# Patient Record
Sex: Male | Born: 1945 | Race: White | Hispanic: No | Smoking: Current every day smoker
Health system: Southern US, Community
[De-identification: ages and names within clinical notes are randomized; demographics above are authoritative.]

## PROBLEM LIST (undated history)

## (undated) DIAGNOSIS — M1990 Unspecified osteoarthritis, unspecified site: Secondary | ICD-10-CM

## (undated) HISTORY — PX: SHOULDER SURGERY: SHX246

## (undated) HISTORY — PX: TONSILLECTOMY: SUR1361

---

## 2001-06-18 ENCOUNTER — Emergency Department (HOSPITAL_COMMUNITY): Admission: EM | Admit: 2001-06-18 | Discharge: 2001-06-18 | Payer: Self-pay | Admitting: Emergency Medicine

## 2011-01-09 ENCOUNTER — Encounter: Payer: Self-pay | Admitting: *Deleted

## 2011-01-09 ENCOUNTER — Emergency Department (HOSPITAL_COMMUNITY)
Admission: EM | Admit: 2011-01-09 | Discharge: 2011-01-09 | Disposition: A | Discharge status: Home / Self Care | Payer: Worker's Compensation | Attending: Emergency Medicine | Admitting: Emergency Medicine

## 2011-01-09 DIAGNOSIS — M545 Low back pain, unspecified: Secondary | ICD-10-CM | POA: Insufficient documentation

## 2011-01-09 DIAGNOSIS — S335XXA Sprain of ligaments of lumbar spine, initial encounter: Secondary | ICD-10-CM | POA: Insufficient documentation

## 2011-01-09 DIAGNOSIS — S39012A Strain of muscle, fascia and tendon of lower back, initial encounter: Secondary | ICD-10-CM

## 2011-01-09 DIAGNOSIS — X58XXXA Exposure to other specified factors, initial encounter: Secondary | ICD-10-CM | POA: Insufficient documentation

## 2011-01-09 LAB — URINALYSIS, ROUTINE W REFLEX MICROSCOPIC
Nitrite: NEGATIVE
Protein, ur: 30 mg/dL — AB
Urobilinogen, UA: 1 mg/dL (ref 0.0–1.0)

## 2011-01-09 MED ORDER — HYDROCODONE-ACETAMINOPHEN 5-325 MG PO TABS: 1.0000 | ORAL_TABLET | Freq: Four times a day (QID) | ORAL | Status: AC | PRN

## 2011-01-09 MED ORDER — CYCLOBENZAPRINE HCL 10 MG PO TABS: 10.0000 mg | ORAL_TABLET | Freq: Two times a day (BID) | ORAL | Status: AC | PRN

## 2011-01-09 NOTE — ED Provider Notes (Signed)
History   This chart was scribed for Shelda Jakes, MD by Clarita Crane. The patient was seen in room APFT21/APFT21 and the patient's care was started at 12:23pm.   CSN: [id number]  Arrival date & time 01/09/11  3664   First MD Initiated Contact with Patient 01/09/11 1048      Chief Complaint  Patient presents with  . Back Pain    (Consider location/radiation/quality/duration/timing/severity/associated sxs/prior treatment) HPI Benjamin Carlson is a 65 y.o. male who presents to the Emergency Department complaining of moderate lower back pain that has been constant the last few days and became worse this morning. Pt ranks pain 7/10 at worst.  Pt claims being ambulatory or sitting makes it worse and standing relives the pain. Pt denies pain radiating into lower extremities, abdominal pain, numbness in legs, and swelling. Pt confirms h/o chronic mild hip pain.   History reviewed. No pertinent past medical history.  Past Surgical History  Procedure Date  . Shoulder surgery     History reviewed. No pertinent family history.  History  Substance Use Topics  . Smoking status: Current Everyday Smoker -- 1.0 packs/day  . Smokeless tobacco: Not on file  . Alcohol Use: Yes     occasionally      Review of Systems  Constitutional: Negative for fever and chills.  HENT: Negative for rhinorrhea and neck pain.   Eyes: Negative for pain.  Respiratory: Negative for cough and shortness of breath.   Cardiovascular: Negative for chest pain.  Gastrointestinal: Negative for nausea, vomiting, abdominal pain and diarrhea.  Genitourinary: Negative for dysuria.  Musculoskeletal: Positive for back pain.  Skin: Negative for rash.  Neurological: Negative for dizziness and weakness.   10 Systems reviewed and are negative for acute change except as noted in the HPI.  Allergies  Review of patient's allergies indicates no known allergies.  Home Medications   Current Outpatient Rx  Name Route  Sig Dispense Refill  . NEPHRO-VITE 0.8 MG PO TABS Oral Take 0.8 mg by mouth at bedtime.      Marland Kitchen VITAMIN B-1 100 MG PO TABS Oral Take 100 mg by mouth daily.      . CYCLOBENZAPRINE HCL 10 MG PO TABS Oral Take 1 tablet (10 mg total) by mouth 2 (two) times daily as needed for muscle spasms. 20 tablet 0  . HYDROCODONE-ACETAMINOPHEN 5-325 MG PO TABS Oral Take 1-2 tablets by mouth every 6 (six) hours as needed for pain. 10 tablet 0    BP 146/72  Pulse 81  Temp(Src) 97.7 F (36.5 C) (Oral)  Resp 18  Ht 5\' 11"  (1.803 m)  Wt 195 lb (88.451 kg)  BMI 27.20 kg/m2  SpO2 100%  Physical Exam  Nursing note and vitals reviewed. Constitutional: He is oriented to person, place, and time. He appears well-developed and well-nourished. No distress.  HENT:  Head: Normocephalic and atraumatic.  Right Ear: External ear normal.  Left Ear: External ear normal.  Mouth/Throat: Oropharynx is clear and moist.  Eyes: Conjunctivae are normal. Pupils are equal, round, and reactive to light.  Neck: Neck supple.  Cardiovascular: Normal rate, regular rhythm and normal heart sounds.   Pulmonary/Chest: Effort normal and breath sounds normal.  Abdominal: Soft. Bowel sounds are normal. There is no tenderness.  Musculoskeletal: Normal range of motion. He exhibits no tenderness.       Paraspinal muscles non-tender.   Neurological: He is alert and oriented to person, place, and time. No cranial nerve deficit. He exhibits normal muscle  tone. Coordination normal.  Skin: Skin is warm and dry.  Psychiatric: He has a normal mood and affect. His behavior is normal.    ED Course  Procedures (including critical care time)  DIAGNOSTIC STUDIES: Oxygen Saturation is 100% on RA, nml by my interpretation.    COORDINATION OF CARE:    Labs Reviewed  URINALYSIS, ROUTINE W REFLEX MICROSCOPIC - Abnormal; Notable for the following:    Specific Gravity, Urine >1.030 (*)    Bilirubin Urine MODERATE (*)    Ketones, ur TRACE (*)     Protein, ur 30 (*)    All other components within normal limits  URINE MICROSCOPIC-ADD ON   Results for orders placed during the hospital encounter of 01/09/11  URINALYSIS, ROUTINE W REFLEX MICROSCOPIC      Component Value Range   Color, Urine YELLOW  YELLOW    APPearance CLEAR  CLEAR    Specific Gravity, Urine >1.030 (*) 1.005 - 1.030    pH 6.0  5.0 - 8.0    Glucose, UA NEGATIVE  NEGATIVE (mg/dL)   Hgb urine dipstick NEGATIVE  NEGATIVE    Bilirubin Urine MODERATE (*) NEGATIVE    Ketones, ur TRACE (*) NEGATIVE (mg/dL)   Protein, ur 30 (*) NEGATIVE (mg/dL)   Urobilinogen, UA 1.0  0.0 - 1.0 (mg/dL)   Nitrite NEGATIVE  NEGATIVE    Leukocytes, UA NEGATIVE  NEGATIVE   URINE MICROSCOPIC-ADD ON      Component Value Range   WBC, UA 0-2  <3 (WBC/hpf)   RBC / HPF 0-2  <3 (RBC/hpf)   Urine-Other MUCOUS PRESENT       1. Lumbar strain       MDM   Symptoms consistent with mild lumbar strain low back pain. No lower trimming the focal deficits no radiation of pain into legs. No history of similar problem. No acute injury.  I personally performed the services described in this documentation, which was scribed in my presence. The recorded information has been reviewed and considered.     Shelda Jakes, MD 01/09/11 [phone]

## 2011-01-09 NOTE — ED Notes (Signed)
Pt c/o lower back pain x 2 days. Denies injury or Urinary symptoms.

## 2011-06-25 ENCOUNTER — Encounter (HOSPITAL_COMMUNITY): Payer: Self-pay | Admitting: *Deleted

## 2011-06-25 ENCOUNTER — Emergency Department (HOSPITAL_COMMUNITY)
Admission: EM | Admit: 2011-06-25 | Discharge: 2011-06-25 | Disposition: A | Discharge status: Home / Self Care | Payer: Self-pay | Attending: Emergency Medicine | Admitting: Emergency Medicine

## 2011-06-25 ENCOUNTER — Emergency Department (HOSPITAL_COMMUNITY): Payer: Self-pay

## 2011-06-25 DIAGNOSIS — S61509A Unspecified open wound of unspecified wrist, initial encounter: Secondary | ICD-10-CM | POA: Insufficient documentation

## 2011-06-25 DIAGNOSIS — Y93H9 Activity, other involving exterior property and land maintenance, building and construction: Secondary | ICD-10-CM | POA: Insufficient documentation

## 2011-06-25 DIAGNOSIS — W268XXA Contact with other sharp object(s), not elsewhere classified, initial encounter: Secondary | ICD-10-CM | POA: Insufficient documentation

## 2011-06-25 DIAGNOSIS — Y998 Other external cause status: Secondary | ICD-10-CM | POA: Insufficient documentation

## 2011-06-25 DIAGNOSIS — S51819A Laceration without foreign body of unspecified forearm, initial encounter: Secondary | ICD-10-CM

## 2011-06-25 DIAGNOSIS — F17200 Nicotine dependence, unspecified, uncomplicated: Secondary | ICD-10-CM | POA: Insufficient documentation

## 2011-06-25 MED ORDER — CEPHALEXIN 500 MG PO CAPS
500.0000 mg | ORAL_CAPSULE | Freq: Once | ORAL | Status: AC
  Administered 2011-06-25: 500 mg via ORAL
  Filled 2011-06-25: qty 1

## 2011-06-25 MED ORDER — HYDROCODONE-ACETAMINOPHEN 5-325 MG PO TABS: ORAL_TABLET | ORAL | Status: AC

## 2011-06-25 MED ORDER — CEPHALEXIN 500 MG PO CAPS: 500.0000 mg | ORAL_CAPSULE | Freq: Four times a day (QID) | ORAL | Status: AC

## 2011-06-25 MED ORDER — TETANUS-DIPHTH-ACELL PERTUSSIS 5-2.5-18.5 LF-MCG/0.5 IM SUSP
0.5000 mL | Freq: Once | INTRAMUSCULAR | Status: AC
  Administered 2011-06-25: 0.5 mL via INTRAMUSCULAR
  Filled 2011-06-25: qty 0.5

## 2011-06-25 MED ORDER — LIDOCAINE HCL (PF) 1 % IJ SOLN
5.0000 mL | Freq: Once | INTRAMUSCULAR | Status: AC
  Administered 2011-06-25: 5 mL via INTRADERMAL

## 2011-06-25 MED ORDER — LIDOCAINE HCL (PF) 1 % IJ SOLN
INTRAMUSCULAR | Status: AC
  Administered 2011-06-25: 5 mL via INTRADERMAL
  Filled 2011-06-25: qty 5

## 2011-06-25 NOTE — ED Notes (Signed)
Cleaned wound with wound cleanser.  Bleeding controlled.  nad noted.

## 2011-06-25 NOTE — ED Notes (Signed)
Pt states that lawn mower fell on his arm, large gash to right forearm, bleeding controlled at this time

## 2011-06-25 NOTE — ED Provider Notes (Signed)
History     CSN: [id number]  Arrival date & time 06/25/11  1250   First MD Initiated Contact with Patient 06/25/11 1336      Chief Complaint  Patient presents with  . Extremity Laceration    (Consider location/radiation/quality/duration/timing/severity/associated sxs/prior treatment) HPI Comments: Patient c/o laceration to his right forearm that occurred when his lawn mower turned over and the gear knob lacerated his arm.  Mower was not turned on at the time of the incident.  Patient denies other injuries, numbness of his arm or fingers.  Also denies weakness of the fingers.  Unsure of last tetanus injection  Patient is a 66 y.o. male presenting with skin laceration. The history is provided by the patient.  Laceration  The incident occurred 1 to 2 hours ago. The laceration is located on the right arm. The laceration is 4 cm in size. The laceration mechanism was a a metal edge. The pain is mild. The pain has been constant since onset. He reports no foreign bodies present. His tetanus status is unknown.    History reviewed. No pertinent past medical history.  Past Surgical History  Procedure Date  . Shoulder surgery     History reviewed. No pertinent family history.  History  Substance Use Topics  . Smoking status: Current Everyday Smoker -- 1.0 packs/day  . Smokeless tobacco: Not on file  . Alcohol Use: Yes     occasionally      Review of Systems  Constitutional: Negative for fever and chills.  Respiratory: Negative for shortness of breath.   Cardiovascular: Negative for chest pain.  Gastrointestinal: Negative for abdominal pain.  Genitourinary: Negative for dysuria and difficulty urinating.  Musculoskeletal: Negative for joint swelling and arthralgias.  Skin: Positive for wound. Negative for color change.  Neurological: Negative for weakness and numbness.  All other systems reviewed and are negative.    Allergies  Review of patient's allergies indicates no known  allergies.  Home Medications   Current Outpatient Rx  Name Route Sig Dispense Refill  . VITAMIN C PO Oral Take 1 tablet by mouth daily.    . ASPIRIN EC 81 MG PO TBEC Oral Take 81 mg by mouth daily.    Marland Kitchen NEPHRO-VITE 0.8 MG PO TABS Oral Take 0.8 mg by mouth at bedtime.      Marland Kitchen VITAMIN D PO Oral Take 2 tablets by mouth daily.    . IBUPROFEN 200 MG PO TABS Oral Take 400-600 mg by mouth every 6 (six) hours as needed. For pain    . ADULT MULTIVITAMIN W/MINERALS CH Oral Take 1 tablet by mouth daily.    Marland Kitchen JOINT FORMULA PO Oral Take 2 tablets by mouth daily.    Marland Kitchen VITAMIN B-1 100 MG PO TABS Oral Take 100 mg by mouth daily.        BP 133/77  Pulse 88  Temp(Src) 97.9 F (36.6 C) (Oral)  Resp 18  Ht 5\' 9"  (1.753 m)  Wt 190 lb (86.183 kg)  BMI 28.06 kg/m2  SpO2 97%  Physical Exam  Nursing note and vitals reviewed. Constitutional: He is oriented to person, place, and time. He appears well-developed and well-nourished. No distress.  HENT:  Head: Normocephalic and atraumatic.  Cardiovascular: Normal rate, regular rhythm and normal heart sounds.   Pulmonary/Chest: Effort normal and breath sounds normal.  Musculoskeletal: He exhibits tenderness. He exhibits no edema.       Right forearm: He exhibits tenderness and laceration. He exhibits no bony tenderness, no swelling,  no edema and no deformity.       Arms:      Pt has full ROM of the wrist and all fingers of the right hand.  distal sensation intact, radial pulse is brisk.  CR<2 sec. Grip strength is strong and equal.  Finger/thumb opposition is intact  Neurological: He is alert and oriented to person, place, and time. He exhibits normal muscle tone. Coordination normal.    ED Course  Procedures (including critical care time)  Labs Reviewed - No data to display Dg Forearm Right  06/25/2011  *RADIOLOGY REPORT*  Clinical Data: Laceration  RIGHT FOREARM - 2 VIEW  Comparison: None.  Findings: There is a soft tissue laceration with some venous  gas along the ulnar and volar side of the mid forearm.  The radius and ulna are intact.  No acute or healing fracture.  IMPRESSION: Soft tissue laceration of the forearm.  No acute bony abnormality.  Original Report Authenticated By: Britta Mccreedy, M.D.        MDM    Consulted Dr. Hilda Lias, will see in his office tomorrow am  LACERATION REPAIR Performed by: Noraa Pickeral L. Authorized by: Maxwell Caul Consent: Verbal consent obtained. Risks and benefits: risks, benefits and alternatives were discussed Consent given by: patient Patient identity confirmed: provided demographic data Prepped and Draped in normal sterile fashion Wound explored  Laceration Location: volar surface of the right forearm Laceration Length: 4 cm  No Foreign Bodies seen or palpated  Anesthesia: local infiltration  Local anesthetic: lidocaine 1% w/o epinephrine  Anesthetic total: 4 ml  Irrigation method: syringe Amount of cleaning: standard  Skin closure: 4-0 ethilon Number of sutures: 3, closed loosely Technique:simple interrupted   Patient tolerance: Patient tolerated the procedure well with no immediate complications.    Wound(s) explored with adequate hemostasis through ROM, no apparent gross foreign body retained, no significant involvement of deep structures such as bone / joint / tendon / or neurovascular involvement noted.  Baseline Strength and Sensation to affected extremity(ies) with normal light touch for Pt, distal NVI with CR< 2 secs and pulse(s) intact to affected extremity(ies).   Patient / Family / Caregiver understand and agree with initial ED impression and plan with expectations set for ED visit. Pt stable in ED with no significant deterioration in condition. Pt feels improved after observation and/or treatment in ED.     Prescribed:  Norco#20 Keflex   Ralston Venus L. Declin Rajan, [place] 06/30/11 1327

## 2011-07-01 NOTE — ED Provider Notes (Signed)
Medical screening examination/treatment/procedure(s) were performed by non-physician practitioner and as supervising physician I was immediately available for consultation/collaboration.  Gabriell Daigneault, MD 07/01/11 1314 

## 2012-05-09 ENCOUNTER — Telehealth: Payer: Self-pay

## 2012-05-09 NOTE — Telephone Encounter (Signed)
Pt was referred by Dr. Regino Schultze for screening colonoscopy. Called, could not leave VM, not set up.

## 2012-05-21 ENCOUNTER — Other Ambulatory Visit: Payer: Self-pay

## 2012-05-21 ENCOUNTER — Telehealth: Payer: Self-pay

## 2012-05-21 DIAGNOSIS — Z1211 Encounter for screening for malignant neoplasm of colon: Secondary | ICD-10-CM

## 2012-05-21 NOTE — Telephone Encounter (Signed)
Called pt and he said that he will call me back shortly.

## 2012-05-22 NOTE — Telephone Encounter (Signed)
OK to schedule

## 2012-05-22 NOTE — Telephone Encounter (Signed)
See triage dated 05/21/2012.

## 2012-05-22 NOTE — Telephone Encounter (Signed)
Gastroenterology Pre-Procedure Form     Request Date: 05/21/2012      Requesting Physician: Dr. Regino Schultze      PATIENT INFORMATION:  Benjamin Carlson is a 67 y.o., male (DOB=1945/04/02).  PROCEDURE: Procedure(s) requested: colonoscopy Procedure Reason: screening for colon cancer  PATIENT REVIEW QUESTIONS: The patient reports the following:   1. Diabetes Melitis: no 2. Joint replacements in the past 12 months: no 3. Major health problems in the past 3 months: no 4. Has an artificial valve or MVP:no 5. Has been advised in past to take antibiotics in advance of a procedure like teeth cleaning: no}    MEDICATIONS & ALLERGIES:    Patient reports the following regarding taking any blood thinners:   Plavix? no Aspirin? YES Coumadin?  no  Patient confirms/reports the following medications:  Current Outpatient Prescriptions  Medication Sig Dispense Refill  . Ascorbic Acid (VITAMIN C PO) Take 1,000 mg by mouth daily. Takes two tablets daily      . aspirin EC 81 MG tablet Take 81 mg by mouth daily.      Marland Kitchen buPROPion (WELLBUTRIN SR) 100 MG 12 hr tablet Take 100 mg by mouth 2 (two) times daily.      Marland Kitchen ibuprofen (ADVIL,MOTRIN) 200 MG tablet Take 400-600 mg by mouth every 6 (six) hours as needed. For pain      . Multiple Vitamin (MULTIVITAMIN WITH MINERALS) TABS Take 1 tablet by mouth daily.      . naproxen (NAPROSYN) 500 MG tablet Take 500 mg by mouth 2 (two) times daily with a meal.      . NON FORMULARY Vitamin D3  1000 mg   2 tablets daily      . vitamin B-12 (CYANOCOBALAMIN) 500 MCG tablet Take 500 mcg by mouth 2 (two) times daily.      . vitamin E 1000 UNIT capsule Take 1,000 Units by mouth 2 (two) times daily.      . Nutritional Supplements (JOINT FORMULA PO) Take 2 tablets by mouth daily.       No current facility-administered medications for this visit.    Patient confirms/reports the following allergies:  No Known Allergies  Patient is appropriate to schedule for requested  procedure(s): yes  AUTHORIZATION INFORMATION Primary Insurance:   ID #:  Group #:  Pre-Cert / Auth required: Pre-Cert / Auth #:  Secondary Insurance:   ID #:   Group #:  Pre-Cert / Auth required: Pre-Cert / Auth #:   No orders of the defined types were placed in this encounter.    SCHEDULE INFORMATION: Procedure has been scheduled as follows:  Date: 06/05/2012  , Time: 12:45 PM  Location: [facility] Short Stay  This Gastroenterology Pre-Precedure Form is being routed to the following provider(s) for review: R. Roetta Sessions, MD

## 2012-05-23 ENCOUNTER — Encounter (HOSPITAL_COMMUNITY): Payer: Self-pay | Admitting: Pharmacy Technician

## 2012-05-27 MED ORDER — PEG-KCL-NACL-NASULF-NA ASC-C 100 G PO SOLR: 1.0000 | ORAL | Status: DC

## 2012-05-27 NOTE — Telephone Encounter (Signed)
Rx sent to the pharmacy and instructions mailed to pt.  

## 2012-06-04 ENCOUNTER — Telehealth: Payer: Self-pay

## 2012-06-04 NOTE — Telephone Encounter (Signed)
pts girlfriend called- pt didn't follow prep instructions. He just ate a large meal of chicken, fish, french fries and slaw. They are wanting to reschedule his procedure. They have not mixed his prep yet. And are concerned that he wont get cleaned out. They prefer to just cancel and reschedule. Routing to DS to reschedule.  [phone]

## 2012-06-04 NOTE — Telephone Encounter (Signed)
Pt is rescheduled for 06/06/2012 at 2:45 and will be at the hospital at 1:45 PM to register. LMOM for Kim .

## 2012-06-05 MED ORDER — SODIUM CHLORIDE 0.9 % IV SOLN
INTRAVENOUS | Status: DC
  Administered 2012-06-06: 1000 mL via INTRAVENOUS

## 2012-06-06 ENCOUNTER — Ambulatory Visit (HOSPITAL_COMMUNITY)
Admission: RE | Admit: 2012-06-06 | Discharge: 2012-06-06 | Disposition: A | Discharge status: Home / Self Care | Payer: Medicare Other | Source: Ambulatory Visit | Attending: Internal Medicine | Admitting: Internal Medicine

## 2012-06-06 ENCOUNTER — Encounter (HOSPITAL_COMMUNITY)
Admission: RE | Disposition: A | Discharge status: Home / Self Care | Payer: Self-pay | Source: Ambulatory Visit | Attending: Internal Medicine

## 2012-06-06 ENCOUNTER — Encounter (HOSPITAL_COMMUNITY): Payer: Self-pay | Admitting: *Deleted

## 2012-06-06 DIAGNOSIS — D126 Benign neoplasm of colon, unspecified: Secondary | ICD-10-CM | POA: Insufficient documentation

## 2012-06-06 DIAGNOSIS — K621 Rectal polyp: Secondary | ICD-10-CM

## 2012-06-06 DIAGNOSIS — D129 Benign neoplasm of anus and anal canal: Secondary | ICD-10-CM | POA: Diagnosis not present

## 2012-06-06 DIAGNOSIS — Z1211 Encounter for screening for malignant neoplasm of colon: Secondary | ICD-10-CM | POA: Diagnosis not present

## 2012-06-06 DIAGNOSIS — M129 Arthropathy, unspecified: Secondary | ICD-10-CM | POA: Insufficient documentation

## 2012-06-06 DIAGNOSIS — Z79899 Other long term (current) drug therapy: Secondary | ICD-10-CM | POA: Insufficient documentation

## 2012-06-06 DIAGNOSIS — Z791 Long term (current) use of non-steroidal anti-inflammatories (NSAID): Secondary | ICD-10-CM | POA: Diagnosis not present

## 2012-06-06 DIAGNOSIS — Z7982 Long term (current) use of aspirin: Secondary | ICD-10-CM | POA: Insufficient documentation

## 2012-06-06 DIAGNOSIS — K5730 Diverticulosis of large intestine without perforation or abscess without bleeding: Secondary | ICD-10-CM

## 2012-06-06 DIAGNOSIS — K620 Anal polyp: Secondary | ICD-10-CM | POA: Diagnosis not present

## 2012-06-06 DIAGNOSIS — F17200 Nicotine dependence, unspecified, uncomplicated: Secondary | ICD-10-CM | POA: Insufficient documentation

## 2012-06-06 HISTORY — DX: Unspecified osteoarthritis, unspecified site: M19.90

## 2012-06-06 HISTORY — PX: COLONOSCOPY: SHX5424

## 2012-06-06 SURGERY — COLONOSCOPY
Anesthesia: Moderate Sedation

## 2012-06-06 MED ORDER — MIDAZOLAM HCL 5 MG/5ML IJ SOLN
INTRAMUSCULAR | Status: DC | PRN
  Administered 2012-06-06: 2 mg via INTRAVENOUS
  Administered 2012-06-06: 1 mg via INTRAVENOUS
  Administered 2012-06-06: 2 mg via INTRAVENOUS

## 2012-06-06 MED ORDER — STERILE WATER FOR IRRIGATION IR SOLN
Status: DC | PRN
  Administered 2012-06-06: 15:00:00

## 2012-06-06 MED ORDER — ONDANSETRON HCL 4 MG/2ML IJ SOLN
INTRAMUSCULAR | Status: AC
  Filled 2012-06-06: qty 2

## 2012-06-06 MED ORDER — ONDANSETRON HCL 4 MG/2ML IJ SOLN
INTRAMUSCULAR | Status: DC | PRN
  Administered 2012-06-06: 4 mg via INTRAVENOUS

## 2012-06-06 MED ORDER — MEPERIDINE HCL 100 MG/ML IJ SOLN
INTRAMUSCULAR | Status: AC
  Filled 2012-06-06: qty 1

## 2012-06-06 MED ORDER — MEPERIDINE HCL 100 MG/ML IJ SOLN
INTRAMUSCULAR | Status: DC | PRN
  Administered 2012-06-06 (×2): 50 mg via INTRAVENOUS

## 2012-06-06 MED ORDER — MIDAZOLAM HCL 5 MG/5ML IJ SOLN
INTRAMUSCULAR | Status: AC
  Filled 2012-06-06: qty 10

## 2012-06-06 NOTE — Op Note (Signed)
[facility] [address] [place], [address]   COLONOSCOPY PROCEDURE REPORT  PATIENT: Benjamin Carlson, Benjamin Carlson  MR#:         [id number] BIRTHDATE: 12/17/1945 , 66  yrs. old GENDER: Male ENDOSCOPIST: R.  Roetta Sessions, MD FACP FACG REFERRED BY:  Karleen Hampshire, M.D. PROCEDURE DATE:  06/06/2012 PROCEDURE:     Colonoscopy with biopsy and snare polypectomy  INDICATIONS: First ever average risk colorectal cancer screening examination  INFORMED CONSENT:  The risks, benefits, alternatives and imponderables including but not limited to bleeding, perforation as well as the possibility of a missed lesion have been reviewed.  The potential for biopsy, lesion removal, etc. have also been discussed.  Questions have been answered.  All parties agreeable. Please see the history and physical in the medical record for more information.  MEDICATIONS: Versed 5 mg IV and Demerol 100 mg IV in divided doses. Zofran 4 mg  DESCRIPTION OF PROCEDURE:  After a digital rectal exam was performed, the EC-3890Li ([id number])  colonoscope was advanced from the anus through the rectum and colon to the area of the cecum, ileocecal valve and appendiceal orifice.  The cecum was deeply intubated.  These structures were well-seen and photographed for the record.  From the level of the cecum and ileocecal valve, the scope was slowly and cautiously withdrawn.  The mucosal surfaces were carefully surveyed utilizing scope tip deflection to facilitate fold flattening as needed.  The scope was pulled down into the rectum where a thorough examination including retroflexion was performed.    FINDINGS:  Adequate preparation. Single diminutive polyp in the rectum at 10 cm from the anal verge; otherwise, the remainder of colonic mucosa appeared normal. Patient few scattered left-sided diverticula; (1) 6 mm pedunculated polyp at the splenic flexure; otherwise, the remainder of colonic mucosa appeared  normal.  THERAPEUTIC / DIAGNOSTIC MANEUVERS PERFORMED:  The above-mentioned polyps were cold biopsied and hot snare/removed, respectively.  COMPLICATIONS: None  CECAL WITHDRAWAL TIME:  9 minutes  IMPRESSION:  Colonic diverticulosis. Colonic polyps-removed as described above  RECOMMENDATIONS: followup on pathology.   _______________________________ eSigned:  R. Roetta Sessions, MD FACP [facility] 06/06/2012 3:52 PM   CC:    PATIENT NAME:  Massie, Mees MR#: [id number]

## 2012-06-06 NOTE — H&P (Signed)
Primary Care Physician:  Kirk Ruths, MD Primary Gastroenterologist:  Dr. Jena Gauss  Pre-Procedure History & Physical: HPI:  Benjamin Carlson is a 67 y.o. male is here for a screening colonoscopy. No bowel symptoms. First screening examination. No family history of colon polyps or colon cancer  Past Medical History  Diagnosis Date  . Arthritis     Past Surgical History  Procedure Laterality Date  . Shoulder surgery    . Tonsillectomy      age 67    Prior to Admission medications   Medication Sig Start Date End Date Taking? Authorizing Provider  acetaminophen (TYLENOL) 500 MG tablet Take 1,000 mg by mouth every 6 (six) hours as needed for pain.   Yes Historical Provider, MD  Ascorbic Acid (VITAMIN C PO) Take 1,000 mg by mouth daily. Takes two tablets daily   Yes Historical Provider, MD  aspirin EC 81 MG tablet Take 81 mg by mouth daily.   Yes Historical Provider, MD  buPROPion (WELLBUTRIN SR) 100 MG 12 hr tablet Take 100 mg by mouth 2 (two) times daily.   Yes Historical Provider, MD  ibuprofen (ADVIL,MOTRIN) 200 MG tablet Take 400-600 mg by mouth every 6 (six) hours as needed. For pain   Yes Historical Provider, MD  Multiple Vitamin (MULTIVITAMIN WITH MINERALS) TABS Take 1 tablet by mouth daily.   Yes Historical Provider, MD  naproxen (NAPROSYN) 500 MG tablet Take 500 mg by mouth 2 (two) times daily with a meal.   Yes Historical Provider, MD  NON FORMULARY Vitamin D3  1000 mg   2 tablets daily   Yes Historical Provider, MD  Nutritional Supplements (JOINT FORMULA PO) Take 2 tablets by mouth daily.   Yes Historical Provider, MD  peg 3350 powder (MOVIPREP) 100 G SOLR Take 1 kit (100 g total) by mouth as directed. 05/27/12  Yes Corbin Ade, MD  vitamin B-12 (CYANOCOBALAMIN) 500 MCG tablet Take 500 mcg by mouth 2 (two) times daily.   Yes Historical Provider, MD  vitamin E 1000 UNIT capsule Take 1,000 Units by mouth 2 (two) times daily.   Yes Historical Provider, MD    Allergies as of  05/21/2012  . (No Known Allergies)    Family History  Problem Relation Age of Onset  . Breast cancer Mother   . Cancer - Other Father   . Lung cancer Sister   . Lung cancer Brother   . Cancer - Other Brother     History   Social History  . Marital Status: Single    Spouse Name: N/A    Number of Children: N/A  . Years of Education: N/A   Occupational History  . Not on file.   Social History Main Topics  . Smoking status: Current Every Day Smoker -- 1.00 packs/day    Types: Cigarettes  . Smokeless tobacco: Not on file  . Alcohol Use: Yes     Comment: occasionally  . Drug Use: No  . Sexually Active: Not on file   Other Topics Concern  . Not on file   Social History Narrative  . No narrative on file    Review of Systems: See HPI, otherwise negative ROS  Physical Exam: BP 122/70  Pulse 81  Temp(Src) 97.9 F (36.6 C) (Oral)  Resp 18  Ht 5\' 9"  (1.753 m)  Wt 200 lb (90.719 kg)  BMI 29.52 kg/m2  SpO2 96% General:   Alert,  Well-developed, well-nourished, pleasant and cooperative in NAD Head:  Normocephalic and atraumatic.  Eyes:  Sclera clear, no icterus.   Conjunctiva pink. Ears:  Normal auditory acuity. Nose:  No deformity, discharge,  or lesions. Mouth:  No deformity or lesions, dentition normal. Neck:  Supple; no masses or thyromegaly. Lungs:  Clear throughout to auscultation.   No wheezes, crackles, or rhonchi. No acute distress. Heart:  Regular rate and rhythm; no murmurs, clicks, rubs,  or gallops. Abdomen:  Soft, nontender and nondistended. No masses, hepatosplenomegaly or hernias noted. Normal bowel sounds, without guarding, and without rebound.   Msk:  Symmetrical without gross deformities. Normal posture. Pulses:  Normal pulses noted. Extremities:  Without clubbing or edema. Neurologic:  Alert and  oriented x4;  grossly normal neurologically. Skin:  Intact without significant lesions or rashes. Cervical Nodes:  No significant cervical  adenopathy. Psych:  Alert and cooperative. Normal mood and affect.  Impression/Plan: Benjamin Carlson is now here to undergo a screening colonoscopy.   First ever average risk screening examination.  Risks, benefits, limitations, imponderables and alternatives regarding colonoscopy have been reviewed with the patient. Questions have been answered. All parties agreeable.

## 2012-06-11 ENCOUNTER — Encounter: Payer: Self-pay | Admitting: Internal Medicine

## 2012-06-11 ENCOUNTER — Encounter (HOSPITAL_COMMUNITY): Payer: Self-pay | Admitting: Internal Medicine

## 2012-08-12 DIAGNOSIS — Z6826 Body mass index (BMI) 26.0-26.9, adult: Secondary | ICD-10-CM | POA: Diagnosis not present

## 2012-08-12 DIAGNOSIS — G4700 Insomnia, unspecified: Secondary | ICD-10-CM | POA: Diagnosis not present

## 2012-08-12 DIAGNOSIS — F528 Other sexual dysfunction not due to a substance or known physiological condition: Secondary | ICD-10-CM | POA: Diagnosis not present

## 2012-08-12 DIAGNOSIS — Z23 Encounter for immunization: Secondary | ICD-10-CM | POA: Diagnosis not present

## 2012-11-06 DIAGNOSIS — K460 Unspecified abdominal hernia with obstruction, without gangrene: Secondary | ICD-10-CM | POA: Diagnosis not present

## 2012-11-06 DIAGNOSIS — Z23 Encounter for immunization: Secondary | ICD-10-CM | POA: Diagnosis not present

## 2012-11-06 DIAGNOSIS — Z6827 Body mass index (BMI) 27.0-27.9, adult: Secondary | ICD-10-CM | POA: Diagnosis not present

## 2012-11-06 DIAGNOSIS — G589 Mononeuropathy, unspecified: Secondary | ICD-10-CM | POA: Diagnosis not present

## 2012-11-12 ENCOUNTER — Other Ambulatory Visit (HOSPITAL_COMMUNITY): Payer: Self-pay | Admitting: Family Medicine

## 2012-11-12 DIAGNOSIS — K460 Unspecified abdominal hernia with obstruction, without gangrene: Secondary | ICD-10-CM

## 2012-11-14 ENCOUNTER — Ambulatory Visit (HOSPITAL_COMMUNITY)
Admission: RE | Admit: 2012-11-14 | Discharge: 2012-11-14 | Disposition: A | Discharge status: Home / Self Care | Payer: Medicare Other | Source: Ambulatory Visit | Attending: Family Medicine | Admitting: Family Medicine

## 2012-11-14 DIAGNOSIS — K5730 Diverticulosis of large intestine without perforation or abscess without bleeding: Secondary | ICD-10-CM | POA: Insufficient documentation

## 2012-11-14 DIAGNOSIS — K460 Unspecified abdominal hernia with obstruction, without gangrene: Secondary | ICD-10-CM

## 2012-11-14 DIAGNOSIS — K458 Other specified abdominal hernia without obstruction or gangrene: Secondary | ICD-10-CM | POA: Diagnosis not present

## 2012-11-25 DIAGNOSIS — H00019 Hordeolum externum unspecified eye, unspecified eyelid: Secondary | ICD-10-CM | POA: Diagnosis not present

## 2012-12-04 ENCOUNTER — Encounter: Payer: Self-pay | Admitting: Internal Medicine

## 2012-12-05 ENCOUNTER — Ambulatory Visit: Payer: Self-pay | Admitting: Gastroenterology

## 2012-12-31 ENCOUNTER — Ambulatory Visit: Payer: Self-pay | Admitting: Gastroenterology

## 2013-02-03 ENCOUNTER — Encounter: Payer: Self-pay | Admitting: Gastroenterology

## 2013-02-03 ENCOUNTER — Ambulatory Visit: Payer: Self-pay | Admitting: Gastroenterology

## 2013-02-03 ENCOUNTER — Telehealth: Payer: Self-pay | Admitting: Gastroenterology

## 2013-02-03 NOTE — Telephone Encounter (Signed)
Mailed letter and PCP is aware °

## 2013-02-03 NOTE — Telephone Encounter (Signed)
Please make sure referring provider aware of no show.

## 2013-02-03 NOTE — Telephone Encounter (Signed)
Pt was a no show

## 2013-06-03 ENCOUNTER — Encounter (INDEPENDENT_AMBULATORY_CARE_PROVIDER_SITE_OTHER): Payer: Self-pay

## 2013-06-03 ENCOUNTER — Ambulatory Visit (INDEPENDENT_AMBULATORY_CARE_PROVIDER_SITE_OTHER): Payer: Medicare Other | Admitting: Gastroenterology

## 2013-06-03 ENCOUNTER — Other Ambulatory Visit: Payer: Self-pay | Admitting: Internal Medicine

## 2013-06-03 ENCOUNTER — Encounter (HOSPITAL_COMMUNITY): Payer: Self-pay | Admitting: Pharmacy Technician

## 2013-06-03 ENCOUNTER — Encounter: Payer: Self-pay | Admitting: Gastroenterology

## 2013-06-03 VITALS — BP 163/87 | HR 67 | Temp 97.2°F | Ht 70.0 in | Wt 198.4 lb

## 2013-06-03 DIAGNOSIS — R933 Abnormal findings on diagnostic imaging of other parts of digestive tract: Secondary | ICD-10-CM | POA: Diagnosis not present

## 2013-06-03 DIAGNOSIS — Z860101 Personal history of adenomatous and serrated colon polyps: Secondary | ICD-10-CM | POA: Insufficient documentation

## 2013-06-03 DIAGNOSIS — Z86010 Personal history of colonic polyps: Secondary | ICD-10-CM | POA: Insufficient documentation

## 2013-06-03 NOTE — Assessment & Plan Note (Signed)
Due next colonoscopy in 05/2017.

## 2013-06-03 NOTE — Patient Instructions (Signed)
Upper endoscopy as scheduled. See separate instructions.  Please follow up with your family doctor regarding left leg pain.

## 2013-06-03 NOTE — Progress Notes (Signed)
Primary Care Physician:  Leonides Grills, MD  Primary Gastroenterologist:  Garfield Cornea, MD   Chief Complaint  Patient presents with  . EGD    HPI:  Benjamin Carlson is a 68 y.o. male here for further evaluation of abnormal CT abdomen and pelvis. CT was performed due to concern for left inguinal hernia, ordered by PCP. There was question of wall thickening versus underdistention artifact at proximal stomach.  Complains of left thigh pain at time of rest, especially when he lays down at night. No heartburn, dysphagia, abdominal pain, nausea/vomiting, weight loss. Good appetite. Uses advil, naproxen prn.   Current Outpatient Prescriptions  Medication Sig Dispense Refill  . acetaminophen (TYLENOL) 500 MG tablet Take 1,000 mg by mouth every 6 (six) hours as needed for pain.      . Ascorbic Acid (VITAMIN C PO) Take 1,000 mg by mouth daily. Takes two tablets daily      . aspirin EC 81 MG tablet Take 81 mg by mouth daily.      Marland Kitchen ibuprofen (ADVIL,MOTRIN) 200 MG tablet Take 400-600 mg by mouth every 6 (six) hours as needed. For pain      . Multiple Vitamin (MULTIVITAMIN WITH MINERALS) TABS Take 1 tablet by mouth daily.      . NON FORMULARY Vitamin D3  1000 mg   2 tablets daily      . Nutritional Supplements (JOINT FORMULA PO) Take 2 tablets by mouth daily.      . vitamin B-12 (CYANOCOBALAMIN) 500 MCG tablet Take 500 mcg by mouth 2 (two) times daily.      . vitamin E 1000 UNIT capsule Take 1,000 Units by mouth 2 (two) times daily.       No current facility-administered medications for this visit.    Allergies as of 06/03/2013  . (No Known Allergies)    Past Medical History  Diagnosis Date  . Arthritis     Past Surgical History  Procedure Laterality Date  . Shoulder surgery    . Tonsillectomy      age 51  . Colonoscopy N/A 06/06/2012    ION:GEXBMWU diverticulosis. Colonic polyps, tubular adenoma, hyperplastic.Next TCS 05/2017.    Family History  Problem Relation Age of Onset  .  Breast cancer Mother   . Cancer - Other Father   . Lung cancer Sister   . Lung cancer Brother   . Cancer - Other Brother     History   Social History  . Marital Status: Single    Spouse Name: N/A    Number of Children: N/A  . Years of Education: N/A   Occupational History  . Not on file.   Social History Main Topics  . Smoking status: Current Every Day Smoker -- 1.00 packs/day    Types: Cigarettes  . Smokeless tobacco: Not on file  . Alcohol Use: Yes     Comment: occasionally  . Drug Use: No  . Sexual Activity: Not on file   Other Topics Concern  . Not on file   Social History Narrative  . No narrative on file      ROS:  General: Negative for anorexia, weight loss, fever, chills, fatigue, weakness. Eyes: Negative for vision changes.  ENT: Negative for hoarseness, difficulty swallowing , nasal congestion. CV: Negative for chest pain, angina, palpitations, dyspnea on exertion, peripheral edema.  Respiratory: Negative for dyspnea at rest, dyspnea on exertion, cough, sputum, wheezing.  GI: See history of present illness. GU:  Negative for dysuria, hematuria, urinary  incontinence, urinary frequency, nocturnal urination.  MS: Chronic joint pain. No back pain.   Derm: Negative for rash or itching.  Neuro: Negative for weakness, abnormal sensation, seizure, frequent headaches, memory loss, confusion.  Psych: Negative for anxiety, depression, suicidal ideation, hallucinations.  Endo: Negative for unusual weight change.  Heme: Negative for bruising or bleeding. Allergy: Negative for rash or hives.    Physical Examination:  BP 163/87  Pulse 67  Temp(Src) 97.2 F (36.2 C) (Oral)  Ht 5\' 10"  (1.778 m)  Wt 198 lb 6.4 oz (89.994 kg)  BMI 28.47 kg/m2   General: Well-nourished, well-developed in no acute distress.  Head: Normocephalic, atraumatic.   Eyes: Conjunctiva pink, no icterus. Mouth: Oropharyngeal mucosa moist and pink , no lesions erythema or exudate. Neck:  Supple without thyromegaly, masses, or lymphadenopathy.  Lungs: Clear to auscultation bilaterally.  Heart: Regular rate and rhythm, no murmurs rubs or gallops.  Abdomen: Bowel sounds are normal, nontender, nondistended, no hepatosplenomegaly or masses, no abdominal bruits or    hernia , no rebound or guarding.   Rectal: not performed Extremities: No lower extremity edema. No clubbing or deformities.  Neuro: Alert and oriented x 4 , grossly normal neurologically.  Skin: Warm and dry, no rash or jaundice.   Psych: Alert and cooperative, normal mood and affect.

## 2013-06-03 NOTE — Assessment & Plan Note (Signed)
68 year old gentleman who presents for further evaluation of abnormal stomach seen on CT scan in October 2014. He had to cancel and/or missed multiple appointments since that time but presents today for followup. CT scan was done for possible hernia by his PCP. There was question of wall thickening versus underdistention artifact at the proximal stomach, EGD recommended. Patient denies any GI symptoms.  I have discussed the risks, alternatives, benefits with regards to but not limited to the risk of reaction to medication, bleeding, infection, perforation and the patient is agreeable to proceed. Written consent to be obtained.

## 2013-06-03 NOTE — Progress Notes (Signed)
cc'd to pcp 

## 2013-06-16 ENCOUNTER — Encounter (HOSPITAL_COMMUNITY): Payer: Self-pay | Admitting: *Deleted

## 2013-06-16 ENCOUNTER — Encounter (HOSPITAL_COMMUNITY)
Admission: RE | Disposition: A | Discharge status: Home / Self Care | Payer: Self-pay | Source: Ambulatory Visit | Attending: Internal Medicine

## 2013-06-16 ENCOUNTER — Ambulatory Visit (HOSPITAL_COMMUNITY)
Admission: RE | Admit: 2013-06-16 | Discharge: 2013-06-16 | Disposition: A | Discharge status: Home / Self Care | Payer: Medicare Other | Source: Ambulatory Visit | Attending: Internal Medicine | Admitting: Internal Medicine

## 2013-06-16 DIAGNOSIS — R933 Abnormal findings on diagnostic imaging of other parts of digestive tract: Secondary | ICD-10-CM

## 2013-06-16 DIAGNOSIS — K2940 Chronic atrophic gastritis without bleeding: Secondary | ICD-10-CM | POA: Insufficient documentation

## 2013-06-16 DIAGNOSIS — F17200 Nicotine dependence, unspecified, uncomplicated: Secondary | ICD-10-CM | POA: Diagnosis not present

## 2013-06-16 DIAGNOSIS — K279 Peptic ulcer, site unspecified, unspecified as acute or chronic, without hemorrhage or perforation: Secondary | ICD-10-CM | POA: Diagnosis not present

## 2013-06-16 DIAGNOSIS — K2960 Other gastritis without bleeding: Secondary | ICD-10-CM | POA: Diagnosis not present

## 2013-06-16 DIAGNOSIS — K319 Disease of stomach and duodenum, unspecified: Secondary | ICD-10-CM | POA: Diagnosis not present

## 2013-06-16 DIAGNOSIS — K269 Duodenal ulcer, unspecified as acute or chronic, without hemorrhage or perforation: Secondary | ICD-10-CM | POA: Diagnosis not present

## 2013-06-16 DIAGNOSIS — Z7982 Long term (current) use of aspirin: Secondary | ICD-10-CM | POA: Insufficient documentation

## 2013-06-16 DIAGNOSIS — K2990 Gastroduodenitis, unspecified, without bleeding: Secondary | ICD-10-CM | POA: Diagnosis not present

## 2013-06-16 DIAGNOSIS — K259 Gastric ulcer, unspecified as acute or chronic, without hemorrhage or perforation: Secondary | ICD-10-CM

## 2013-06-16 DIAGNOSIS — K2970 Gastritis, unspecified, without bleeding: Secondary | ICD-10-CM | POA: Diagnosis not present

## 2013-06-16 HISTORY — PX: ESOPHAGOGASTRODUODENOSCOPY: SHX5428

## 2013-06-16 SURGERY — EGD (ESOPHAGOGASTRODUODENOSCOPY)
Anesthesia: Moderate Sedation

## 2013-06-16 MED ORDER — MIDAZOLAM HCL 5 MG/5ML IJ SOLN
INTRAMUSCULAR | Status: DC | PRN
  Administered 2013-06-16: 1 mg via INTRAVENOUS
  Administered 2013-06-16: 2 mg via INTRAVENOUS
  Administered 2013-06-16: 1 mg via INTRAVENOUS

## 2013-06-16 MED ORDER — ONDANSETRON HCL 4 MG/2ML IJ SOLN
INTRAMUSCULAR | Status: DC | PRN
  Administered 2013-06-16: 4 mg via INTRAVENOUS

## 2013-06-16 MED ORDER — LIDOCAINE VISCOUS 2 % MT SOLN
OROMUCOSAL | Status: AC
  Filled 2013-06-16: qty 15

## 2013-06-16 MED ORDER — SODIUM CHLORIDE 0.9 % IV SOLN
INTRAVENOUS | Status: DC
  Administered 2013-06-16: 14:00:00 via INTRAVENOUS

## 2013-06-16 MED ORDER — ONDANSETRON HCL 4 MG/2ML IJ SOLN
INTRAMUSCULAR | Status: AC
  Filled 2013-06-16: qty 2

## 2013-06-16 MED ORDER — MEPERIDINE HCL 100 MG/ML IJ SOLN
INTRAMUSCULAR | Status: DC | PRN
  Administered 2013-06-16: 25 mg via INTRAVENOUS
  Administered 2013-06-16: 50 mg via INTRAVENOUS

## 2013-06-16 MED ORDER — STERILE WATER FOR IRRIGATION IR SOLN
Status: DC | PRN
  Administered 2013-06-16: 14:00:00

## 2013-06-16 MED ORDER — MEPERIDINE HCL 100 MG/ML IJ SOLN
INTRAMUSCULAR | Status: AC
  Filled 2013-06-16: qty 2

## 2013-06-16 MED ORDER — MIDAZOLAM HCL 5 MG/5ML IJ SOLN
INTRAMUSCULAR | Status: AC
  Filled 2013-06-16: qty 10

## 2013-06-16 MED ORDER — LIDOCAINE VISCOUS 2 % MT SOLN
OROMUCOSAL | Status: DC | PRN
  Administered 2013-06-16: 20 mL via OROMUCOSAL

## 2013-06-16 NOTE — Discharge Instructions (Addendum)
EGD Discharge instructions Please read the instructions outlined below and refer to this sheet in the next few weeks. These discharge instructions provide you with general information on caring for yourself after you leave the hospital. Your doctor may also give you specific instructions. While your treatment has been planned according to the most current medical practices available, unavoidable complications occasionally occur. If you have any problems or questions after discharge, please call your doctor. ACTIVITY  You may resume your regular activity but move at a slower pace for the next 24 hours.   Take frequent rest periods for the next 24 hours.   Walking will help expel (get rid of) the air and reduce the bloated feeling in your abdomen.   No driving for 24 hours (because of the anesthesia (medicine) used during the test).   You may shower.   Do not sign any important legal documents or operate any machinery for 24 hours (because of the anesthesia used during the test).  NUTRITION  Drink plenty of fluids.   You may resume your normal diet.   Begin with a light meal and progress to your normal diet.   Avoid alcoholic beverages for 24 hours or as instructed by your caregiver.  MEDICATIONS  You may resume your normal medications unless your caregiver tells you otherwise.  WHAT YOU CAN EXPECT TODAY  You may experience abdominal discomfort such as a feeling of fullness or gas pains.  FOLLOW-UP  Your doctor will discuss the results of your test with you.  SEEK IMMEDIATE MEDICAL ATTENTION IF ANY OF THE FOLLOWING OCCUR:  Excessive nausea (feeling sick to your stomach) and/or vomiting.   Severe abdominal pain and distention (swelling).   Trouble swallowing.   Temperature over 101 F (37.8 C).   Rectal bleeding or vomiting of blood.     Peptic ulcer disease  Begin Protonix 40 mg daily  Avoid nonsteroidal drugs.  Further recommendations to follow pending review  of pathology report   Peptic Ulcer A peptic ulcer is a sore in the lining of in your esophagus (esophageal ulcer), stomach (gastric ulcer), or in the first part of your small intestine (duodenal ulcer). The ulcer causes erosion into the deeper tissue. CAUSES  Normally, the lining of the stomach and the small intestine protects itself from the acid that digests food. The protective lining can be damaged by: An infection caused by a bacterium called Helicobacter pylori (H. pylori). Regular use of nonsteroidal anti-inflammatory drugs (NSAIDs), such as ibuprofen or aspirin. Smoking tobacco. Other risk factors include being older than 53, drinking alcohol excessively, and having a family history of ulcer disease.  SYMPTOMS  Burning pain or gnawing in the area between the chest and the belly button. Heartburn. Nausea and vomiting. Bloating. The pain can be worse on an empty stomach and at night. If the ulcer results in bleeding, it can cause: Black, tarry stools. Vomiting of bright red blood. Vomiting of coffee ground looking materials. DIAGNOSIS  A diagnosis is usually made based upon your history and an exam. Other tests and procedures may be performed to find the cause of the ulcer. Finding a cause will help determine the best treatment. Tests and procedures may include: Blood tests, stool tests, or breath tests to check for the bacterium H. pylori. An upper gastrointestinal (GI) series of the esophagus, stomach, and small intestine. An endoscopy to examine the esophagus, stomach, and small intestine. A biopsy. TREATMENT  Treatment may include: Eliminating the cause of the ulcer, such as  smoking, NSAIDs, or alcohol. Medicines to reduce the amount of acid in your digestive tract. Antibiotic medicines if the ulcer is caused by the H. pylori bacterium. An upper endoscopy to treat a bleeding ulcer. Surgery if the bleeding is severe or if the ulcer created a hole somewhere in the digestive  system. HOME CARE INSTRUCTIONS  Avoid tobacco, alcohol, and caffeine. Smoking can increase the acid in the stomach, and continued smoking will impair the healing of ulcers. Avoid foods and drinks that seem to cause discomfort or aggravate your ulcer. Only take medicines as directed by your caregiver. Do not substitute over-the-counter medicines for prescription medicines without talking to your caregiver. Keep any follow-up appointments and tests as directed. SEEK MEDICAL CARE IF:  Your do not improve within 7 days of starting treatment. You have ongoing indigestion or heartburn. SEEK IMMEDIATE MEDICAL CARE IF:  You have sudden, sharp, or persistent abdominal pain. You have bloody or dark black, tarry stools. You vomit blood or vomit that looks like coffee grounds. You become light headed, weak, or feel faint. You become sweaty or clammy. MAKE SURE YOU:  Understand these instructions. Will watch your condition. Will get help right away if you are not doing well or get worse. Document Released: 12/31/1999 Document Revised: 09/27/2011 Document Reviewed: 08/02/2011 [facility] Patient Information 2014 [place].

## 2013-06-16 NOTE — Interval H&P Note (Signed)
History and Physical Interval Note:  06/16/2013 1:56 PM  Benjamin Carlson  has presented today for surgery, with the diagnosis of ABNORMAL STOMACH ON CT  The various methods of treatment have been discussed with the patient and family. After consideration of risks, benefits and other options for treatment, the patient has consented to  Procedure(s) with comments: ESOPHAGOGASTRODUODENOSCOPY (EGD) (N/A) - 11:30-rescheduled to [place] notified pt as a surgical intervention .  The patient's history has been reviewed, patient examined, no change in status, stable for surgery.  I have reviewed the patient's chart and labs.  Questions were answered to the patient's satisfaction.     No change. EGD per plan.The risks, benefits, limitations, alternatives and imponderables have been reviewed with the patient. Potential for esophageal dilation, biopsy, etc. have also been reviewed.  Questions have been answered. All parties agreeable.   Cristopher Estimable Marleen Moret

## 2013-06-16 NOTE — H&P (View-Only) (Signed)
Primary Care Physician:  Leonides Grills, MD  Primary Gastroenterologist:  Garfield Cornea, MD   Chief Complaint  Patient presents with  . EGD    HPI:  Benjamin Carlson is a 68 y.o. male here for further evaluation of abnormal CT abdomen and pelvis. CT was performed due to concern for left inguinal hernia, ordered by PCP. There was question of wall thickening versus underdistention artifact at proximal stomach.  Complains of left thigh pain at time of rest, especially when he lays down at night. No heartburn, dysphagia, abdominal pain, nausea/vomiting, weight loss. Good appetite. Uses advil, naproxen prn.   Current Outpatient Prescriptions  Medication Sig Dispense Refill  . acetaminophen (TYLENOL) 500 MG tablet Take 1,000 mg by mouth every 6 (six) hours as needed for pain.      . Ascorbic Acid (VITAMIN C PO) Take 1,000 mg by mouth daily. Takes two tablets daily      . aspirin EC 81 MG tablet Take 81 mg by mouth daily.      Marland Kitchen ibuprofen (ADVIL,MOTRIN) 200 MG tablet Take 400-600 mg by mouth every 6 (six) hours as needed. For pain      . Multiple Vitamin (MULTIVITAMIN WITH MINERALS) TABS Take 1 tablet by mouth daily.      . NON FORMULARY Vitamin D3  1000 mg   2 tablets daily      . Nutritional Supplements (JOINT FORMULA PO) Take 2 tablets by mouth daily.      . vitamin B-12 (CYANOCOBALAMIN) 500 MCG tablet Take 500 mcg by mouth 2 (two) times daily.      . vitamin E 1000 UNIT capsule Take 1,000 Units by mouth 2 (two) times daily.       No current facility-administered medications for this visit.    Allergies as of 06/03/2013  . (No Known Allergies)    Past Medical History  Diagnosis Date  . Arthritis     Past Surgical History  Procedure Laterality Date  . Shoulder surgery    . Tonsillectomy      age 51  . Colonoscopy N/A 06/06/2012    ION:GEXBMWU diverticulosis. Colonic polyps, tubular adenoma, hyperplastic.Next TCS 05/2017.    Family History  Problem Relation Age of Onset  .  Breast cancer Mother   . Cancer - Other Father   . Lung cancer Sister   . Lung cancer Brother   . Cancer - Other Brother     History   Social History  . Marital Status: Single    Spouse Name: N/A    Number of Children: N/A  . Years of Education: N/A   Occupational History  . Not on file.   Social History Main Topics  . Smoking status: Current Every Day Smoker -- 1.00 packs/day    Types: Cigarettes  . Smokeless tobacco: Not on file  . Alcohol Use: Yes     Comment: occasionally  . Drug Use: No  . Sexual Activity: Not on file   Other Topics Concern  . Not on file   Social History Narrative  . No narrative on file      ROS:  General: Negative for anorexia, weight loss, fever, chills, fatigue, weakness. Eyes: Negative for vision changes.  ENT: Negative for hoarseness, difficulty swallowing , nasal congestion. CV: Negative for chest pain, angina, palpitations, dyspnea on exertion, peripheral edema.  Respiratory: Negative for dyspnea at rest, dyspnea on exertion, cough, sputum, wheezing.  GI: See history of present illness. GU:  Negative for dysuria, hematuria, urinary  incontinence, urinary frequency, nocturnal urination.  MS: Chronic joint pain. No back pain.   Derm: Negative for rash or itching.  Neuro: Negative for weakness, abnormal sensation, seizure, frequent headaches, memory loss, confusion.  Psych: Negative for anxiety, depression, suicidal ideation, hallucinations.  Endo: Negative for unusual weight change.  Heme: Negative for bruising or bleeding. Allergy: Negative for rash or hives.    Physical Examination:  BP 163/87  Pulse 67  Temp(Src) 97.2 F (36.2 C) (Oral)  Ht 5\' 10"  (1.778 m)  Wt 198 lb 6.4 oz (89.994 kg)  BMI 28.47 kg/m2   General: Well-nourished, well-developed in no acute distress.  Head: Normocephalic, atraumatic.   Eyes: Conjunctiva pink, no icterus. Mouth: Oropharyngeal mucosa moist and pink , no lesions erythema or exudate. Neck:  Supple without thyromegaly, masses, or lymphadenopathy.  Lungs: Clear to auscultation bilaterally.  Heart: Regular rate and rhythm, no murmurs rubs or gallops.  Abdomen: Bowel sounds are normal, nontender, nondistended, no hepatosplenomegaly or masses, no abdominal bruits or    hernia , no rebound or guarding.   Rectal: not performed Extremities: No lower extremity edema. No clubbing or deformities.  Neuro: Alert and oriented x 4 , grossly normal neurologically.  Skin: Warm and dry, no rash or jaundice.   Psych: Alert and cooperative, normal mood and affect.

## 2013-06-16 NOTE — Op Note (Signed)
[facility] [address] [place], [address]   ENDOSCOPY PROCEDURE REPORT  PATIENT: Benjamin Carlson, Benjamin Carlson  MR#: [id number] BIRTHDATE: February 11, 1945 , 32  yrs. old GENDER: Male ENDOSCOPIST: R.  Garfield Cornea, MD FACP FACG REFERRED BY:  Elsie Lincoln, M.D. PROCEDURE DATE:  06/16/2013 PROCEDURE:     EGD with gastric biopsy  INDICATIONS:     Possible thickened stomach on CT  INFORMED CONSENT:   The risks, benefits, limitations, alternatives and imponderables have been discussed.  The potential for biopsy, esophogeal dilation, etc. have also been reviewed.  Questions have been answered.  All parties agreeable.  Please see the history and physical in the medical record for more information.  MEDICATIONS:    Versed 4 mg IV and 75 mg of Demerol in divided doses. Zofran 4 mg IV .  DESCRIPTION OF PROCEDURE:   The [id number] ([id number])  endoscope was introduced through the mouth and advanced to the second portion of the duodenum without difficulty or limitations.  The mucosal surfaces were surveyed very carefully during advancement of the scope and upon withdrawal.  Retroflexion view of the proximal stomach and esophagogastric junction was performed.      FINDINGS:  Normal esophagus.    Patient has mottling of the gastric mucosa with antral erosions. No gastric ulcer or infiltrating process observed. Pylorus patent and easily traversed examination bulb and second portion revealed 44 mm areas of bulbar ulceration. There was surrounding erosions as well. The second portion of the duodenum appeared normal.  THERAPEUTIC / DIAGNOSTIC MANEUVERS PERFORMED:  Biopsies abnormal gastric mucosa taken for histology   COMPLICATIONS:  None  IMPRESSION:   Gastric erosions; duodenal bulbar ulcer disease. Patient is asymptomatic. I doubt today findings would produce significant thickening of the gastric mucosa. More likely, stomach was  underdistended on the scan.   RECOMMENDATIONS:   Avoid nonsteroidal agents. Begin Protonix 40 mg daily. Followup on pathology.    _______________________________ R. Garfield Cornea, MD FACP [facility] eSigned:  R. Garfield Cornea, MD FACP [facility] 06/16/2013 2:26 PM     CC:  PATIENT NAME:  Benjamin Carlson, Benjamin Carlson MR#: [id number]

## 2013-06-18 ENCOUNTER — Encounter (HOSPITAL_COMMUNITY): Payer: Self-pay | Admitting: Internal Medicine

## 2013-06-19 ENCOUNTER — Encounter: Payer: Self-pay | Admitting: Internal Medicine

## 2013-06-23 DIAGNOSIS — Z6826 Body mass index (BMI) 26.0-26.9, adult: Secondary | ICD-10-CM | POA: Diagnosis not present

## 2013-06-23 DIAGNOSIS — K219 Gastro-esophageal reflux disease without esophagitis: Secondary | ICD-10-CM | POA: Diagnosis not present

## 2013-07-08 DIAGNOSIS — M25559 Pain in unspecified hip: Secondary | ICD-10-CM | POA: Diagnosis not present

## 2013-07-22 DIAGNOSIS — M25559 Pain in unspecified hip: Secondary | ICD-10-CM | POA: Diagnosis not present

## 2013-08-15 DIAGNOSIS — M25559 Pain in unspecified hip: Secondary | ICD-10-CM | POA: Diagnosis not present

## 2013-08-15 DIAGNOSIS — Z5189 Encounter for other specified aftercare: Secondary | ICD-10-CM | POA: Diagnosis not present

## 2013-08-15 DIAGNOSIS — M1610 Unilateral primary osteoarthritis, unspecified hip: Secondary | ICD-10-CM | POA: Diagnosis not present

## 2013-09-11 ENCOUNTER — Encounter: Payer: Self-pay | Admitting: Internal Medicine

## 2013-10-14 ENCOUNTER — Ambulatory Visit: Payer: Self-pay | Admitting: Internal Medicine

## 2013-10-14 ENCOUNTER — Encounter: Payer: Self-pay | Admitting: Internal Medicine

## 2014-02-05 ENCOUNTER — Other Ambulatory Visit: Payer: Self-pay

## 2014-02-05 MED ORDER — PANTOPRAZOLE SODIUM 40 MG PO TBEC: 40.0000 mg | DELAYED_RELEASE_TABLET | Freq: Every day | ORAL | Status: DC

## 2014-02-17 ENCOUNTER — Emergency Department (HOSPITAL_COMMUNITY)
Admission: EM | Admit: 2014-02-17 | Discharge: 2014-02-17 | Disposition: A | Discharge status: Home / Self Care | Payer: Worker's Compensation | Attending: Emergency Medicine | Admitting: Emergency Medicine

## 2014-02-17 ENCOUNTER — Encounter (HOSPITAL_COMMUNITY): Payer: Self-pay | Admitting: Emergency Medicine

## 2014-02-17 ENCOUNTER — Emergency Department (HOSPITAL_COMMUNITY): Payer: Worker's Compensation

## 2014-02-17 DIAGNOSIS — Y998 Other external cause status: Secondary | ICD-10-CM | POA: Diagnosis not present

## 2014-02-17 DIAGNOSIS — Z7982 Long term (current) use of aspirin: Secondary | ICD-10-CM | POA: Insufficient documentation

## 2014-02-17 DIAGNOSIS — Z720 Tobacco use: Secondary | ICD-10-CM | POA: Insufficient documentation

## 2014-02-17 DIAGNOSIS — W010XXA Fall on same level from slipping, tripping and stumbling without subsequent striking against object, initial encounter: Secondary | ICD-10-CM | POA: Diagnosis not present

## 2014-02-17 DIAGNOSIS — S20212A Contusion of left front wall of thorax, initial encounter: Secondary | ICD-10-CM

## 2014-02-17 DIAGNOSIS — S2020XA Contusion of thorax, unspecified, initial encounter: Secondary | ICD-10-CM | POA: Diagnosis not present

## 2014-02-17 DIAGNOSIS — S8992XA Unspecified injury of left lower leg, initial encounter: Secondary | ICD-10-CM | POA: Diagnosis not present

## 2014-02-17 DIAGNOSIS — Z79899 Other long term (current) drug therapy: Secondary | ICD-10-CM | POA: Insufficient documentation

## 2014-02-17 DIAGNOSIS — Y9289 Other specified places as the place of occurrence of the external cause: Secondary | ICD-10-CM | POA: Insufficient documentation

## 2014-02-17 DIAGNOSIS — Y9389 Activity, other specified: Secondary | ICD-10-CM | POA: Insufficient documentation

## 2014-02-17 DIAGNOSIS — M1990 Unspecified osteoarthritis, unspecified site: Secondary | ICD-10-CM | POA: Insufficient documentation

## 2014-02-17 DIAGNOSIS — S299XXA Unspecified injury of thorax, initial encounter: Secondary | ICD-10-CM | POA: Diagnosis present

## 2014-02-17 MED ORDER — OXYCODONE-ACETAMINOPHEN 5-325 MG PO TABS: 1.0000 | ORAL_TABLET | ORAL | Status: DC | PRN

## 2014-02-17 MED ORDER — IBUPROFEN 800 MG PO TABS
800.0000 mg | ORAL_TABLET | Freq: Once | ORAL | Status: AC
  Administered 2014-02-17: 800 mg via ORAL
  Filled 2014-02-17: qty 1

## 2014-02-17 MED ORDER — OXYCODONE-ACETAMINOPHEN 5-325 MG PO TABS
1.0000 | ORAL_TABLET | Freq: Once | ORAL | Status: AC
  Administered 2014-02-17: 1 via ORAL
  Filled 2014-02-17: qty 1

## 2014-02-17 NOTE — Discharge Instructions (Signed)
Rib Contusion °A rib contusion (bruise) can occur by a blow to the chest or by a fall against a hard object. Usually these will be much better in a couple weeks. If X-rays were taken today and there are no broken bones (fractures), the diagnosis of bruising is made. However, broken ribs may not show up for several days, or may be discovered later on a routine X-ray when signs of healing show up. If this happens to you, it does not mean that something was missed on the X-ray, but simply that it did not show up on the first X-rays. Earlier diagnosis will not usually change the treatment. °HOME CARE INSTRUCTIONS  °· Avoid strenuous activity. Be careful during activities and avoid bumping the injured ribs. Activities that pull on the injured ribs and cause pain should be avoided, if possible. °· For the first day or two, an ice pack used every 20 minutes while awake may be helpful. Put ice in a plastic bag and put a towel between the bag and the skin. °· Eat a normal, well-balanced diet. Drink plenty of fluids to avoid constipation. °· Take deep breaths several times a day to keep lungs free of infection. Try to cough several times a day. Splint the injured area with a pillow while coughing to ease pain. Coughing can help prevent pneumonia. °· Wear a rib belt or binder only if told to do so by your caregiver. If you are wearing a rib belt or binder, you must do the breathing exercises as directed by your caregiver. If not used properly, rib belts or binders restrict breathing which can lead to pneumonia. °· Only take over-the-counter or prescription medicines for pain, discomfort, or fever as directed by your caregiver. °SEEK MEDICAL CARE IF:  °· You or your child has an oral temperature above 102° F (38.9° C). °· Your baby is older than 3 months with a rectal temperature of 100.5° F (38.1° C) or higher for more than 1 day. °· You develop a cough, with thick or bloody sputum. °SEEK IMMEDIATE MEDICAL CARE IF:  °· You  have difficulty breathing. °· You feel sick to your stomach (nausea), have vomiting or belly (abdominal) pain. °· You have worsening pain, not controlled with medications, or there is a change in the location of the pain. °· You develop sweating or radiation of the pain into the arms, jaw or shoulders, or become light headed or faint. °· You or your child has an oral temperature above 102° F (38.9° C), not controlled by medicine. °· Your or your baby is older than 3 months with a rectal temperature of 102° F (38.9° C) or higher. °· Your baby is 3 months old or younger with a rectal temperature of 100.4° F (38° C) or higher. °MAKE SURE YOU:  °· Understand these instructions. °· Will watch your condition. °· Will get help right away if you are not doing well or get worse. °Document Released: 09/27/2000 Document Revised: 04/29/2012 Document Reviewed: 08/21/2007 °[employer]® Patient Information ©2015 [employer]. This information is not intended to replace advice given to you by your health care provider. Make sure you discuss any questions you have with your health care provider. ° °

## 2014-02-17 NOTE — ED Notes (Signed)
Patient verbalizes understanding of discharge instructions, prescription medications, home care and follow up care if needed. Patient ambulatory out of department at this time with family.
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
## 2014-02-17 NOTE — ED Provider Notes (Signed)
CSN: [id number]     Arrival date & time 02/17/14  1601 History   First MD Initiated Contact with Patient 02/17/14 1828     Chief Complaint  Patient presents with  . Fall     (Consider location/radiation/quality/duration/timing/severity/associated sxs/prior Treatment) HPI   Benjamin Carlson is a 69 y.o. male who presents to the Emergency Department complaining of left rib pain for three days.  He states the pain began after he tripped over a fire hose and landed on his left side with his arm "tucked" to his chest.  He reports pain with deep breathing, stretching and certain movements.  He has taken ibuprofen for pain without relief.  He also c/o abrasion to the left knee that also occurred during the fall, but he describes as "soreness" and states that he is able to stand and bend it without difficulty.  He denies abdominal pain, shortness of breath, numbness or pain to the left arm.     Past Medical History  Diagnosis Date  . Arthritis    Past Surgical History  Procedure Laterality Date  . Shoulder surgery    . Tonsillectomy      age 60  . Colonoscopy N/A 06/06/2012    [id number] diverticulosis. Colonic polyps, tubular adenoma, hyperplastic.Next TCS 05/2017.  Marland Kitchen Esophagogastroduodenoscopy N/A 06/16/2013    Dr.Rourk- normal esophagus. pt has mottling of the gastric mucosa w/antral erosions. no gastric ulcer or infiltrating process. pylorus patent and easily traversed examination bulb & second portion revealed 73mm areas of bulbar ulceration. there was surrounding erosions as well. bx= reactive gastropathy with slight chronic inflammation.   Family History  Problem Relation Age of Onset  . Breast cancer Mother   . Cancer - Other Father   . Lung cancer Sister   . Lung cancer Brother   . Cancer - Other Brother    History  Substance Use Topics  . Smoking status: Current Every Day Smoker -- 1.00 packs/day    Types: Cigarettes  . Smokeless tobacco: Not on file  . Alcohol Use: Yes   Comment: occasionally    Review of Systems  Constitutional: Negative for fever and chills.  Respiratory: Negative for cough, chest tightness and shortness of breath.   Cardiovascular: Positive for chest pain (left rib tenderness).  Gastrointestinal: Negative for nausea, vomiting, abdominal pain and abdominal distention.  Genitourinary: Negative for hematuria, flank pain and decreased urine volume.  Musculoskeletal: Positive for arthralgias (left knee soreness). Negative for neck pain and neck stiffness.  Neurological: Negative for dizziness, syncope, weakness and numbness.  All other systems reviewed and are negative.     Allergies  Review of patient's allergies indicates no known allergies.  Home Medications   Prior to Admission medications   Medication Sig Start Date End Date Taking? Authorizing Provider  acetaminophen (TYLENOL) 500 MG tablet Take 1,000 mg by mouth every 6 (six) hours as needed for pain.    Historical Provider, MD  Ascorbic Acid (VITAMIN C PO) Take 1,000 mg by mouth daily. Takes two tablets daily    Historical Provider, MD  aspirin EC 81 MG tablet Take 81 mg by mouth daily.    Historical Provider, MD  ibuprofen (ADVIL,MOTRIN) 200 MG tablet Take 400-600 mg by mouth every 6 (six) hours as needed. For pain    Historical Provider, MD  Multiple Vitamin (MULTIVITAMIN WITH MINERALS) TABS Take 1 tablet by mouth daily.    Historical Provider, MD  NON FORMULARY Vitamin D3  1000 mg   2 tablets daily  Historical Provider, MD  Nutritional Supplements (JOINT FORMULA PO) Take 2 tablets by mouth daily.    Historical Provider, MD  oxyCODONE-acetaminophen (PERCOCET/ROXICET) 5-325 MG per tablet Take 1 tablet by mouth every 4 (four) hours as needed. 02/17/14   Cledis Sohn L. Zachari Alberta, PA-C  pantoprazole (PROTONIX) 40 MG tablet Take 1 tablet (40 mg total) by mouth daily. 02/05/14   Ranae Pila, NP  vitamin B-12 (CYANOCOBALAMIN) 500 MCG tablet Take 500 mcg by mouth 2 (two) times daily.     Historical Provider, MD  vitamin E 1000 UNIT capsule Take 1,000 Units by mouth 2 (two) times daily.    Historical Provider, MD   BP 172/79 mmHg  Pulse 74  Resp 16  Ht 5\' 9"  (1.753 m)  Wt 202 lb 1.6 oz (91.672 kg)  BMI 29.83 kg/m2  SpO2 100% Physical Exam  Constitutional: He is oriented to person, place, and time. He appears well-developed and well-nourished. No distress.  HENT:  Head: Normocephalic and atraumatic.  Neck: Normal range of motion. Neck supple.  Cardiovascular: Normal rate, regular rhythm, normal heart sounds and intact distal pulses.   No murmur heard. Pulmonary/Chest: Effort normal and breath sounds normal. No respiratory distress. He exhibits tenderness.  Localized tenderness of the lateral left chest wall.  No splinting, crepitus or ecchymosis  Abdominal: Soft. He exhibits no distension. There is no tenderness. There is no rebound.  Musculoskeletal: Normal range of motion.  Pt has full ROM of the left knee.  Mild abrasion over the patella.  No bony deformity, no STS or effusion.  Lymphadenopathy:    He has no cervical adenopathy.  Neurological: He is alert and oriented to person, place, and time. He exhibits normal muscle tone. Coordination normal.  Skin: Skin is warm and dry.  Psychiatric: He has a normal mood and affect.  Nursing note and vitals reviewed.   ED Course  Procedures (including critical care time) Labs Review Labs Reviewed - No data to display  Imaging Review Dg Chest 2 View  02/17/2014   CLINICAL DATA:  Tripped over fire hose and fell is left-sided. Knee separation and left rib pain.  EXAM: CHEST  2 VIEW  COMPARISON:  None.  FINDINGS: The heart size and mediastinal contours are within normal limits. Both lungs are clear. The visualized skeletal structures are unremarkable.  IMPRESSION: No active cardiopulmonary disease.   Electronically Signed   By: Shon Hale M.D.   On: 02/17/2014 16:50     EKG Interpretation None      MDM   Final  diagnoses:  Chest wall contusion, left, initial encounter    Pt ambulates with a steady gait.  Mild tenderness to the left chest wall.  XR negative for fx.  Pt reassured.  Agrees to symptomatic treatment and close f/u with his PMD.  Vitals stable, appears stable for d/c and given strict return precautions      Richetta Cubillos L. Vanessa Lasara, PA-C 02/17/14 [place], DO 02/17/14 2042

## 2015-01-30 DIAGNOSIS — Z23 Encounter for immunization: Secondary | ICD-10-CM | POA: Diagnosis not present

## 2016-03-02 DIAGNOSIS — J329 Chronic sinusitis, unspecified: Secondary | ICD-10-CM | POA: Diagnosis not present

## 2016-03-02 DIAGNOSIS — F17290 Nicotine dependence, other tobacco product, uncomplicated: Secondary | ICD-10-CM | POA: Diagnosis not present

## 2016-03-02 DIAGNOSIS — Z6827 Body mass index (BMI) 27.0-27.9, adult: Secondary | ICD-10-CM | POA: Diagnosis not present

## 2017-04-18 ENCOUNTER — Encounter: Payer: Self-pay | Admitting: Internal Medicine

## 2017-11-05 DIAGNOSIS — Z23 Encounter for immunization: Secondary | ICD-10-CM | POA: Diagnosis not present

## 2017-12-05 DIAGNOSIS — Z1389 Encounter for screening for other disorder: Secondary | ICD-10-CM | POA: Diagnosis not present

## 2017-12-05 DIAGNOSIS — Z0001 Encounter for general adult medical examination with abnormal findings: Secondary | ICD-10-CM | POA: Diagnosis not present

## 2017-12-05 DIAGNOSIS — Z6823 Body mass index (BMI) 23.0-23.9, adult: Secondary | ICD-10-CM | POA: Diagnosis not present

## 2018-10-23 DIAGNOSIS — Z23 Encounter for immunization: Secondary | ICD-10-CM | POA: Diagnosis not present

## 2018-12-11 ENCOUNTER — Other Ambulatory Visit: Payer: Self-pay

## 2019-05-17 DIAGNOSIS — E7800 Pure hypercholesterolemia, unspecified: Secondary | ICD-10-CM

## 2019-05-17 HISTORY — DX: Pure hypercholesterolemia, unspecified: E78.00

## 2019-06-18 ENCOUNTER — Encounter (HOSPITAL_COMMUNITY): Payer: Self-pay

## 2019-06-18 ENCOUNTER — Emergency Department (HOSPITAL_COMMUNITY): Payer: Medicare HMO

## 2019-06-18 ENCOUNTER — Other Ambulatory Visit: Payer: Self-pay

## 2019-06-18 ENCOUNTER — Inpatient Hospital Stay (HOSPITAL_COMMUNITY)
Admission: EM | Admit: 2019-06-18 | Discharge: 2019-06-19 | DRG: 201 | Discharge status: Against Medical Advice | Payer: Medicare HMO | Attending: Internal Medicine | Admitting: Internal Medicine

## 2019-06-18 DIAGNOSIS — F0390 Unspecified dementia without behavioral disturbance: Secondary | ICD-10-CM | POA: Diagnosis present

## 2019-06-18 DIAGNOSIS — R739 Hyperglycemia, unspecified: Secondary | ICD-10-CM | POA: Diagnosis present

## 2019-06-18 DIAGNOSIS — Z801 Family history of malignant neoplasm of trachea, bronchus and lung: Secondary | ICD-10-CM | POA: Diagnosis not present

## 2019-06-18 DIAGNOSIS — Z803 Family history of malignant neoplasm of breast: Secondary | ICD-10-CM | POA: Diagnosis not present

## 2019-06-18 DIAGNOSIS — J939 Pneumothorax, unspecified: Secondary | ICD-10-CM | POA: Diagnosis present

## 2019-06-18 DIAGNOSIS — W1830XA Fall on same level, unspecified, initial encounter: Secondary | ICD-10-CM | POA: Diagnosis present

## 2019-06-18 DIAGNOSIS — S270XXA Traumatic pneumothorax, initial encounter: Secondary | ICD-10-CM | POA: Diagnosis present

## 2019-06-18 DIAGNOSIS — Z5329 Procedure and treatment not carried out because of patient's decision for other reasons: Secondary | ICD-10-CM | POA: Diagnosis present

## 2019-06-18 DIAGNOSIS — S270XXD Traumatic pneumothorax, subsequent encounter: Secondary | ICD-10-CM | POA: Diagnosis not present

## 2019-06-18 DIAGNOSIS — R0602 Shortness of breath: Secondary | ICD-10-CM

## 2019-06-18 DIAGNOSIS — J9383 Other pneumothorax: Secondary | ICD-10-CM

## 2019-06-18 DIAGNOSIS — T797XXA Traumatic subcutaneous emphysema, initial encounter: Secondary | ICD-10-CM

## 2019-06-18 DIAGNOSIS — F419 Anxiety disorder, unspecified: Secondary | ICD-10-CM | POA: Diagnosis present

## 2019-06-18 DIAGNOSIS — F17210 Nicotine dependence, cigarettes, uncomplicated: Secondary | ICD-10-CM | POA: Diagnosis present

## 2019-06-18 DIAGNOSIS — E785 Hyperlipidemia, unspecified: Secondary | ICD-10-CM | POA: Diagnosis present

## 2019-06-18 DIAGNOSIS — M1990 Unspecified osteoarthritis, unspecified site: Secondary | ICD-10-CM | POA: Diagnosis present

## 2019-06-18 DIAGNOSIS — Y92018 Other place in single-family (private) house as the place of occurrence of the external cause: Secondary | ICD-10-CM | POA: Diagnosis not present

## 2019-06-18 DIAGNOSIS — Z9689 Presence of other specified functional implants: Secondary | ICD-10-CM

## 2019-06-18 DIAGNOSIS — Z20822 Contact with and (suspected) exposure to covid-19: Secondary | ICD-10-CM | POA: Diagnosis present

## 2019-06-18 LAB — CBC WITH DIFFERENTIAL/PLATELET
Abs Immature Granulocytes: 0.04 10*3/uL (ref 0.00–0.07)
Basophils Absolute: 0.1 10*3/uL (ref 0.0–0.1)
Basophils Relative: 1 %
Eosinophils Absolute: 0.2 10*3/uL (ref 0.0–0.5)
Eosinophils Relative: 1 %
HCT: 42.7 % (ref 39.0–52.0)
Hemoglobin: 14 g/dL (ref 13.0–17.0)
Immature Granulocytes: 0 %
Lymphocytes Relative: 17 %
Lymphs Abs: 2 10*3/uL (ref 0.7–4.0)
MCH: 31.7 pg (ref 26.0–34.0)
MCHC: 32.8 g/dL (ref 30.0–36.0)
MCV: 96.6 fL (ref 80.0–100.0)
Monocytes Absolute: 0.6 10*3/uL (ref 0.1–1.0)
Monocytes Relative: 5 %
Neutro Abs: 9 10*3/uL — ABNORMAL HIGH (ref 1.7–7.7)
Neutrophils Relative %: 76 %
Platelets: 223 10*3/uL (ref 150–400)
RBC: 4.42 MIL/uL (ref 4.22–5.81)
RDW: 13.2 % (ref 11.5–15.5)
WBC: 11.9 10*3/uL — ABNORMAL HIGH (ref 4.0–10.5)
nRBC: 0 % (ref 0.0–0.2)

## 2019-06-18 LAB — HEMOGLOBIN A1C
Hgb A1c MFr Bld: 6.4 % — ABNORMAL HIGH (ref 4.8–5.6)
Mean Plasma Glucose: 136.98 mg/dL

## 2019-06-18 LAB — COMPREHENSIVE METABOLIC PANEL
ALT: 14 U/L (ref 0–44)
AST: 21 U/L (ref 15–41)
Albumin: 4.1 g/dL (ref 3.5–5.0)
Alkaline Phosphatase: 81 U/L (ref 38–126)
Anion gap: 12 (ref 5–15)
BUN: 11 mg/dL (ref 8–23)
CO2: 26 mmol/L (ref 22–32)
Calcium: 9 mg/dL (ref 8.9–10.3)
Chloride: 100 mmol/L (ref 98–111)
Creatinine, Ser: 0.82 mg/dL (ref 0.61–1.24)
GFR calc Af Amer: >60 mL/min (ref >60)
GFR calc non Af Amer: >60 mL/min (ref >60)
Glucose, Bld: 158 mg/dL — ABNORMAL HIGH (ref 70–99)
Potassium: 4 mmol/L (ref 3.5–5.1)
Sodium: 138 mmol/L (ref 135–145)
Total Bilirubin: 0.7 mg/dL (ref 0.3–1.2)
Total Protein: 7.7 g/dL (ref 6.5–8.1)

## 2019-06-18 LAB — CK: Total CK: 72 U/L (ref 49–397)

## 2019-06-18 LAB — SARS CORONAVIRUS 2 BY RT PCR (HOSPITAL ORDER, PERFORMED IN ~~LOC~~ HOSPITAL LAB): SARS Coronavirus 2: NEGATIVE

## 2019-06-18 MED ORDER — ONDANSETRON HCL 4 MG/2ML IJ SOLN: 4.0000 mg | Freq: Four times a day (QID) | INTRAMUSCULAR | Status: DC | PRN

## 2019-06-18 MED ORDER — TRAZODONE HCL 50 MG PO TABS
50.0000 mg | ORAL_TABLET | Freq: Every day | ORAL | Status: DC
  Administered 2019-06-18: 50 mg via ORAL
  Filled 2019-06-18: qty 1

## 2019-06-18 MED ORDER — ONDANSETRON HCL 4 MG PO TABS: 4.0000 mg | ORAL_TABLET | Freq: Four times a day (QID) | ORAL | Status: DC | PRN

## 2019-06-18 MED ORDER — LORAZEPAM 2 MG/ML IJ SOLN
0.5000 mg | Freq: Once | INTRAMUSCULAR | Status: AC
  Administered 2019-06-18: 0.5 mg via INTRAVENOUS
  Filled 2019-06-18: qty 1

## 2019-06-18 MED ORDER — SODIUM CHLORIDE 0.9 % IV SOLN: 250.0000 mL | INTRAVENOUS | Status: DC | PRN

## 2019-06-18 MED ORDER — NICOTINE 7 MG/24HR TD PT24
7.0000 mg | MEDICATED_PATCH | Freq: Every day | TRANSDERMAL | Status: DC
  Administered 2019-06-18 – 2019-06-19 (×2): 7 mg via TRANSDERMAL
  Filled 2019-06-18 (×5): qty 1

## 2019-06-18 MED ORDER — OXYCODONE HCL 5 MG PO TABS
5.0000 mg | ORAL_TABLET | ORAL | Status: DC | PRN
  Administered 2019-06-18 – 2019-06-19 (×3): 5 mg via ORAL
  Filled 2019-06-18 (×3): qty 1

## 2019-06-18 MED ORDER — ENOXAPARIN SODIUM 40 MG/0.4ML ~~LOC~~ SOLN
40.0000 mg | SUBCUTANEOUS | Status: DC
  Administered 2019-06-18 – 2019-06-19 (×2): 40 mg via SUBCUTANEOUS
  Filled 2019-06-18 (×2): qty 0.4

## 2019-06-18 MED ORDER — ALPRAZOLAM 0.25 MG PO TABS
0.2500 mg | ORAL_TABLET | Freq: Three times a day (TID) | ORAL | Status: DC | PRN
  Administered 2019-06-18: 0.25 mg via ORAL
  Filled 2019-06-18: qty 1

## 2019-06-18 MED ORDER — SODIUM CHLORIDE 0.9% FLUSH
3.0000 mL | Freq: Two times a day (BID) | INTRAVENOUS | Status: DC
  Administered 2019-06-18 – 2019-06-19 (×3): 3 mL via INTRAVENOUS

## 2019-06-18 MED ORDER — LIDOCAINE HCL (PF) 1 % IJ SOLN
INTRAMUSCULAR | Status: AC
  Filled 2019-06-18: qty 30

## 2019-06-18 MED ORDER — OMEGA-3-ACID ETHYL ESTERS 1 G PO CAPS
1000.0000 mg | ORAL_CAPSULE | Freq: Every day | ORAL | Status: DC
  Administered 2019-06-18 – 2019-06-19 (×2): 1000 mg via ORAL
  Filled 2019-06-18 (×5): qty 1

## 2019-06-18 MED ORDER — ACETAMINOPHEN 500 MG PO TABS: 1000.0000 mg | ORAL_TABLET | Freq: Four times a day (QID) | ORAL | Status: DC | PRN

## 2019-06-18 MED ORDER — SODIUM CHLORIDE 0.9% FLUSH: 3.0000 mL | INTRAVENOUS | Status: DC | PRN

## 2019-06-18 MED ORDER — HYDROMORPHONE HCL 1 MG/ML IJ SOLN
1.0000 mg | Freq: Once | INTRAMUSCULAR | Status: AC
  Administered 2019-06-18: 1 mg via INTRAVENOUS
  Filled 2019-06-18: qty 1

## 2019-06-18 NOTE — ED Provider Notes (Signed)
[facility] EMERGENCY DEPARTMENT Provider Note   CSN: [id number] Arrival date & time: 06/18/19  0732     History Chief Complaint  Patient presents with  . Fall    CHIRSTOPHER Carlson is a 74 y.o. male.  Patient states that last night he got up in the middle night fell his chest.  Now he has pain in his right chest  The history is provided by the patient. No language interpreter was used.  Fall This is a new problem. The current episode started 6 to 12 hours ago. The problem occurs constantly. The problem has not changed since onset.Associated symptoms include chest pain. Pertinent negatives include no abdominal pain and no headaches. Nothing aggravates the symptoms. Nothing relieves the symptoms. He has tried nothing for the symptoms. The treatment provided no relief.       Past Medical History:  Diagnosis Date  . Arthritis     Patient Active Problem List   Diagnosis Date Noted  . Pneumothorax 06/18/2019  . Pneumothorax on right   . Abnormal CT scan, stomach 06/03/2013  . Hx of adenomatous colonic polyps 06/03/2013    Past Surgical History:  Procedure Laterality Date  . COLONOSCOPY N/A 06/06/2012   [id number] diverticulosis. Colonic polyps, tubular adenoma, hyperplastic.Next TCS 05/2017.  Marland Kitchen ESOPHAGOGASTRODUODENOSCOPY N/A 06/16/2013   Dr.Rourk- normal esophagus. pt has mottling of the gastric mucosa w/antral erosions. no gastric ulcer or infiltrating process. pylorus patent and easily traversed examination bulb & second portion revealed 47mm areas of bulbar ulceration. there was surrounding erosions as well. bx= reactive gastropathy with slight chronic inflammation.  Marland Kitchen SHOULDER SURGERY    . TONSILLECTOMY     age 33       Family History  Problem Relation Age of Onset  . Breast cancer Mother   . Cancer - Other Father   . Lung cancer Sister   . Lung cancer Brother   . Cancer - Other Brother     Social History   Tobacco Use  . Smoking status: Current Every Day Smoker     Packs/day: 1.00    Types: Cigarettes  Substance Use Topics  . Alcohol use: Yes    Comment: occasionally  . Drug use: No    Home Medications Prior to Admission medications   Medication Sig Start Date End Date Taking? Authorizing Provider  acetaminophen (TYLENOL) 500 MG tablet Take 1,000 mg by mouth every 6 (six) hours as needed for pain.   Yes [provider]  Omega-3 Fatty Acids (FISH OIL) 500 MG CAPS Take 1 capsule by mouth daily.   Yes [provider]  rosuvastatin (CRESTOR) 10 MG tablet Take 10 mg by mouth daily. 06/16/19  Yes [provider]    Allergies    Patient has no known allergies.  Review of Systems   Review of Systems  Constitutional: Negative for appetite change and fatigue.  HENT: Negative for congestion, ear discharge and sinus pressure.   Eyes: Negative for discharge.  Respiratory: Negative for cough.   Cardiovascular: Positive for chest pain.  Gastrointestinal: Negative for abdominal pain and diarrhea.  Genitourinary: Negative for frequency and hematuria.  Musculoskeletal: Negative for back pain.  Skin: Negative for rash.  Neurological: Negative for seizures and headaches.  Psychiatric/Behavioral: Negative for hallucinations.    Physical Exam Updated Vital Signs BP (!) 156/91   Pulse 72   Temp 98 F (36.7 C) (Oral)   Resp 16   Ht 5\' 9"  (1.753 m)   Wt 77.1 kg  SpO2 96%   BMI 25.10 kg/m   Physical Exam Vitals and nursing note reviewed.  Constitutional:      Appearance: He is well-developed.  HENT:     Head: Normocephalic.     Nose: Nose normal.  Eyes:     General: No scleral icterus.    Conjunctiva/sclera: Conjunctivae normal.  Neck:     Thyroid: No thyromegaly.  Cardiovascular:     Rate and Rhythm: Normal rate and regular rhythm.     Heart sounds: No murmur. No friction rub. No gallop.   Pulmonary:     Breath sounds: No stridor. No wheezing or rales.     Comments: Tenderness to right chest Chest:     Chest  wall: No tenderness.  Abdominal:     General: There is no distension.     Tenderness: There is no abdominal tenderness. There is no rebound.  Musculoskeletal:        General: Normal range of motion.     Cervical back: Neck supple.  Lymphadenopathy:     Cervical: No cervical adenopathy.  Skin:    Findings: No erythema or rash.  Neurological:     Mental Status: He is alert and oriented to person, place, and time.     Motor: No abnormal muscle tone.     Coordination: Coordination normal.  Psychiatric:        Behavior: Behavior normal.     ED Results / Procedures / Treatments   Labs (all labs ordered are listed, but only abnormal results are displayed) Labs Reviewed  CBC WITH DIFFERENTIAL/PLATELET - Abnormal; Notable for the following components:      Result Value   WBC 11.9 (*)    Neutro Abs 9.0 (*)    All other components within normal limits  COMPREHENSIVE METABOLIC PANEL - Abnormal; Notable for the following components:   Glucose, Bld 158 (*)    All other components within normal limits  SARS CORONAVIRUS 2 BY RT PCR (HOSPITAL ORDER, [place] [facility])    EKG None  Radiology DG Chest Port 1 View  Result Date: 06/18/2019 CLINICAL DATA:  Right-sided chest tube placement EXAM: PORTABLE CHEST 1 VIEW COMPARISON:  06/18/2019, 8:09 a.m. FINDINGS: Interval placement of right chest pigtail chest tube, with substantial resolution of previously seen moderate right pneumothorax, persistent pneumothorax approximately 10%. The left lung is normally aerated. The heart and mediastinum are unremarkable. IMPRESSION: Interval placement of right chest pigtail chest tube, with substantial resolution of previously seen moderate right pneumothorax, persistent pneumothorax approximately 10%. Electronically Signed   By: Eddie Candle M.D.   On: 06/18/2019 10:57   DG Chest Port 1 View  Result Date: 06/18/2019 CLINICAL DATA:  Shortness of breath status post fall EXAM: PORTABLE CHEST  1 VIEW COMPARISON:  02/17/2014 FINDINGS: Large right pneumothorax measuring at least 50%. No focal consolidation and pleural effusion. No left pneumothorax. Stable cardiomediastinal silhouette. No aggressive osseous lesion. IMPRESSION: Large right pneumothorax measuring at least 50%. Critical Value/emergent results were called by telephone at the time of interpretation on 06/18/2019 at 8:42 am to provider [facility] Clyde Upshaw , who verbally acknowledged these results. Electronically Signed   By: Kathreen Devoid   On: 06/18/2019 08:43    Procedures Procedures (including critical care time)  Medications Ordered in ED Medications  LORazepam (ATIVAN) injection 0.5 mg (0.5 mg Intravenous Given 06/18/19 0917)  HYDROmorphone (DILAUDID) injection 1 mg (1 mg Intravenous Given 06/18/19 0948)  LORazepam (ATIVAN) injection 0.5 mg (0.5 mg Intravenous Given  06/18/19 [id number])    ED Course  I have reviewed the triage vital signs and the nursing notes.  Pertinent labs & imaging results that were available during my care of the patient were reviewed by me and considered in my medical decision making (see chart for details).    CRITICAL CARE Performed by: Milton Ferguson Total critical care time: 35 minutes Critical care time was exclusive of separately billable procedures and treating other patients. Critical care was necessary to treat or prevent imminent or life-threatening deterioration. Critical care was time spent personally by me on the following activities: development of treatment plan with patient and/or surrogate as well as nursing, discussions with consultants, evaluation of patient's response to treatment, examination of patient, obtaining history from patient or surrogate, ordering and performing treatments and interventions, ordering and review of laboratory studies, ordering and review of radiographic studies, pulse oximetry and re-evaluation of patient's condition.  MDM Rules/Calculators/A&P                       Patient with a pneumothorax on the right side that greater than 50%.  He does not have a tension pneumo.  I spoke with Dr. Leonides Schanz critical care and they will follow the patient in the hospital as long as the hospitalist admit the patient.  Dr. Constance Haw put the chest tube in without problems on the patient      This patient presents to the ED for concern of chest pain this involves an extensive number of treatment options, and is a complaint that carries with it a high risk of complications and morbidity.  The differential diagnosis includes rib fracture   Lab Tests:   I Ordered, reviewed, and interpreted labs, which included CBC chemistries which show leukocytosis and mild elevated glucose  Medicines ordered:   I ordered medication Dilaudid and Ativan for pain and anxiety  Imaging Studies ordered:   I ordered imaging studies which included chest x-ray and  I independently visualized and interpreted imaging which showed large right-sided pneumo  Additional history obtained:   Additional history obtained from daughter  Previous records obtained and reviewed   Consultations Obtained:   I consulted critical care, general surgery, hospitalist and discussed lab and imaging findings  Reevaluation:  After the interventions stated above, I reevaluated the patient and found improved  Critical Interventions:  .   Final Clinical Impression(s) / ED Diagnoses Final diagnoses:  Other pneumothorax    Rx / DC Orders ED Discharge Orders    None       Milton Ferguson, MD 06/18/19 1124

## 2019-06-18 NOTE — Consult Note (Addendum)
NAME:  Benjamin Carlson, MRN:  [id number], DOB:  1945/08/05, LOS: 0 ADMISSION DATE:  06/18/2019, CONSULTATION DATE:  06/18/19 REFERRING MD:  Shah/ [facility], CHIEF COMPLAINT:  R ptx   Brief History   66 yowm active smoker with minmal am cough/congestion but no h/o limiting doe fell and struck R ant chest on edge of table am 6/2 and came to [facility] with cp/sob and found to have R PTX so R CT by Dr Constance Haw and admitted by [facility] and PCCM service asked to consult at that point  History of present illness     74 y.o. male with medical history significant for ongoing tobacco abuse, mild osteoarthritis, and dyslipidemia who presented to the ED after he had a fall to his right chest while trying to pick up something from the couch earlier this morning.  He had severe right-sided chest pain and shortness of breath which prompted him to come to the emergency department.  He denies any lightheadedness, dizziness, or loss of consciousness.  He has had no prior symptoms of cough, fever, chills, or any other symptoms.   ED Course: Patient was noted to have a 50% pneumothorax on chest x-ray.  General surgery was consulted for placement of chest tube and this has resolved to approximately 10% on repeat chest x-ray.  Patient had no further shortness of breath   on room air.  Covid testing negative.   No  assoc excess/ purulent sputum or mucus plugs or hemoptysis or subjective wheeze or overt sinus or hb symptoms.   At baseline sleeps flat without nocturnal   exacerbation  of respiratory  c/o's or need for noct saba. Also denies any obvious fluctuation of symptoms with weather or environmental changes or other aggravating or alleviating factors except as outlined above   No unusual exposure hx or h/o childhood pna/ asthma or knowledge of premature birth.  Current Allergies, Complete Past Medical History, Past Surgical History, Family History, and Social History were reviewed in [employer] record.  ROS   The following are not active complaints unless bolded Hoarseness, sore throat, dysphagia, dental problems, itching, sneezing,  nasal congestion or discharge of excess mucus or purulent secretions, ear ache,   fever, chills, sweats, unintended wt loss or wt gain, classically   exertional cp,  orthopnea pnd or arm/hand swelling  or leg swelling, presyncope, palpitations, abdominal pain, anorexia, nausea, vomiting, diarrhea  or change in bowel habits or change in bladder habits, change in stools or change in urine, dysuria, hematuria,  rash, arthralgias, visual complaints, headache, numbness, weakness or ataxia or problems with walking or coordination,  change in mood or  memory.          Significant Hospital Events   Admit to floor pm 6/2   Consults:  General surgery/ [facility]  Procedures:  R chest tub 6/2   Significant Diagnostic Tests:     Micro Data:  Covid 19  PCR  6/2  Neg  Antimicrobials:    Scheduled Meds: . enoxaparin (LOVENOX) injection  40 mg Subcutaneous Q24H  . nicotine  7 mg Transdermal Daily  . omega-3 acid ethyl esters  1,000 mg Oral Daily  . sodium chloride flush  3 mL Intravenous Q12H   Continuous Infusions: . sodium chloride     PRN Meds:.sodium chloride, acetaminophen, ondansetron **OR** ondansetron (ZOFRAN) IV, sodium chloride flush    Interim history/subjective:  Feeling much better/  3/10 R ant cp at injury site   Objective  Blood pressure (!) 186/83, pulse 93, temperature 98.9 F (37.2 C), temperature source Oral, resp. rate 20, height 5\' 9"  (1.753 m), weight 77.1 kg, SpO2 100 %.       No intake or output data in the 24 hours ending 06/18/19 1440 Filed Weights   06/18/19 0746  Weight: 77.1 kg    Examination: General: robust pleast wm nad /Room air  Lungs: slt decreased bs bilaterally s crepitance over chest wasll  Cardiovascular: RRR no Haman's crunch Abdomen: soft/ bening  Extremities: warm s  CC or E  Neuro: intact   I personally  reviewed images and agree with radiology impression as follows:  pCXR:   6/2  Interval placement of right chest pigtail chest tube, with substantial resolution of previously seen moderate right pneumothorax, persistent pneumothorax approximately 10%.       Assessment & Plan:  1) traumatic R PTX in smoker with ? Underlying cb/copd  - improved p R Chest tube  - likely underlying rib fx that would be nice to know about in terms of anticipating lung healing/ pain resolution  >>> continue -20 cm suction >>> R rib detail 6/3   2) Active smoker  I took this opportunity with this patient to outline the consequences of continued cigarette use  in airway disorders based on all the data we have from the multiple national lung health studies (perfomed over decades at millions of dollars in cost)  indicating that smoking cessation, not choice of inhalers or physicians, is the most important aspect of his care going forward in terms of lung health.    >>> needs pft's in 3 months / will arrange from [place] office      Labs   CBC: Recent Labs  Lab 06/18/19 0900  WBC 11.9*  NEUTROABS 9.0*  HGB 14.0  HCT 42.7  MCV 96.6  PLT [id number]    Basic Metabolic Panel: Recent Labs  Lab 06/18/19 0900  NA 138  K 4.0  CL 100  CO2 26  GLUCOSE 158*  BUN 11  CREATININE 0.82  CALCIUM 9.0   GFR: Estimated Creatinine Clearance: 81.4 mL/min (by C-G formula based on SCr of 0.82 mg/dL). Recent Labs  Lab 06/18/19 0900  WBC 11.9*    Liver Function Tests: Recent Labs  Lab 06/18/19 0900  AST 21  ALT 14  ALKPHOS 81  BILITOT 0.7  PROT 7.7  ALBUMIN 4.1   No results for input(s): LIPASE, AMYLASE in the last 168 hours. No results for input(s): AMMONIA in the last 168 hours.  ABG No results found for: PHART, PCO2ART, PO2ART, HCO3, TCO2, ACIDBASEDEF, O2SAT   Coagulation Profile: No results for input(s): INR, PROTIME in the last 168 hours.  Cardiac Enzymes: Recent Labs  Lab 06/18/19 0900    CKTOTAL 72    HbA1C: No results found for: HGBA1C  CBG: No results for input(s): GLUCAP in the last 168 hours.     Past Medical History  He,  has a past medical history of Arthritis.   Surgical History    Past Surgical History:  Procedure Laterality Date  . COLONOSCOPY N/A 06/06/2012   [id number] diverticulosis. Colonic polyps, tubular adenoma, hyperplastic.Next TCS 05/2017.  Marland Kitchen ESOPHAGOGASTRODUODENOSCOPY N/A 06/16/2013   Dr.Rourk- normal esophagus. pt has mottling of the gastric mucosa w/antral erosions. no gastric ulcer or infiltrating process. pylorus patent and easily traversed examination bulb & second portion revealed 29mm areas of bulbar ulceration. there was surrounding erosions as well. bx= reactive gastropathy with slight chronic inflammation.  Marland Kitchen  SHOULDER SURGERY    . TONSILLECTOMY     age 74     Social History   reports that he has been smoking cigarettes. He has been smoking about 1.00 pack per day. He does not have any smokeless tobacco history on file. He reports current alcohol use. He reports that he does not use drugs.   Family History   His family history includes Breast cancer in his mother; Cancer - Other in his brother and father; Lung cancer in his brother and sister.   Allergies No Known Allergies   Home Medications  Prior to Admission medications   Medication Sig Start Date End Date Taking? Authorizing Provider  acetaminophen (TYLENOL) 500 MG tablet Take 1,000 mg by mouth every 6 (six) hours as needed for pain.   Yes [provider]  Omega-3 Fatty Acids (FISH OIL) 500 MG CAPS Take 1 capsule by mouth daily.   Yes [provider]  rosuvastatin (CRESTOR) 10 MG tablet Take 10 mg by mouth daily. 06/16/19  Yes [provider]        Christinia Gully, MD Pulmonary and [place] [phone] After 6:00 PM or weekends, use Beeper [phone]  After 7:00 pm call Elink  [phone]

## 2019-06-18 NOTE — Progress Notes (Addendum)
Pt arrived to room 307 via stretcher from ED. Pt ambulated with standby assist from stretcher to bed without difficulty. Pt oriented to room and safety precautions, states understanding. Pt standing and walking around room, advised pt to stay in bed or sitting on bed and to avoid a lot of walking in room to prevent accidental dislodgement of chest tube. Pt and daughter state understanding.  Vaseline guaze and large hemostat clamps present at head of bed.

## 2019-06-18 NOTE — Consult Note (Signed)
[facility] Consult  Reason for Consult: Right pneumothorax  Referring Physician:  Dr. Roderic Palau   Chief Complaint    Fall      HPI: Benjamin Carlson is a 74 y.o. male with a history of a fall early this morning as he was picking some up from his couch. He fell on the right side. He developed shortness of breath and soreness and came to the hospital.  He was evaluated and found to have a pneumthorax of 50%.  I was notified to place a chest tube.  He is otherwise healthy and takes only cholesterol medication he reports.   Past Medical History:  Diagnosis Date  . Arthritis     Past Surgical History:  Procedure Laterality Date  . COLONOSCOPY N/A 06/06/2012   [id number] diverticulosis. Colonic polyps, tubular adenoma, hyperplastic.Next TCS 05/2017.  Marland Kitchen ESOPHAGOGASTRODUODENOSCOPY N/A 06/16/2013   Dr.Rourk- normal esophagus. pt has mottling of the gastric mucosa w/antral erosions. no gastric ulcer or infiltrating process. pylorus patent and easily traversed examination bulb & second portion revealed 52mm areas of bulbar ulceration. there was surrounding erosions as well. bx= reactive gastropathy with slight chronic inflammation.  Marland Kitchen SHOULDER SURGERY    . TONSILLECTOMY     age 46    Family History  Problem Relation Age of Onset  . Breast cancer Mother   . Cancer - Other Father   . Lung cancer Sister   . Lung cancer Brother   . Cancer - Other Brother     Social History   Tobacco Use  . Smoking status: Current Every Day Smoker    Packs/day: 1.00    Types: Cigarettes  Substance Use Topics  . Alcohol use: Yes    Comment: occasionally  . Drug use: No    Medications: I have reviewed the patient's current medications. Current Facility-Administered Medications  Medication Dose Route Frequency Provider Last Rate Last Admin  . lidocaine (PF) (XYLOCAINE) 1 % injection            Current Outpatient Medications  Medication Sig Dispense Refill Last Dose  . acetaminophen  (TYLENOL) 500 MG tablet Take 1,000 mg by mouth every 6 (six) hours as needed for pain.     . Ascorbic Acid (VITAMIN C PO) Take 1,000 mg by mouth daily. Takes two tablets daily     . aspirin EC 81 MG tablet Take 81 mg by mouth daily.     Marland Kitchen ibuprofen (ADVIL,MOTRIN) 200 MG tablet Take 400-600 mg by mouth every 6 (six) hours as needed. For pain     . Multiple Vitamin (MULTIVITAMIN WITH MINERALS) TABS Take 1 tablet by mouth daily.     . NON FORMULARY Vitamin D3  1000 mg   2 tablets daily     . Nutritional Supplements (JOINT FORMULA PO) Take 2 tablets by mouth daily.     Marland Kitchen oxyCODONE-acetaminophen (PERCOCET/ROXICET) 5-325 MG per tablet Take 1 tablet by mouth every 4 (four) hours as needed. 20 tablet 0   . pantoprazole (PROTONIX) 40 MG tablet Take 1 tablet (40 mg total) by mouth daily. 30 tablet 5   . vitamin B-12 (CYANOCOBALAMIN) 500 MCG tablet Take 500 mcg by mouth 2 (two) times daily.     . vitamin E 1000 UNIT capsule Take 1,000 Units by mouth 2 (two) times daily.       No Known Allergies   ROS:  A comprehensive review of systems was negative except for: Respiratory: positive for shortness of breath Musculoskeletal: positive for right  sided chest pain  Blood pressure (!) 172/78, pulse 93, temperature 98 F (36.7 C), temperature source Oral, resp. rate 16, height 5\' 9"  (1.753 m), weight 77.1 kg, SpO2 92 %. Physical Exam Vitals reviewed.  Constitutional:      Appearance: He is normal weight.  HENT:     Head: Normocephalic and atraumatic.     Nose: Nose normal.     Mouth/Throat:     Mouth: Mucous membranes are moist.  Eyes:     Extraocular Movements: Extraocular movements intact.     Pupils: Pupils are equal, round, and reactive to light.  Cardiovascular:     Rate and Rhythm: Normal rate.  Pulmonary:     Effort: Pulmonary effort is normal.  Abdominal:     General: There is no distension.     Palpations: Abdomen is soft.     Tenderness: There is no abdominal tenderness.   Musculoskeletal:        General: No swelling. Normal range of motion.     Cervical back: Normal range of motion.  Skin:    General: Skin is warm and dry.  Neurological:     General: No focal deficit present.     Mental Status: He is alert and oriented to person, place, and time.  Psychiatric:        Mood and Affect: Mood normal.        Behavior: Behavior normal.        Thought Content: Thought content normal.        Judgment: Judgment normal.     Results: Results for orders placed or performed during the hospital encounter of 06/18/19 (from the past 48 hour(s))  SARS Coronavirus 2 by RT PCR (hospital order, performed in [facility] hospital lab) Nasopharyngeal Nasopharyngeal Swab     Status: None   Collection Time: 06/18/19  8:28 AM   Specimen: Nasopharyngeal Swab  Result Value Ref Range   SARS Coronavirus 2 NEGATIVE NEGATIVE    Comment: (NOTE) SARS-CoV-2 target nucleic acids are NOT DETECTED. The SARS-CoV-2 RNA is generally detectable in upper and lower respiratory specimens during the acute phase of infection. The lowest concentration of SARS-CoV-2 viral copies this assay can detect is 250 copies / mL. A negative result does not preclude SARS-CoV-2 infection and should not be used as the sole basis for treatment or other patient management decisions.  A negative result may occur with improper specimen collection / handling, submission of specimen other than nasopharyngeal swab, presence of viral mutation(s) within the areas targeted by this assay, and inadequate number of viral copies (<250 copies / mL). A negative result must be combined with clinical observations, patient history, and epidemiological information. Fact Sheet for Patients:   StrictlyIdeas.no Fact Sheet for Healthcare Providers: BankingDealers.co.za This test is not yet approved or cleared  by the [place] FDA and has been authorized for detection and/or  diagnosis of SARS-CoV-2 by FDA under an Emergency Use Authorization (EUA).  This EUA will remain in effect (meaning this test can be used) for the duration of the COVID-19 declaration under Section 564(b)(1) of the Act, 21 U.S.C. section 360bbb-3(b)(1), unless the authorization is terminated or revoked sooner. Performed at [facility], [address], [place], [place] [address]   CBC with Differential/Platelet     Status: Abnormal   Collection Time: 06/18/19  9:00 AM  Result Value Ref Range   WBC 11.9 (H) 4.0 - 10.5 K/uL   RBC 4.42 4.22 - 5.81 MIL/uL   Hemoglobin 14.0  13.0 - 17.0 g/dL   HCT 42.7 39.0 - 52.0 %   MCV 96.6 80.0 - 100.0 fL   MCH 31.7 26.0 - 34.0 pg   MCHC 32.8 30.0 - 36.0 g/dL   RDW 13.2 11.5 - 15.5 %   Platelets 223 150 - 400 K/uL   nRBC 0.0 0.0 - 0.2 %   Neutrophils Relative % 76 %   Neutro Abs 9.0 (H) 1.7 - 7.7 K/uL   Lymphocytes Relative 17 %   Lymphs Abs 2.0 0.7 - 4.0 K/uL   Monocytes Relative 5 %   Monocytes Absolute 0.6 0.1 - 1.0 K/uL   Eosinophils Relative 1 %   Eosinophils Absolute 0.2 0.0 - 0.5 K/uL   Basophils Relative 1 %   Basophils Absolute 0.1 0.0 - 0.1 K/uL   Immature Granulocytes 0 %   Abs Immature Granulocytes 0.04 0.00 - 0.07 K/uL    Comment: Performed at [facility], [address], [place], [place] [address]  Comprehensive metabolic panel     Status: Abnormal   Collection Time: 06/18/19  9:00 AM  Result Value Ref Range   Sodium 138 135 - 145 mmol/L   Potassium 4.0 3.5 - 5.1 mmol/L   Chloride 100 98 - 111 mmol/L   CO2 26 22 - 32 mmol/L   Glucose, Bld 158 (H) 70 - 99 mg/dL    Comment: Glucose reference range applies only to samples taken after fasting for at least 8 hours.   BUN 11 8 - 23 mg/dL   Creatinine, Ser 0.82 0.61 - 1.24 mg/dL   Calcium 9.0 8.9 - 10.3 mg/dL   Total Protein 7.7 6.5 - 8.1 g/dL   Albumin 4.1 3.5 - 5.0 g/dL   AST 21 15 - 41 U/L   ALT 14 0 - 44 U/L   Alkaline Phosphatase 81 38 - 126 U/L   Total Bilirubin 0.7 0.3 -  1.2 mg/dL   GFR calc non Af Amer >60 >60 mL/min   GFR calc Af Amer >60 >60 mL/min   Anion gap 12 5 - 15    Comment: Performed at [facility], [address] [place], [place] [address]   Personally reviewed- right pneumothorax  DG Chest Port 1 View  Result Date: 06/18/2019 CLINICAL DATA:  Shortness of breath status post fall EXAM: PORTABLE CHEST 1 VIEW COMPARISON:  02/17/2014 FINDINGS: Large right pneumothorax measuring at least 50%. No focal consolidation and pleural effusion. No left pneumothorax. Stable cardiomediastinal silhouette. No aggressive osseous lesion. IMPRESSION: Large right pneumothorax measuring at least 50%. Critical Value/emergent results were called by telephone at the time of interpretation on 06/18/2019 at 8:42 am to provider [facility] ZAMMIT , who verbally acknowledged these results. Electronically Signed   By: Kathreen Devoid   On: 06/18/2019 08:43     Assessment & Plan:  Benjamin Carlson is a 74 y.o. male with right pneumothorax that needs a chest tube. Discussed risk of bleeding, infection, injury to lung, need for tube in place for a few days.  -Patient and daughter agree.  -Patient can be admitted to hospitalist, pulmonary has said they will follow the chest tube for removal per Dr. Roderic Palau   Procedure:  Right 14 french pigtail chest tube placed  Pre procedure diagnosis: Right pneumothorax  Post procedure diagnosis: Same  Description: Consent was obtained and a timeout was performed. The CXR had been reviewed. Wearing full gloves and gown, the right chest was prepped and draped in the standard fashion with the patient semi  erect as he did not want to lay flat.  Between the mid to anterior axillary line at about the 6th rib, lidocaine was injected and aspirated air on injection over the rib.  The larger bore needle was then used to gain access into the pneumothorax with aspiration of air, and a guide wire was advanced easily.  A skin knick was made, and a dilator was passed over the  rib. The 14 french pig tail tube was the passed with control of the wire and the wire and introducer sheath were removed as the pigtail was pushed in.  Audible evacuation of air was noted. The tube was clamped, and the placed to suction and opened up. An airleak was noted but dissipated after a few coughs. A sterile occlusive dressing was placed. The patient tolerated the procedure well.  A CXR is ordered to confirm placement and pneumothorax change.   All questions were answered to the satisfaction of the patient and family.    Virl Cagey 06/18/2019, 10:21 AM

## 2019-06-18 NOTE — ED Notes (Signed)
Attempted report x 2 

## 2019-06-18 NOTE — ED Notes (Signed)
ED TO INPATIENT HANDOFF REPORT  ED Nurse Name and Phone #: [phone]  S Name/Age/Gender Raliegh Ip 74 y.o. male Room/Bed: APA09/APA09  Code Status   Code Status: Not on file  Home/SNF/Other Home Patient oriented to: self, place, time and situation Is this baseline? Yes   Triage Complete: Triage complete  Chief Complaint Pneumothorax [J93.9]  Triage Note Pt reports he got up off his couch yesterday and hand slipped and he fell across the arm of the couch.  Reports struck chest on the arm of the couch.  PT says since then has had soreness in chest and sob.      Allergies No Known Allergies  Level of Care/Admitting Diagnosis ED Disposition    ED Disposition Condition [facility] Area: [facility] [id number]  Level of Care: Med-Surg [16]  Covid Evaluation: Confirmed COVID Negative  Diagnosis: Pneumothorax [id number]  Admitting Physician: Rodena Goldmann [id number]  Attending Physician: Rodena Goldmann [id number]  Estimated length of stay: past midnight tomorrow  Certification:: I certify this patient will need inpatient services for at least 2 midnights       B Medical/Surgery History Past Medical History:  Diagnosis Date  . Arthritis    Past Surgical History:  Procedure Laterality Date  . COLONOSCOPY N/A 06/06/2012   [id number] diverticulosis. Colonic polyps, tubular adenoma, hyperplastic.Next TCS 05/2017.  Marland Kitchen ESOPHAGOGASTRODUODENOSCOPY N/A 06/16/2013   Dr.Rourk- normal esophagus. pt has mottling of the gastric mucosa w/antral erosions. no gastric ulcer or infiltrating process. pylorus patent and easily traversed examination bulb & second portion revealed 64mm areas of bulbar ulceration. there was surrounding erosions as well. bx= reactive gastropathy with slight chronic inflammation.  Marland Kitchen SHOULDER SURGERY    . TONSILLECTOMY     age 76     A IV Location/Drains/Wounds Patient Lines/Drains/Airways Status   Active Line/Drains/Airways    Name:    Placement date:   Placement time:   Site:   Days:   Peripheral IV 06/18/19 Right Antecubital   06/18/19    0909    Antecubital   less than 1   Chest Tube 1 Right;Lateral 14 Fr.   06/18/19    1018    --   less than 1          Intake/Output Last 24 hours No intake or output data in the 24 hours ending 06/18/19 1141  Labs/Imaging Results for orders placed or performed during the hospital encounter of 06/18/19 (from the past 48 hour(s))  SARS Coronavirus 2 by RT PCR (hospital order, performed in [facility] hospital lab) Nasopharyngeal Nasopharyngeal Swab     Status: None   Collection Time: 06/18/19  8:28 AM   Specimen: Nasopharyngeal Swab  Result Value Ref Range   SARS Coronavirus 2 NEGATIVE NEGATIVE    Comment: (NOTE) SARS-CoV-2 target nucleic acids are NOT DETECTED. The SARS-CoV-2 RNA is generally detectable in upper and lower respiratory specimens during the acute phase of infection. The lowest concentration of SARS-CoV-2 viral copies this assay can detect is 250 copies / mL. A negative result does not preclude SARS-CoV-2 infection and should not be used as the sole basis for treatment or other patient management decisions.  A negative result may occur with improper specimen collection / handling, submission of specimen other than nasopharyngeal swab, presence of viral mutation(s) within the areas targeted by this assay, and inadequate number of viral copies (<250 copies / mL). A negative result must be combined with clinical observations, patient history, and  epidemiological information. Fact Sheet for Patients:   StrictlyIdeas.no Fact Sheet for Healthcare Providers: BankingDealers.co.za This test is not yet approved or cleared  by the [place] FDA and has been authorized for detection and/or diagnosis of SARS-CoV-2 by FDA under an Emergency Use Authorization (EUA).  This EUA will remain in effect (meaning this test can be  used) for the duration of the COVID-19 declaration under Section 564(b)(1) of the Act, 21 U.S.C. section 360bbb-3(b)(1), unless the authorization is terminated or revoked sooner. Performed at [facility], [address], [place], [place] [address]   CBC with Differential/Platelet     Status: Abnormal   Collection Time: 06/18/19  9:00 AM  Result Value Ref Range   WBC 11.9 (H) 4.0 - 10.5 K/uL   RBC 4.42 4.22 - 5.81 MIL/uL   Hemoglobin 14.0 13.0 - 17.0 g/dL   HCT 42.7 39.0 - 52.0 %   MCV 96.6 80.0 - 100.0 fL   MCH 31.7 26.0 - 34.0 pg   MCHC 32.8 30.0 - 36.0 g/dL   RDW 13.2 11.5 - 15.5 %   Platelets 223 150 - 400 K/uL   nRBC 0.0 0.0 - 0.2 %   Neutrophils Relative % 76 %   Neutro Abs 9.0 (H) 1.7 - 7.7 K/uL   Lymphocytes Relative 17 %   Lymphs Abs 2.0 0.7 - 4.0 K/uL   Monocytes Relative 5 %   Monocytes Absolute 0.6 0.1 - 1.0 K/uL   Eosinophils Relative 1 %   Eosinophils Absolute 0.2 0.0 - 0.5 K/uL   Basophils Relative 1 %   Basophils Absolute 0.1 0.0 - 0.1 K/uL   Immature Granulocytes 0 %   Abs Immature Granulocytes 0.04 0.00 - 0.07 K/uL    Comment: Performed at [facility], [address], [place], [place] [address]  Comprehensive metabolic panel     Status: Abnormal   Collection Time: 06/18/19  9:00 AM  Result Value Ref Range   Sodium 138 135 - 145 mmol/L   Potassium 4.0 3.5 - 5.1 mmol/L   Chloride 100 98 - 111 mmol/L   CO2 26 22 - 32 mmol/L   Glucose, Bld 158 (H) 70 - 99 mg/dL    Comment: Glucose reference range applies only to samples taken after fasting for at least 8 hours.   BUN 11 8 - 23 mg/dL   Creatinine, Ser 0.82 0.61 - 1.24 mg/dL   Calcium 9.0 8.9 - 10.3 mg/dL   Total Protein 7.7 6.5 - 8.1 g/dL   Albumin 4.1 3.5 - 5.0 g/dL   AST 21 15 - 41 U/L   ALT 14 0 - 44 U/L   Alkaline Phosphatase 81 38 - 126 U/L   Total Bilirubin 0.7 0.3 - 1.2 mg/dL   GFR calc non Af Amer >60 >60 mL/min   GFR calc Af Amer >60 >60 mL/min   Anion gap 12 5 - 15    Comment: Performed at  [facility], [address] [place], [place] [address]   DG Chest Port 1 View  Result Date: 06/18/2019 CLINICAL DATA:  Right-sided chest tube placement EXAM: PORTABLE CHEST 1 VIEW COMPARISON:  06/18/2019, 8:09 a.m. FINDINGS: Interval placement of right chest pigtail chest tube, with substantial resolution of previously seen moderate right pneumothorax, persistent pneumothorax approximately 10%. The left lung is normally aerated. The heart and mediastinum are unremarkable. IMPRESSION: Interval placement of right chest pigtail chest tube, with substantial resolution of previously seen moderate right pneumothorax, persistent pneumothorax approximately 10%. Electronically Signed  By: Eddie Candle M.D.   On: 06/18/2019 10:57   DG Chest Port 1 View  Result Date: 06/18/2019 CLINICAL DATA:  Shortness of breath status post fall EXAM: PORTABLE CHEST 1 VIEW COMPARISON:  02/17/2014 FINDINGS: Large right pneumothorax measuring at least 50%. No focal consolidation and pleural effusion. No left pneumothorax. Stable cardiomediastinal silhouette. No aggressive osseous lesion. IMPRESSION: Large right pneumothorax measuring at least 50%. Critical Value/emergent results were called by telephone at the time of interpretation on 06/18/2019 at 8:42 am to provider [facility] ZAMMIT , who verbally acknowledged these results. Electronically Signed   By: Kathreen Devoid   On: 06/18/2019 08:43    Pending Labs Unresulted Labs (From admission, onward)   None      Vitals/Pain Today's Vitals   06/18/19 1000 06/18/19 1017 06/18/19 1030 06/18/19 1100  BP: 138/73  105/84 (!) 156/91  Pulse: 86 90 77 72  Resp: (!) 22 16 (!) 22 16  Temp:      TempSrc:      SpO2: (!) 89% 97% 94% 96%  Weight:      Height:      PainSc:        Isolation Precautions No active isolations  Medications Medications  LORazepam (ATIVAN) injection 0.5 mg (0.5 mg Intravenous Given 06/18/19 0917)  HYDROmorphone (DILAUDID) injection 1 mg (1 mg Intravenous  Given 06/18/19 0948)  LORazepam (ATIVAN) injection 0.5 mg (0.5 mg Intravenous Given 06/18/19 0948)    Mobility walks Low fall risk   Focused Assessments    R Recommendations: See Admitting Provider Note  Report given to:   Additional Notes:

## 2019-06-18 NOTE — ED Triage Notes (Signed)
Pt reports he got up off his couch yesterday and hand slipped and he fell across the arm of the couch.  Reports struck chest on the arm of the couch.  PT says since then has had soreness in chest and sob.

## 2019-06-18 NOTE — H&P (Addendum)
History and Physical    Benjamin Carlson [id number] DOB: [birth year] DOA: 06/18/2019  PCP: Patient, No Pcp Per   Patient coming from: Home  Chief Complaint: Right-sided chest pain and shortness of breath status post fall  HPI: Benjamin Carlson is a 74 y.o. male with medical history significant for ongoing tobacco abuse, mild osteoarthritis, and dyslipidemia who presented to the ED after he had a fall to his right chest while trying to pick up something from the couch earlier this morning.  He had severe right-sided chest pain and shortness of breath which prompted him to come to the emergency department.  He denies any lightheadedness, dizziness, or loss of consciousness.  He has had no prior symptoms of cough, fever, chills, or any other symptoms.   ED Course: Patient was noted to have a 50% pneumothorax on chest x-ray.  General surgery was consulted for placement of chest tube and this has resolved to approximately 10% on repeat chest x-ray.  Patient has no further shortness of breath and is currently on room air and appears comfortable.  Labs show mild leukocytosis of 11,900.  He is also noted to have some mild hyperglycemia with glucose 158.  Covid testing negative.  Review of Systems: All others reviewed and otherwise negative except as noted above.  Past Medical History:  Diagnosis Date  . Arthritis     Past Surgical History:  Procedure Laterality Date  . COLONOSCOPY N/A 06/06/2012   [id number] diverticulosis. Colonic polyps, tubular adenoma, hyperplastic.Next TCS 05/2017.  Marland Kitchen ESOPHAGOGASTRODUODENOSCOPY N/A 06/16/2013   Dr.Rourk- normal esophagus. pt has mottling of the gastric mucosa w/antral erosions. no gastric ulcer or infiltrating process. pylorus patent and easily traversed examination bulb & second portion revealed 85mm areas of bulbar ulceration. there was surrounding erosions as well. bx= reactive gastropathy with slight chronic inflammation.  Marland Kitchen SHOULDER SURGERY    . TONSILLECTOMY       age 89     reports that he has been smoking cigarettes. He has been smoking about 1.00 pack per day. He does not have any smokeless tobacco history on file. He reports current alcohol use. He reports that he does not use drugs.  No Known Allergies  Family History  Problem Relation Age of Onset  . Breast cancer Mother   . Cancer - Other Father   . Lung cancer Sister   . Lung cancer Brother   . Cancer - Other Brother     Prior to Admission medications   Medication Sig Start Date End Date Taking? Authorizing Provider  acetaminophen (TYLENOL) 500 MG tablet Take 1,000 mg by mouth every 6 (six) hours as needed for pain.   Yes [provider]  Omega-3 Fatty Acids (FISH OIL) 500 MG CAPS Take 1 capsule by mouth daily.   Yes [provider]  rosuvastatin (CRESTOR) 10 MG tablet Take 10 mg by mouth daily. 06/16/19  Yes [provider]    Physical Exam: Vitals:   06/18/19 1017 06/18/19 1030 06/18/19 1100 06/18/19 1130  BP:  105/84 (!) 156/91 (!) 141/108  Pulse: 90 77 72 74  Resp: 16 (!) 22 16 18   Temp:      TempSrc:      SpO2: 97% 94% 96% 97%  Weight:      Height:        Constitutional: NAD, calm, comfortable Vitals:   06/18/19 1017 06/18/19 1030 06/18/19 1100 06/18/19 1130  BP:  105/84 (!) 156/91 (!) 141/108  Pulse: 90 77 72 74  Resp: 16 (!) 22 16 18   Temp:      TempSrc:      SpO2: 97% 94% 96% 97%  Weight:      Height:       Eyes: lids and conjunctivae normal ENMT: Mucous membranes are moist.  Neck: normal, supple Respiratory: clear to auscultation bilaterally. Normal respiratory effort. No accessory muscle use.  Chest tube noted on right side. Cardiovascular: Regular rate and rhythm, no murmurs. No extremity edema. Abdomen: no tenderness, no distention. Bowel sounds positive.  Musculoskeletal:  No joint deformity upper and lower extremities.   Skin: no rashes, lesions, ulcers.  Psychiatric: Normal judgment and insight. Alert and oriented x 3.  Normal mood.   Labs on Admission: I have personally reviewed following labs and imaging studies  CBC: Recent Labs  Lab 06/18/19 0900  WBC 11.9*  NEUTROABS 9.0*  HGB 14.0  HCT 42.7  MCV 96.6  PLT [id number]   Basic Metabolic Panel: Recent Labs  Lab 06/18/19 0900  NA 138  K 4.0  CL 100  CO2 26  GLUCOSE 158*  BUN 11  CREATININE 0.82  CALCIUM 9.0   GFR: Estimated Creatinine Clearance: 81.4 mL/min (by C-G formula based on SCr of 0.82 mg/dL). Liver Function Tests: Recent Labs  Lab 06/18/19 0900  AST 21  ALT 14  ALKPHOS 81  BILITOT 0.7  PROT 7.7  ALBUMIN 4.1   No results for input(s): LIPASE, AMYLASE in the last 168 hours. No results for input(s): AMMONIA in the last 168 hours. Coagulation Profile: No results for input(s): INR, PROTIME in the last 168 hours. Cardiac Enzymes: No results for input(s): CKTOTAL, CKMB, CKMBINDEX, TROPONINI in the last 168 hours. BNP (last 3 results) No results for input(s): PROBNP in the last 8760 hours. HbA1C: No results for input(s): HGBA1C in the last 72 hours. CBG: No results for input(s): GLUCAP in the last 168 hours. Lipid Profile: No results for input(s): CHOL, HDL, LDLCALC, TRIG, CHOLHDL, LDLDIRECT in the last 72 hours. Thyroid Function Tests: No results for input(s): TSH, T4TOTAL, FREET4, T3FREE, THYROIDAB in the last 72 hours. Anemia Panel: No results for input(s): VITAMINB12, FOLATE, FERRITIN, TIBC, IRON, RETICCTPCT in the last 72 hours. Urine analysis:    Component Value Date/Time   COLORURINE YELLOW 01/09/2011 1106   APPEARANCEUR CLEAR 01/09/2011 1106   LABSPEC >1.030 (H) 01/09/2011 1106   PHURINE 6.0 01/09/2011 1106   GLUCOSEU NEGATIVE 01/09/2011 1106   HGBUR NEGATIVE 01/09/2011 1106   BILIRUBINUR MODERATE (A) 01/09/2011 1106   KETONESUR TRACE (A) 01/09/2011 1106   PROTEINUR 30 (A) 01/09/2011 1106   UROBILINOGEN 1.0 01/09/2011 1106   NITRITE NEGATIVE 01/09/2011 1106   LEUKOCYTESUR NEGATIVE 01/09/2011 1106     Radiological Exams on Admission: DG Chest Port 1 View  Result Date: 06/18/2019 CLINICAL DATA:  Right-sided chest tube placement EXAM: PORTABLE CHEST 1 VIEW COMPARISON:  06/18/2019, 8:09 a.m. FINDINGS: Interval placement of right chest pigtail chest tube, with substantial resolution of previously seen moderate right pneumothorax, persistent pneumothorax approximately 10%. The left lung is normally aerated. The heart and mediastinum are unremarkable. IMPRESSION: Interval placement of right chest pigtail chest tube, with substantial resolution of previously seen moderate right pneumothorax, persistent pneumothorax approximately 10%. Electronically Signed   By: Eddie Candle M.D.   On: 06/18/2019 10:57   DG Chest Port 1 View  Result Date: 06/18/2019 CLINICAL DATA:  Shortness of breath status post fall EXAM: PORTABLE CHEST 1 VIEW COMPARISON:  02/17/2014 FINDINGS: Large right pneumothorax measuring at least  50%. No focal consolidation and pleural effusion. No left pneumothorax. Stable cardiomediastinal silhouette. No aggressive osseous lesion. IMPRESSION: Large right pneumothorax measuring at least 50%. Critical Value/emergent results were called by telephone at the time of interpretation on 06/18/2019 at 8:42 am to provider [facility] ZAMMIT , who verbally acknowledged these results. Electronically Signed   By: Kathreen Devoid   On: 06/18/2019 08:43    EKG: Independently reviewed.  Sinus rhythm 85 bpm.  Assessment/Plan Active Problems:   Pneumothorax    Traumatic right-sided pneumothorax status post chest tube placement -Appreciate ongoing pulmonology involvement for management of chest tube -Monitor carefully with repeat CXR in am  Dyslipidemia -Plan to continue statin and fish oil once CK levels have been noted  Mild hyperglycemia -Plan to check hemoglobin A1c  Mild osteoarthritis -Tylenol as needed  Ongoing tobacco abuse -Counseled on cessation -Offered nicotine patch   DVT prophylaxis:  Lovenox Code Status: Full Family Communication: Daughter at bedside Disposition Plan:Admit for management of pneumothorax Consults called:General Surgery for chest tube placement; Pulmonology Dr. Melvyn Novas for CT management by EDP Admission status: Inpatient, MedSurg Status is: Inpatient  Remains inpatient appropriate because:Unsafe d/c plan and Inpatient level of care appropriate due to severity of illness   Dispo: The patient is from: Home              Anticipated d/c is to: Home              Anticipated d/c date is: 2 days              Patient currently is not medically stable to d/c.  Patient currently has a chest tube that needs management per pulmonology.  Tynlee Bayle D Manuella Ghazi DO [facility]  If 7PM-7AM, please contact night-coverage [email or web address]  06/18/2019, 12:10 PM

## 2019-06-18 NOTE — Progress Notes (Signed)
Pt continues to stand up and walk around in room. Bed alarm sounding and found pt at doorway of room with pleurovac laying in floor and tubing pulled tight. Pt seems unable to understand that he must not be getting OOB and pulling on chest tube. Pt states, "I just gotta get out of here. I need to go outside, I need to smoke." Reminded pt that he has a nicotine patch on and that the hospital has a smoke free/tobacco free policy. Pt has been calling daughter repeatedly on phone. Daughter presents with food from outside for her father. When I discussed her father's continued restlessness and difficulty understanding need to stay in room, daughter states that pt has dementia and it is getting worse. She states that pt does not have an "official diagnosis" because he refuses to see a doctor, but his forgetfulness, anxiety, restlessness and wandering has gotten increasingly worse over the past 3 years. She states that pt's mother had same diagnosis prior to her death. She states that someone usually stays with patient and she is concerned that he will be more disoriented tonight. She states that she cannot stay with him, as she must keep her grandchildren tonight. However, she would like for the MD to call her tomorrow morning for update.  Talked with charge nurse regarding moving pt closer to nursing station, but no empty rooms available at present. If pt's disorientation and wandering/restlessness worsens, will discuss possible AVS for pt.

## 2019-06-18 NOTE — ED Notes (Signed)
Attempted report x1. 

## 2019-06-19 ENCOUNTER — Inpatient Hospital Stay (HOSPITAL_COMMUNITY): Payer: Medicare HMO

## 2019-06-19 DIAGNOSIS — J939 Pneumothorax, unspecified: Secondary | ICD-10-CM

## 2019-06-19 DIAGNOSIS — T797XXA Traumatic subcutaneous emphysema, initial encounter: Secondary | ICD-10-CM

## 2019-06-19 DIAGNOSIS — S270XXD Traumatic pneumothorax, subsequent encounter: Secondary | ICD-10-CM

## 2019-06-19 LAB — CBC
HCT: 42.3 % (ref 39.0–52.0)
Hemoglobin: 14 g/dL (ref 13.0–17.0)
MCH: 31.6 pg (ref 26.0–34.0)
MCHC: 33.1 g/dL (ref 30.0–36.0)
MCV: 95.5 fL (ref 80.0–100.0)
Platelets: 196 10*3/uL (ref 150–400)
RBC: 4.43 MIL/uL (ref 4.22–5.81)
RDW: 13.2 % (ref 11.5–15.5)
WBC: 10.4 10*3/uL (ref 4.0–10.5)
nRBC: 0 % (ref 0.0–0.2)

## 2019-06-19 LAB — BASIC METABOLIC PANEL
Anion gap: 9 (ref 5–15)
BUN: 12 mg/dL (ref 8–23)
CO2: 27 mmol/L (ref 22–32)
Calcium: 8.8 mg/dL — ABNORMAL LOW (ref 8.9–10.3)
Chloride: 100 mmol/L (ref 98–111)
Creatinine, Ser: 0.75 mg/dL (ref 0.61–1.24)
GFR calc Af Amer: >60 mL/min (ref >60)
GFR calc non Af Amer: >60 mL/min (ref >60)
Glucose, Bld: 164 mg/dL — ABNORMAL HIGH (ref 70–99)
Potassium: 3.5 mmol/L (ref 3.5–5.1)
Sodium: 136 mmol/L (ref 135–145)

## 2019-06-19 LAB — MAGNESIUM: Magnesium: 1.8 mg/dL (ref 1.7–2.4)

## 2019-06-19 MED ORDER — LORAZEPAM 2 MG/ML IJ SOLN: 1.0000 mg | Freq: Once | INTRAMUSCULAR | Status: DC

## 2019-06-19 MED ORDER — ROSUVASTATIN CALCIUM 10 MG PO TABS
10.0000 mg | ORAL_TABLET | Freq: Every day | ORAL | Status: DC
  Administered 2019-06-19: 10 mg via ORAL
  Filled 2019-06-19: qty 1

## 2019-06-19 MED ORDER — CHLORHEXIDINE GLUCONATE CLOTH 2 % EX PADS
6.0000 | MEDICATED_PAD | Freq: Every day | CUTANEOUS | Status: DC
  Administered 2019-06-19: 6 via TOPICAL

## 2019-06-19 MED ORDER — HALOPERIDOL LACTATE 5 MG/ML IJ SOLN
5.0000 mg | Freq: Once | INTRAMUSCULAR | Status: AC
  Administered 2019-06-19: 5 mg via INTRAVENOUS
  Filled 2019-06-19: qty 1

## 2019-06-19 MED ORDER — HALOPERIDOL LACTATE 5 MG/ML IJ SOLN
2.0000 mg | Freq: Four times a day (QID) | INTRAMUSCULAR | Status: DC | PRN
  Administered 2019-06-19: 2 mg via INTRAVENOUS
  Filled 2019-06-19: qty 1

## 2019-06-19 NOTE — Progress Notes (Signed)
[facility]  Reviewed CT with Dr. Thornton Papas. Chest tube in good position and air tracking from intercostal most likely. Is not going out to the skin so another stitch is not going to help. Upsizing tube will likely cause more leak and risk of infection. Would keep to suction and monitor for now. Left side looks more like pneumomediastinum air tracking to left and not true pneumothorax. No rib fracture noted.  Updated Dr. Manuella Ghazi and Dr. Melvyn Novas.  Talked to ICU RN.   Curlene Labrum, MD [facility] [address] [place], [place] [address] [phone] (office)

## 2019-06-19 NOTE — Progress Notes (Signed)
[facility]  Called about chest tube being dislodged by patient. CXR with small apical pneumothorax still and continued subcutaneous emphysema that is extensive.  Given the subcutaneous emphysema there is still some degree of airleak and has the potential to re-acccumlate.    Patient is oriented to self, date of birth, date, location, situation, and fact that he could die if this re-accumulates and could cause cardiopulmonary collapse.  He wants to leave and his girlfriend has tried to get him to stay multiple times. Dr. Manuella Ghazi and myself have talked to the patient extensively.   BP 139/61   Pulse (!) 114   Temp 98.9 F (37.2 C) (Oral)   Resp 19   Ht 5\' 9"  (1.753 m)   Wt 79.4 kg   SpO2 96%   BMI 25.85 kg/m   Recommend replacement chest tube. Patient wanting to leave AMA and oriented to make this decision.   Recommended family to stay up with him tonight, return to ED tomorrow for repeat CXR tomorrow AM, monitor for SOB and signs of worsening subcutaneous emphysema and girlfriend has felt what this feels like. Recommended that if he gets worse to call EMS and get them to needle decompress him and come to the hospital.  I wrote down for her right pneumothorax- EMS to needle decompress so she had this information.  Family upset. Patient adamant.  Curlene Labrum, MD [facility] [address] [place], [place] [address] [phone] (office)

## 2019-06-19 NOTE — Progress Notes (Signed)
[facility]  CXR reviewed. Tube may be slightly dislodged. The drainage holes on the end of the tube and I do not see obvious evidence that one is in the subcutaneous tissue. He had some subcutaneous gas yesterday after insertion.  Lung is otherwise up and no pneumothorax noted.   Patient reported to be stable. Will hold until radiology is able to read.   Supplies for replacement at bedside.    Do not want to replace unnecessarily and cause potential of lung injury etc.   Curlene Labrum, MD [facility] [address] [place], [place] [address] [phone] (office)

## 2019-06-19 NOTE — Progress Notes (Signed)
Pt is very restless this shift. Tech stated that patient is in the bed on all fours. Pt states he can't get comfortable. Daughter at bedside and requesting for something to help pt relax. MD made aware.

## 2019-06-19 NOTE — Progress Notes (Signed)
After a multidisciplinary approach between the attending RN and multiple MD's trying to convince and educate the patient to stay and explaining the risks and benefits of if he doesn't stay, the patient insisted on leaving AMA. He is alert and oriented X3, so he is able to make decisions on his own. AMA paper signed and placed in chart. Pt walked out by girlfriend.

## 2019-06-19 NOTE — Progress Notes (Signed)
PROGRESS NOTE    Benjamin Carlson  [id number] DOB: Jun 02, 1945 DOA: 06/18/2019 PCP: Patient, No Pcp Per   Brief Narrative:  Per HPI: Benjamin Carlson is a 74 y.o. male with medical history significant for ongoing tobacco abuse, mild osteoarthritis, and dyslipidemia who presented to the ED after he had a fall to his right chest while trying to pick up something from the couch earlier this morning.  He had severe right-sided chest pain and shortness of breath which prompted him to come to the emergency department.  He denies any lightheadedness, dizziness, or loss of consciousness.  He has had no prior symptoms of cough, fever, chills, or any other symptoms.  -He was noted to have a traumatic right-sided pneumothorax and is status post chest tube placement.  He is noted to have subcutaneous emphysema this morning and repeat imaging demonstrates likely some air tracking from his intercostals for which further manipulation of the chest tube is unlikely to help at this time.  He will require close monitoring in the ICU given his propensity for confusion and anxiety in the setting of dementia.  Assessment & Plan:   Active Problems:   Pneumothorax   Subcutaneous emphysema (HCC)   Traumatic right-sided pneumothorax status post chest tube placement -Appreciate ongoing general surgery and pulmonology recommendations -Repeat chest x-rays as well as CT chest performed on 6/3 with chest tube that appears to be in good position and air tracking from intercostals as well as some left-sided pneumomediastinum.  No rib fractures noted. -Plan to monitor now in ICU given patient's propensity for confusion and anxiety with possible history of dementia  Dyslipidemia -Plan to resume statin as CK levels are within normal limits  Mild hyperglycemia -Hemoglobin A1c 6.4% and will need careful follow-up in the outpatient setting  Mild osteoarthritis -Tylenol as needed  Ongoing tobacco abuse -Counseled on  cessation -Offered nicotine patch   DVT prophylaxis: Lovenox Code Status: Full Family Communication: Discussed with daughter 6/3 Disposition Plan: Transfer to ICU for closer monitoring while on chest tube.  Further recommendations per general surgery and pulmonology. Status is: Inpatient  Remains inpatient appropriate because:Unsafe d/c plan and Inpatient level of care appropriate due to severity of illness   Dispo: The patient is from: Home              Anticipated d/c is to: Home              Anticipated d/c date is: 2 days              Patient currently is not medically stable to d/c.  He continues to require management of his chest tube with subcutaneous emphysema noted.  Consultants:   General surgery  Pulmonology  Procedures:   Chest tube placement 06/18/2019  Antimicrobials:  Anti-infectives (From admission, onward)   None       Subjective: Patient seen and evaluated today with no new acute complaints or concerns.  He has been noted to have some anxiety and mild periods of agitation overnight but is easily redirected.  There was some concern that he may have mechanically disrupted his chest tube as well.  Objective: Vitals:   06/19/19 0508 06/19/19 0718 06/19/19 0900 06/19/19 1330  BP: 122/63 125/61 (!) 110/55 137/76  Pulse: 99 (!) 113 79 92  Resp: 20 20 18 18   Temp: 98.7 F (37.1 C) 98.2 F (36.8 C) 98.9 F (37.2 C)   TempSrc: Oral Oral Oral   SpO2: 92% 97% 98% 99%  Weight:      Height:        Intake/Output Summary (Last 24 hours) at 06/19/2019 1346 Last data filed at 06/19/2019 1205 Gross per 24 hour  Intake 488 ml  Output 1490 ml  Net -1002 ml   Filed Weights   06/18/19 0746 06/18/19 1430  Weight: 77.1 kg 79.4 kg    Examination:  General exam: Appears calm and comfortable  Respiratory system: Clear to auscultation. Respiratory effort normal.  Chest tube to suction noted on right side. Cardiovascular system: S1 & S2 heard, RRR. No JVD, murmurs,  rubs, gallops or clicks. No pedal edema. Gastrointestinal system: Abdomen is nondistended, soft and nontender. No organomegaly or masses felt. Normal bowel sounds heard. Central nervous system: Alert and oriented. No focal neurological deficits. Extremities: Symmetric 5 x 5 power. Skin: No rashes, lesions or ulcers Psychiatry: Judgement and insight appear normal. Mood & affect appropriate.     Data Reviewed: I have personally reviewed following labs and imaging studies  CBC: Recent Labs  Lab 06/18/19 0900 06/19/19 0636  WBC 11.9* 10.4  NEUTROABS 9.0*  --   HGB 14.0 14.0  HCT 42.7 42.3  MCV 96.6 95.5  PLT 223 [id number]   Basic Metabolic Panel: Recent Labs  Lab 06/18/19 0900 06/19/19 0636  NA 138 136  K 4.0 3.5  CL 100 100  CO2 26 27  GLUCOSE 158* 164*  BUN 11 12  CREATININE 0.82 0.75  CALCIUM 9.0 8.8*  MG  --  1.8   GFR: Estimated Creatinine Clearance: 83.5 mL/min (by C-G formula based on SCr of 0.75 mg/dL). Liver Function Tests: Recent Labs  Lab 06/18/19 0900  AST 21  ALT 14  ALKPHOS 81  BILITOT 0.7  PROT 7.7  ALBUMIN 4.1   No results for input(s): LIPASE, AMYLASE in the last 168 hours. No results for input(s): AMMONIA in the last 168 hours. Coagulation Profile: No results for input(s): INR, PROTIME in the last 168 hours. Cardiac Enzymes: Recent Labs  Lab 06/18/19 0900  CKTOTAL 72   BNP (last 3 results) No results for input(s): PROBNP in the last 8760 hours. HbA1C: Recent Labs    06/18/19 0900  HGBA1C 6.4*   CBG: No results for input(s): GLUCAP in the last 168 hours. Lipid Profile: No results for input(s): CHOL, HDL, LDLCALC, TRIG, CHOLHDL, LDLDIRECT in the last 72 hours. Thyroid Function Tests: No results for input(s): TSH, T4TOTAL, FREET4, T3FREE, THYROIDAB in the last 72 hours. Anemia Panel: No results for input(s): VITAMINB12, FOLATE, FERRITIN, TIBC, IRON, RETICCTPCT in the last 72 hours. Sepsis Labs: No results for input(s): PROCALCITON,  LATICACIDVEN in the last 168 hours.  Recent Results (from the past 240 hour(s))  SARS Coronavirus 2 by RT PCR (hospital order, performed in [facility] hospital lab) Nasopharyngeal Nasopharyngeal Swab     Status: None   Collection Time: 06/18/19  8:28 AM   Specimen: Nasopharyngeal Swab  Result Value Ref Range Status   SARS Coronavirus 2 NEGATIVE NEGATIVE Final    Comment: (NOTE) SARS-CoV-2 target nucleic acids are NOT DETECTED. The SARS-CoV-2 RNA is generally detectable in upper and lower respiratory specimens during the acute phase of infection. The lowest concentration of SARS-CoV-2 viral copies this assay can detect is 250 copies / mL. A negative result does not preclude SARS-CoV-2 infection and should not be used as the sole basis for treatment or other patient management decisions.  A negative result may occur with improper specimen collection / handling, submission of specimen other than  nasopharyngeal swab, presence of viral mutation(s) within the areas targeted by this assay, and inadequate number of viral copies (<250 copies / mL). A negative result must be combined with clinical observations, patient history, and epidemiological information. Fact Sheet for Patients:   StrictlyIdeas.no Fact Sheet for Healthcare Providers: BankingDealers.co.za This test is not yet approved or cleared  by the [place] FDA and has been authorized for detection and/or diagnosis of SARS-CoV-2 by FDA under an Emergency Use Authorization (EUA).  This EUA will remain in effect (meaning this test can be used) for the duration of the COVID-19 declaration under Section 564(b)(1) of the Act, 21 U.S.C. section 360bbb-3(b)(1), unless the authorization is terminated or revoked sooner. Performed at [facility], [address], [place], [place] [address]          Radiology Studies: DG Chest 2 View  Result Date: 06/19/2019 CLINICAL DATA:   Pneumothorax follow-up EXAM: CHEST - 2 VIEW COMPARISON:  Yesterday FINDINGS: Right-sided chest tube in similar position. New pneumomediastinum and increased right pneumothorax, now seen at the base. Extensive chest wall emphysema. A left apical lucency may be extrapleural gas given constellation of findings, or trace pneumothorax. Normal heart size. IMPRESSION: 1. Increased right pneumothorax, now also seen at the base. 2. New pneumomediastinum and extensive chest wall emphysema. 3. Lucency at the left apex could be extrapleural or pneumothorax, consider short follow-up. Electronically Signed   By: Monte Fantasia M.D.   On: 06/19/2019 10:29   DG Ribs Unilateral Right  Result Date: 06/19/2019 CLINICAL DATA:  Chest tube EXAM: RIGHT RIBS - 2 VIEW COMPARISON:  Chest x-ray from the same time in yesterday FINDINGS: Known right pneumothorax and pneumomediastinum. A right chest tube is in place. No visible rib fracture or erosion, but limited by the degree of soft tissue emphysema. IMPRESSION: Negative right rib series.  Referenced contemporaneous chest x-ray. Electronically Signed   By: Monte Fantasia M.D.   On: 06/19/2019 10:36   CT CHEST WO CONTRAST  Result Date: 06/19/2019 CLINICAL DATA:  Pneumothorax, chest tube placement, subsequent chest wall emphysema and pneumomediastinum EXAM: CT CHEST WITHOUT CONTRAST TECHNIQUE: Multidetector CT imaging of the chest was performed following the standard protocol without IV contrast. Sagittal and coronal MPR images reconstructed from axial data set. 06/19/2019 COMPARISON:  Chest radiograph FINDINGS: Cardiovascular: Atherosclerotic calcifications aorta and coronary arteries. Aorta normal caliber. Heart unremarkable. Mediastinum/Nodes: Extensive mediastinal pneumatosis. Extensive chest wall pneumatosis RIGHT greater than LEFT. Extension of chest wall and mediastinal pneumatosis into the inferior cervical regions bilaterally. Base of cervical region otherwise normal  appearance. Esophagus unremarkable. Lungs/Pleura: Pigtail RIGHT thoracostomy tube at the anterior mid chest. Persistent small RIGHT pneumothorax. Scattered subpleural blebs greater on RIGHT. Minimal central peribronchial thickening. Subpleural pneumatosis bilaterally. Subpleural pneumatosis LEFT apex, without definite LEFT apex pneumothorax. No endobronchial lesions. Small focus of ground-glass infiltrate in anterior LEFT upper lobe/lingula. Mild compressive atelectasis RIGHT lower lobe. No significant pleural effusion or pulmonary mass. Upper Abdomen: Visualized upper abdomen unremarkable Musculoskeletal: No acute osseous findings. IMPRESSION: Persistent small RIGHT pneumothorax despite pigtail thoracostomy tube. Extensive chest wall, mediastinal, and cervical pneumatosis, greater on RIGHT. Scattered subpleural blebs especially at apices with small focus of ground-glass infiltrate in anterior LEFT upper lobe/lingula. Atherosclerotic calcifications including coronary arteries. Aortic Atherosclerosis (ICD10-I70.0). Electronically Signed   By: Lavonia Dana M.D.   On: 06/19/2019 12:43   DG Chest Port 1 View  Result Date: 06/18/2019 CLINICAL DATA:  Right-sided chest tube placement EXAM: PORTABLE CHEST 1 VIEW COMPARISON:  06/18/2019, 8:09 a.m.  FINDINGS: Interval placement of right chest pigtail chest tube, with substantial resolution of previously seen moderate right pneumothorax, persistent pneumothorax approximately 10%. The left lung is normally aerated. The heart and mediastinum are unremarkable. IMPRESSION: Interval placement of right chest pigtail chest tube, with substantial resolution of previously seen moderate right pneumothorax, persistent pneumothorax approximately 10%. Electronically Signed   By: Eddie Candle M.D.   On: 06/18/2019 10:57   DG Chest Port 1 View  Result Date: 06/18/2019 CLINICAL DATA:  Shortness of breath status post fall EXAM: PORTABLE CHEST 1 VIEW COMPARISON:  02/17/2014 FINDINGS: Large  right pneumothorax measuring at least 50%. No focal consolidation and pleural effusion. No left pneumothorax. Stable cardiomediastinal silhouette. No aggressive osseous lesion. IMPRESSION: Large right pneumothorax measuring at least 50%. Critical Value/emergent results were called by telephone at the time of interpretation on 06/18/2019 at 8:42 am to provider [facility] ZAMMIT , who verbally acknowledged these results. Electronically Signed   By: Kathreen Devoid   On: 06/18/2019 08:43        Scheduled Meds: . Chlorhexidine Gluconate Cloth  6 each Topical Daily  . enoxaparin (LOVENOX) injection  40 mg Subcutaneous Q24H  . nicotine  7 mg Transdermal Daily  . omega-3 acid ethyl esters  1,000 mg Oral Daily  . rosuvastatin  10 mg Oral Daily  . sodium chloride flush  3 mL Intravenous Q12H  . traZODone  50 mg Oral QHS   Continuous Infusions: . sodium chloride       LOS: 1 day    Time spent: 45 minutes    Dashanique Brownstein Darleen Crocker, DO [facility]  If 7PM-7AM, please contact night-coverage [email or web address] 06/19/2019, 1:46 PM

## 2019-06-19 NOTE — Discharge Summary (Signed)
Physician Discharge Summary  Benjamin Carlson [id number] DOB: 12-31-1945 DOA: 06/18/2019  PCP: Patient, No Pcp Per  Admit date: 06/18/2019  Discharge date: 06/19/2019 [facility] DISCHARGE  Admitted From:Home  Disposition:  AMA DISCHARGE  CODE STATUS: Full  Diet recommendation: Heart Healthy  Brief/Interim Summary: Per HPI: Benjamin Carlson a 74 y.o.malewith medical history significant forongoing tobacco abuse, mild osteoarthritis, and dyslipidemia who presented to the ED after he had a fall to his right chest while trying to pick up something from the couch earlier this morning. He had severe right-sided chest pain and shortness of breath which prompted him to come to the emergency department. He denies any lightheadedness, dizziness, or loss of consciousness. He has had no prior symptoms of cough, fever, chills, or any other symptoms.  -He was noted to have a traumatic right-sided pneumothorax and had chest tube placed by general surgery on 6/2.  This morning he was noted to have subcutaneous emphysema and repeat imaging on chest x-ray as well as CT demonstrated some air tracking from the intercostals.  There was not much to do from a surgical standpoint, but given his increased confusion and restlessness, he was sent to the ICU for closer monitoring.  He ended up pulling out his chest tube, but did not have any further symptoms and was noted to have stable vital signs.  He demanded to go home and was of decision-making capacity.  He knew his name, birthdate, location, and date accurately.  He was able to state that he understood the risk of possible death given his ongoing pneumothorax with instability and emphysema.  He states that he might return back to the hospital if needed.  His girlfriend was at bedside and had tried to convince him to stay in the hospital, but he would not comply.  He has signed AMA paperwork and will discharge [place].  He does not appear to be a threat to  himself in terms of suicidal ideations or potential mutilation, nor does he appear to be a threat to others at this point in time.  His decision-making capacity is not influenced by any illicit drugs or other pharmacology at this point in time.  I have evaluated the medical decision-making capacity of this patient at bedside and have determined that he/she is definitely/probably in/capable based on the following criteria:  1) Is able to understand the active medical problem 2) Is able to understand the proposed treatment plan 3) Is able to understand alternatives to the proposed treatment plan 4) Is able to understand the option of refusing the proposed treatment plan 5) Is able to appreciate the foreseeable consequences of accepting and refusing the proposed treatment plan  Additionally, the patient's decision does not appear to influenced by any sign of depression or delusion/psychosis.   Time taken to administer capacity evaluation:15 minutes.  Surrogate decision maker: no  Discharge Diagnoses:  Active Problems:   Pneumothorax   Subcutaneous emphysema ([place])   No Known Allergies  Consultations:  General surgery  Pulmonology   Procedures/Studies: DG Chest 1 View  Result Date: 06/19/2019 CLINICAL DATA:  Pneumothorax. EXAM: CHEST  1 VIEW 4:21 p.m. COMPARISON:  06/19/2019 at 7:49 a.m. and chest CT dated 06/19/2019 at 12:17 p.m. FINDINGS: The right-sided chest tube is been removed. Extensive subcutaneous emphysema is unchanged. Heart size and vascularity are normal. Mediastinal emphysema is also unchanged. There is a tiny peripheral right pneumothorax which is unchanged since the prior CT scan. No infiltrates or effusions.  No acute  bone abnormality. IMPRESSION: 1. No change in the tiny peripheral right pneumothorax after chest tube removal. 2. No change in the extensive subcutaneous and mediastinal emphysema. Electronically Signed   By: Lorriane Shire M.D.   On: 06/19/2019 16:52   DG  Chest 2 View  Result Date: 06/19/2019 CLINICAL DATA:  Pneumothorax follow-up EXAM: CHEST - 2 VIEW COMPARISON:  Yesterday FINDINGS: Right-sided chest tube in similar position. New pneumomediastinum and increased right pneumothorax, now seen at the base. Extensive chest wall emphysema. A left apical lucency may be extrapleural gas given constellation of findings, or trace pneumothorax. Normal heart size. IMPRESSION: 1. Increased right pneumothorax, now also seen at the base. 2. New pneumomediastinum and extensive chest wall emphysema. 3. Lucency at the left apex could be extrapleural or pneumothorax, consider short follow-up. Electronically Signed   By: Monte Fantasia M.D.   On: 06/19/2019 10:29   DG Ribs Unilateral Right  Result Date: 06/19/2019 CLINICAL DATA:  Chest tube EXAM: RIGHT RIBS - 2 VIEW COMPARISON:  Chest x-ray from the same time in yesterday FINDINGS: Known right pneumothorax and pneumomediastinum. A right chest tube is in place. No visible rib fracture or erosion, but limited by the degree of soft tissue emphysema. IMPRESSION: Negative right rib series.  Referenced contemporaneous chest x-ray. Electronically Signed   By: Monte Fantasia M.D.   On: 06/19/2019 10:36   CT CHEST WO CONTRAST  Result Date: 06/19/2019 CLINICAL DATA:  Pneumothorax, chest tube placement, subsequent chest wall emphysema and pneumomediastinum EXAM: CT CHEST WITHOUT CONTRAST TECHNIQUE: Multidetector CT imaging of the chest was performed following the standard protocol without IV contrast. Sagittal and coronal MPR images reconstructed from axial data set. 06/19/2019 COMPARISON:  Chest radiograph FINDINGS: Cardiovascular: Atherosclerotic calcifications aorta and coronary arteries. Aorta normal caliber. Heart unremarkable. Mediastinum/Nodes: Extensive mediastinal pneumatosis. Extensive chest wall pneumatosis RIGHT greater than LEFT. Extension of chest wall and mediastinal pneumatosis into the inferior cervical regions  bilaterally. Base of cervical region otherwise normal appearance. Esophagus unremarkable. Lungs/Pleura: Pigtail RIGHT thoracostomy tube at the anterior mid chest. Persistent small RIGHT pneumothorax. Scattered subpleural blebs greater on RIGHT. Minimal central peribronchial thickening. Subpleural pneumatosis bilaterally. Subpleural pneumatosis LEFT apex, without definite LEFT apex pneumothorax. No endobronchial lesions. Small focus of ground-glass infiltrate in anterior LEFT upper lobe/lingula. Mild compressive atelectasis RIGHT lower lobe. No significant pleural effusion or pulmonary mass. Upper Abdomen: Visualized upper abdomen unremarkable Musculoskeletal: No acute osseous findings. IMPRESSION: Persistent small RIGHT pneumothorax despite pigtail thoracostomy tube. Extensive chest wall, mediastinal, and cervical pneumatosis, greater on RIGHT. Scattered subpleural blebs especially at apices with small focus of ground-glass infiltrate in anterior LEFT upper lobe/lingula. Atherosclerotic calcifications including coronary arteries. Aortic Atherosclerosis (ICD10-I70.0). Electronically Signed   By: Lavonia Dana M.D.   On: 06/19/2019 12:43   DG Chest Port 1 View  Result Date: 06/18/2019 CLINICAL DATA:  Right-sided chest tube placement EXAM: PORTABLE CHEST 1 VIEW COMPARISON:  06/18/2019, 8:09 a.m. FINDINGS: Interval placement of right chest pigtail chest tube, with substantial resolution of previously seen moderate right pneumothorax, persistent pneumothorax approximately 10%. The left lung is normally aerated. The heart and mediastinum are unremarkable. IMPRESSION: Interval placement of right chest pigtail chest tube, with substantial resolution of previously seen moderate right pneumothorax, persistent pneumothorax approximately 10%. Electronically Signed   By: Eddie Candle M.D.   On: 06/18/2019 10:57   DG Chest Port 1 View  Result Date: 06/18/2019 CLINICAL DATA:  Shortness of breath status post fall EXAM: PORTABLE  CHEST 1 VIEW COMPARISON:  02/17/2014 FINDINGS:  Large right pneumothorax measuring at least 50%. No focal consolidation and pleural effusion. No left pneumothorax. Stable cardiomediastinal silhouette. No aggressive osseous lesion. IMPRESSION: Large right pneumothorax measuring at least 50%. Critical Value/emergent results were called by telephone at the time of interpretation on 06/18/2019 at 8:42 am to provider [facility] ZAMMIT , who verbally acknowledged these results. Electronically Signed   By: Kathreen Devoid   On: 06/18/2019 08:43     Discharge Exam: Vitals:   06/19/19 1610 06/19/19 1630  BP: (!) 167/91 139/61  Pulse:  (!) 114  Resp: (!) 24 19  Temp:    SpO2:  96%   Vitals:   06/19/19 1330 06/19/19 1430 06/19/19 1610 06/19/19 1630  BP: 137/76 (!) 143/57 (!) 167/91 139/61  Pulse: 92 94  (!) 114  Resp: 18 20 (!) 24 19  Temp:      TempSrc:      SpO2: 99% 97%  96%  Weight:      Height:          The results of significant diagnostics from this hospitalization (including imaging, microbiology, ancillary and laboratory) are listed below for reference.     Microbiology: Recent Results (from the past 240 hour(s))  SARS Coronavirus 2 by RT PCR (hospital order, performed in [facility] hospital lab) Nasopharyngeal Nasopharyngeal Swab     Status: None   Collection Time: 06/18/19  8:28 AM   Specimen: Nasopharyngeal Swab  Result Value Ref Range Status   SARS Coronavirus 2 NEGATIVE NEGATIVE Final    Comment: (NOTE) SARS-CoV-2 target nucleic acids are NOT DETECTED. The SARS-CoV-2 RNA is generally detectable in upper and lower respiratory specimens during the acute phase of infection. The lowest concentration of SARS-CoV-2 viral copies this assay can detect is 250 copies / mL. A negative result does not preclude SARS-CoV-2 infection and should not be used as the sole basis for treatment or other patient management decisions.  A negative result may occur with improper specimen collection /  handling, submission of specimen other than nasopharyngeal swab, presence of viral mutation(s) within the areas targeted by this assay, and inadequate number of viral copies (<250 copies / mL). A negative result must be combined with clinical observations, patient history, and epidemiological information. Fact Sheet for Patients:   StrictlyIdeas.no Fact Sheet for Healthcare Providers: BankingDealers.co.za This test is not yet approved or cleared  by the [place] FDA and has been authorized for detection and/or diagnosis of SARS-CoV-2 by FDA under an Emergency Use Authorization (EUA).  This EUA will remain in effect (meaning this test can be used) for the duration of the COVID-19 declaration under Section 564(b)(1) of the Act, 21 U.S.C. section 360bbb-3(b)(1), unless the authorization is terminated or revoked sooner. Performed at [facility], [address] [place], [place] [address]      Labs: BNP (last 3 results) No results for input(s): BNP in the last 8760 hours. Basic Metabolic Panel: Recent Labs  Lab 06/18/19 0900 06/19/19 0636  NA 138 136  K 4.0 3.5  CL 100 100  CO2 26 27  GLUCOSE 158* 164*  BUN 11 12  CREATININE 0.82 0.75  CALCIUM 9.0 8.8*  MG  --  1.8   Liver Function Tests: Recent Labs  Lab 06/18/19 0900  AST 21  ALT 14  ALKPHOS 81  BILITOT 0.7  PROT 7.7  ALBUMIN 4.1   No results for input(s): LIPASE, AMYLASE in the last 168 hours. No results for input(s): AMMONIA in the last 168 hours. CBC:  Recent Labs  Lab 06/18/19 0900 06/19/19 0636  WBC 11.9* 10.4  NEUTROABS 9.0*  --   HGB 14.0 14.0  HCT 42.7 42.3  MCV 96.6 95.5  PLT 223 196   Cardiac Enzymes: Recent Labs  Lab 06/18/19 0900  CKTOTAL 72   BNP: Invalid input(s): POCBNP CBG: No results for input(s): GLUCAP in the last 168 hours. D-Dimer No results for input(s): DDIMER in the last 72 hours. Hgb A1c Recent Labs    06/18/19 0900   HGBA1C 6.4*   Lipid Profile No results for input(s): CHOL, HDL, LDLCALC, TRIG, CHOLHDL, LDLDIRECT in the last 72 hours. Thyroid function studies No results for input(s): TSH, T4TOTAL, T3FREE, THYROIDAB in the last 72 hours.  Invalid input(s): FREET3 Anemia work up No results for input(s): VITAMINB12, FOLATE, FERRITIN, TIBC, IRON, RETICCTPCT in the last 72 hours. Urinalysis    Component Value Date/Time   COLORURINE YELLOW 01/09/2011 1106   APPEARANCEUR CLEAR 01/09/2011 1106   LABSPEC >1.030 (H) 01/09/2011 1106   PHURINE 6.0 01/09/2011 1106   GLUCOSEU NEGATIVE 01/09/2011 1106   HGBUR NEGATIVE 01/09/2011 1106   BILIRUBINUR MODERATE (A) 01/09/2011 1106   KETONESUR TRACE (A) 01/09/2011 1106   PROTEINUR 30 (A) 01/09/2011 1106   UROBILINOGEN 1.0 01/09/2011 1106   NITRITE NEGATIVE 01/09/2011 1106   LEUKOCYTESUR NEGATIVE 01/09/2011 1106   Sepsis Labs Invalid input(s): PROCALCITONIN,  WBC,  LACTICIDVEN Microbiology Recent Results (from the past 240 hour(s))  SARS Coronavirus 2 by RT PCR (hospital order, performed in [place] hospital lab) Nasopharyngeal Nasopharyngeal Swab     Status: None   Collection Time: 06/18/19  8:28 AM   Specimen: Nasopharyngeal Swab  Result Value Ref Range Status   SARS Coronavirus 2 NEGATIVE NEGATIVE Final    Comment: (NOTE) SARS-CoV-2 target nucleic acids are NOT DETECTED. The SARS-CoV-2 RNA is generally detectable in upper and lower respiratory specimens during the acute phase of infection. The lowest concentration of SARS-CoV-2 viral copies this assay can detect is 250 copies / mL. A negative result does not preclude SARS-CoV-2 infection and should not be used as the sole basis for treatment or other patient management decisions.  A negative result may occur with improper specimen collection / handling, submission of specimen other than nasopharyngeal swab, presence of viral mutation(s) within the areas targeted by this assay, and inadequate number  of viral copies (<250 copies / mL). A negative result must be combined with clinical observations, patient history, and epidemiological information. Fact Sheet for Patients:   StrictlyIdeas.no Fact Sheet for Healthcare Providers: BankingDealers.co.za This test is not yet approved or cleared  by the [place] FDA and has been authorized for detection and/or diagnosis of SARS-CoV-2 by FDA under an Emergency Use Authorization (EUA).  This EUA will remain in effect (meaning this test can be used) for the duration of the COVID-19 declaration under Section 564(b)(1) of the Act, 21 U.S.C. section 360bbb-3(b)(1), unless the authorization is terminated or revoked sooner. Performed at [facility], [address] [place], [place] [address]      Time coordinating discharge: 35 minutes  SIGNED:   Rodena Goldmann, DO [facility] 06/19/2019, 6:07 PM  If 7PM-7AM, please contact night-coverage [email or web address]

## 2019-06-19 NOTE — Progress Notes (Addendum)
[facility] Progress Note     Subjective: Patient says he is not hurting and is calm and cooperative right now. Has been confused.  The dressing is intact and nothing looks like it has changed.  Chest tube went in easily yesterday and no issues.  Reviewed Xray with Dr. Thornton Papas radiology. When I had looked at home the subcutaneous air made me miss the pneumothorax on the right, but he showed me the area.    Best guess right now is that the patient is so thin that the chest tube insertion site is leaking around it? CT chest has been ordered stat to ensure nothing else going on.   Objective: Vital signs in last 24 hours: Temp:  [98.2 F (36.8 C)-99.5 F (37.5 C)] 98.9 F (37.2 C) (06/03 0900) Pulse Rate:  [79-113] 79 (06/03 0900) Resp:  [13-26] 18 (06/03 0900) BP: (110-186)/(55-83) 110/55 (06/03 0900) SpO2:  [92 %-100 %] 98 % (06/03 0900) Weight:  [79.4 kg] 79.4 kg (06/02 1430)    Intake/Output from previous day: 06/02 0701 - 06/03 0700 In: 243 [P.O.:240; I.V.:3] Out: 900 [Urine:900] Intake/Output this shift: Total I/O In: 240 [P.O.:240] Out: 350 [Urine:350]  General appearance: alert, cooperative and no distress Resp: chest tube in place and dressing in place, no air leak noted, pleuravac looks appropriate h  Lab Results:  Recent Labs    06/18/19 0900 06/19/19 0636  WBC 11.9* 10.4  HGB 14.0 14.0  HCT 42.7 42.3  PLT 223 196   BMET Recent Labs    06/18/19 0900 06/19/19 0636  NA 138 136  K 4.0 3.5  CL 100 100  CO2 26 27  GLUCOSE 158* 164*  BUN 11 12  CREATININE 0.82 0.75  CALCIUM 9.0 8.8*   PT/INR No results for input(s): LABPROT, INR in the last 72 hours.  Studies/Results: DG Chest 2 View  Result Date: 06/19/2019 CLINICAL DATA:  Pneumothorax follow-up EXAM: CHEST - 2 VIEW COMPARISON:  Yesterday FINDINGS: Right-sided chest tube in similar position. New pneumomediastinum and increased right pneumothorax, now seen at the base. Extensive chest  wall emphysema. A left apical lucency may be extrapleural gas given constellation of findings, or trace pneumothorax. Normal heart size. IMPRESSION: 1. Increased right pneumothorax, now also seen at the base. 2. New pneumomediastinum and extensive chest wall emphysema. 3. Lucency at the left apex could be extrapleural or pneumothorax, consider short follow-up. Electronically Signed   By: Monte Fantasia M.D.   On: 06/19/2019 10:29   DG Ribs Unilateral Right  Result Date: 06/19/2019 CLINICAL DATA:  Chest tube EXAM: RIGHT RIBS - 2 VIEW COMPARISON:  Chest x-ray from the same time in yesterday FINDINGS: Known right pneumothorax and pneumomediastinum. A right chest tube is in place. No visible rib fracture or erosion, but limited by the degree of soft tissue emphysema. IMPRESSION: Negative right rib series.  Referenced contemporaneous chest x-ray. Electronically Signed   By: Monte Fantasia M.D.   On: 06/19/2019 10:36   DG Chest Port 1 View  Result Date: 06/18/2019 CLINICAL DATA:  Right-sided chest tube placement EXAM: PORTABLE CHEST 1 VIEW COMPARISON:  06/18/2019, 8:09 a.m. FINDINGS: Interval placement of right chest pigtail chest tube, with substantial resolution of previously seen moderate right pneumothorax, persistent pneumothorax approximately 10%. The left lung is normally aerated. The heart and mediastinum are unremarkable. IMPRESSION: Interval placement of right chest pigtail chest tube, with substantial resolution of previously seen moderate right pneumothorax, persistent pneumothorax approximately 10%. Electronically Signed   By: Cristie Hem  Laqueta Carina M.D.   On: 06/18/2019 10:57   DG Chest Port 1 View  Result Date: 06/18/2019 CLINICAL DATA:  Shortness of breath status post fall EXAM: PORTABLE CHEST 1 VIEW COMPARISON:  02/17/2014 FINDINGS: Large right pneumothorax measuring at least 50%. No focal consolidation and pleural effusion. No left pneumothorax. Stable cardiomediastinal silhouette. No aggressive osseous  lesion. IMPRESSION: Large right pneumothorax measuring at least 50%. Critical Value/emergent results were called by telephone at the time of interpretation on 06/18/2019 at 8:42 am to provider [facility] ZAMMIT , who verbally acknowledged these results. Electronically Signed   By: Kathreen Devoid   On: 06/18/2019 08:43    Anti-infectives: Anti-infectives (From admission, onward)   None      Assessment/Plan: Benjamin Carlson is a 74 yo with a right pneumoathorax s/p chest tube placement. Subcutaneous air on the CXR. No complaints or SOB reported. Reviewed CXR with radiology. Likely is just leaking around the tube as the side holes appear all in the cavity. CT chest pending. May need to place another stitch around the tube, will see once CT is done.   Discussed with Dr. Manuella Ghazi and Dr. Melvyn Novas.    LOS: 1 day   Greater than 50% of the 25 minute visit was spent in coordination of care regarding the chest tube, plan for CT to evaluate further.     Virl Cagey 06/19/2019

## 2019-06-19 NOTE — Progress Notes (Addendum)
Attending RN was at the nurses station and saw patient walking towards the nurses station. Pt had pulled chest tube out from his side. Pt is in no distress and states he is ready to "get out of here and go to his crib." 3- sided dressing applied, patient VSS. MD made aware. STAT CXR ordered. Safety sitter order placed. Chest tube will more than likely have to go back in. Will continue to monitor.

## 2019-06-19 NOTE — Progress Notes (Signed)
Pt up in chair, alert and oriented x4 but continues to ask same questions over and over about going home. Denies c/o pain. Pt with noted sq air across right and left upper chest. MD Manuella Ghazi and Constance Haw notified. Pt without SOB, VSS. Skin warm and dry. DDI to right chest, pleurovac intact with minimal drainage.

## 2019-06-19 NOTE — Progress Notes (Addendum)
Pt sitting in recliner, calm and cooperative. Keeps asking when he can go home and when "all this mess" is going to be removed from his chest Advised pt that MD is reviewing chest xray to decide on best course of action, as he has sq air palpable on his chest and back and it may be a problem with his chest tube. Pt says, "Its too hard for me to stay here. I gotta move around, go outside. I just can't keep sitting here!". Acknowledged that it is hard to be confined to hospital and that he is doing a great job being patient and calm while waiting for MD and further treatment.  Pt now with visible sq air to right upper chest/neck area, also extending to right posterior chest wall. Pt denies SOB, resp even and nonlabored. VSS, SaO2 98% on RA, resp 16/min. Pt denies any c/o pain.  MD's Manuella Ghazi and Constance Haw notified of increased SQ air and current vital signs.  Spoke via phone with pt's daughter Lenna Sciara earlier this am and updated her on sq air and chest x-ray. States understanding. Asked to be updated if any further changes.  Also, radiology called x-ray report showing pneumothorax and sq air. Dr. Constance Haw and Dr. Manuella Ghazi already aware of findings and are discussing treatment plan at this time.

## 2019-06-19 NOTE — Progress Notes (Signed)
Daughter called to the desk and stated her father was sleeping and to hold off on the PRN at this time.

## 2019-06-19 NOTE — Progress Notes (Signed)
Pt down to radiology dept via [facility] for chest x-ray. Denies c/o SOB or pain. Pleurovac intact, DDI to right chest.

## 2019-06-19 NOTE — Progress Notes (Signed)
NAME:  Benjamin Carlson, MRN:  [id number], DOB:  01-29-1945, LOS: 1 ADMISSION DATE:  06/18/2019, CONSULTATION DATE:  06/18/19 REFERRING MD:  Shah/ Triad, CHIEF COMPLAINT:  R ptx   Brief History   32 yowm active smoker with minmal am cough/congestion but no h/o limiting doe fell and struck R ant chest on edge of table am 6/2 and came to [facility] with cp/sob and found to have R PTX so R CT by Dr Constance Haw and admitted by TRiad and PCCM service asked to consult at that point  History of present illness     74 y.o. male with medical history significant for ongoing tobacco abuse, mild osteoarthritis, and dyslipidemia who presented to the ED after he had a fall to his right chest while trying to pick up something from the couch earlier this morning.  He had severe right-sided chest pain and shortness of breath which prompted him to come to the emergency department.  He denies any lightheadedness, dizziness, or loss of consciousness.  He has had no prior symptoms of cough, fever, chills, or any other symptoms.   ED Course: Patient was noted to have a 50% pneumothorax on chest x-ray.  General surgery was consulted for placement of chest tube and this has resolved to approximately 10% on repeat chest x-ray.  Patient had no further shortness of breath   on room air.  Covid testing negative.    Significant Hospital Events   Admit to floor pm 6/2   Consults:  General surgery/ [facility]  Procedures:  R chest tube 6/2  Constance Haw)   Significant Diagnostic Tests:   CT chest 6/3  R chest tube in good position   Micro Data:  Covid 19  PCR  6/2  Neg  Antimicrobials:    Scheduled Meds: . Chlorhexidine Gluconate Cloth  6 each Topical Daily  . enoxaparin (LOVENOX) injection  40 mg Subcutaneous Q24H  . nicotine  7 mg Transdermal Daily  . omega-3 acid ethyl esters  1,000 mg Oral Daily  . rosuvastatin  10 mg Oral Daily  . sodium chloride flush  3 mL Intravenous Q12H  . traZODone  50 mg Oral QHS   Continuous  Infusions: . sodium chloride     PRN Meds:.sodium chloride, acetaminophen, ALPRAZolam, haloperidol lactate, ondansetron **OR** ondansetron (ZOFRAN) IV, oxyCODONE, sodium chloride flush    Interim history/subjective:  Feeling better/ not wearing 02   Objective   Blood pressure (!) 143/57, pulse 94, temperature 98.9 F (37.2 C), temperature source Oral, resp. rate 20, height 5\' 9"  (1.753 m), weight 79.4 kg, SpO2 97 %.        Intake/Output Summary (Last 24 hours) at 06/19/2019 1550 Last data filed at 06/19/2019 1205 Gross per 24 hour  Intake 485 ml  Output 1290 ml  Net -805 ml   Filed Weights   06/18/19 0746 06/18/19 1430  Weight: 77.1 kg 79.4 kg    Examination: Tmax 99.5  Pt alert, approp nad @ sitting up in bed  No jvd Oropharynx clear,  mucosa nl Neck supple Lungs distant bs bilaterally/ crepitance noted  RRR no s3 or or sign murmur Abd soft/ nl  excursion  Extr warm with no edema or clubbing noted Neuro  Sensorium intact,  no apparent motor deficits   I personally reviewed images and agree with radiology impression as follows:  CT chest  6/3  Persistent small RIGHT pneumothorax despite pigtail thoracostomy tube. Extensive chest wall, mediastinal, and cervical pneumatosis, greater on RIGHT. Scattered subpleural  blebs especially at apices with small focus of ground-glass infiltrate in anterior LEFT upper lobe/lingula.  .       Assessment & Plan:  1) traumatic R PTX in smoker with ? Underlying cb/copd  - No rib fx on rib detail 6/3 and CT chest shows good posiong >>> continue suction / chest tube management per Dr Constance Haw     2) Active smoker .    >>> needs pft's in 3 months / will arrange from [facility] office      Labs   CBC: Recent Labs  Lab 06/18/19 0900 06/19/19 0636  WBC 11.9* 10.4  NEUTROABS 9.0*  --   HGB 14.0 14.0  HCT 42.7 42.3  MCV 96.6 95.5  PLT 223 [id number]    Basic Metabolic Panel: Recent Labs  Lab 06/18/19 0900 06/19/19 0636    NA 138 136  K 4.0 3.5  CL 100 100  CO2 26 27  GLUCOSE 158* 164*  BUN 11 12  CREATININE 0.82 0.75  CALCIUM 9.0 8.8*  MG  --  1.8   GFR: Estimated Creatinine Clearance: 83.5 mL/min (by C-G formula based on SCr of 0.75 mg/dL). Recent Labs  Lab 06/18/19 0900 06/19/19 0636  WBC 11.9* 10.4    Liver Function Tests: Recent Labs  Lab 06/18/19 0900  AST 21  ALT 14  ALKPHOS 81  BILITOT 0.7  PROT 7.7  ALBUMIN 4.1   No results for input(s): LIPASE, AMYLASE in the last 168 hours. No results for input(s): AMMONIA in the last 168 hours.  ABG No results found for: PHART, PCO2ART, PO2ART, HCO3, TCO2, ACIDBASEDEF, O2SAT   Coagulation Profile: No results for input(s): INR, PROTIME in the last 168 hours.  Cardiac Enzymes: Recent Labs  Lab 06/18/19 0900  CKTOTAL 72    HbA1C: Hgb A1c MFr Bld  Date/Time Value Ref Range Status  06/18/2019 09:00 AM 6.4 (H) 4.8 - 5.6 % Final    Comment:    (NOTE) Pre diabetes:          5.7%-6.4% Diabetes:              >6.4% Glycemic control for   <7.0% adults with diabetes     CBG: No results for input(s): GLUCAP in the last 168 hours.   Christinia Gully, MD Pulmonary and [place] [phone] After 6:00 PM or weekends, use Beeper [phone]  After 7:00 pm call Elink  [phone]

## 2019-06-19 NOTE — Progress Notes (Signed)
Pt remains alert and without complaint. Per MD's Manuella Ghazi and Constance Haw, pt to get 2 view chest ASAP. Radiology notified and coming up to get patient now.

## 2019-06-19 NOTE — Progress Notes (Signed)
Pts daughter Irving Shows called and made aware that patient left belongings in room. Daughter stated to leave at front dest and she would pick up tomorrow afternoon

## 2019-06-19 NOTE — Progress Notes (Signed)
Pt was taken to CT via wheelchair with pleurovac intact.  Pt report given to [place], RN, all pt belongings transferred down to ICU bed #1. Pt then to room ICU #1 from CT, ambulated to bed without difficulty. Suction reapplied to pleurovac. Additional bedside report given to ICU staff.  Pt's daughter notified that pt has now been moved to ICU, states understanding.

## 2019-06-20 ENCOUNTER — Other Ambulatory Visit: Payer: Self-pay

## 2019-06-20 ENCOUNTER — Inpatient Hospital Stay (HOSPITAL_COMMUNITY): Payer: Medicare HMO

## 2019-06-20 ENCOUNTER — Inpatient Hospital Stay (HOSPITAL_COMMUNITY)
Admission: EM | Admit: 2019-06-20 | Discharge: 2019-07-17 | DRG: 207 | Disposition: E | Payer: Medicare HMO | Attending: General Surgery | Admitting: General Surgery

## 2019-06-20 ENCOUNTER — Emergency Department (HOSPITAL_COMMUNITY): Payer: Medicare HMO

## 2019-06-20 DIAGNOSIS — I5043 Acute on chronic combined systolic (congestive) and diastolic (congestive) heart failure: Secondary | ICD-10-CM | POA: Diagnosis not present

## 2019-06-20 DIAGNOSIS — R000 Tachycardia, unspecified: Secondary | ICD-10-CM | POA: Diagnosis present

## 2019-06-20 DIAGNOSIS — Z6822 Body mass index (BMI) 22.0-22.9, adult: Secondary | ICD-10-CM | POA: Diagnosis not present

## 2019-06-20 DIAGNOSIS — R64 Cachexia: Secondary | ICD-10-CM | POA: Diagnosis present

## 2019-06-20 DIAGNOSIS — N179 Acute kidney failure, unspecified: Secondary | ICD-10-CM | POA: Diagnosis present

## 2019-06-20 DIAGNOSIS — R451 Restlessness and agitation: Secondary | ICD-10-CM

## 2019-06-20 DIAGNOSIS — E876 Hypokalemia: Secondary | ICD-10-CM | POA: Diagnosis not present

## 2019-06-20 DIAGNOSIS — M1990 Unspecified osteoarthritis, unspecified site: Secondary | ICD-10-CM | POA: Diagnosis present

## 2019-06-20 DIAGNOSIS — J189 Pneumonia, unspecified organism: Secondary | ICD-10-CM | POA: Diagnosis present

## 2019-06-20 DIAGNOSIS — J9601 Acute respiratory failure with hypoxia: Secondary | ICD-10-CM | POA: Diagnosis not present

## 2019-06-20 DIAGNOSIS — J9600 Acute respiratory failure, unspecified whether with hypoxia or hypercapnia: Secondary | ICD-10-CM | POA: Diagnosis not present

## 2019-06-20 DIAGNOSIS — E785 Hyperlipidemia, unspecified: Secondary | ICD-10-CM | POA: Diagnosis not present

## 2019-06-20 DIAGNOSIS — Z9911 Dependence on respirator [ventilator] status: Secondary | ICD-10-CM

## 2019-06-20 DIAGNOSIS — Z20822 Contact with and (suspected) exposure to covid-19: Secondary | ICD-10-CM | POA: Diagnosis present

## 2019-06-20 DIAGNOSIS — Z515 Encounter for palliative care: Secondary | ICD-10-CM | POA: Diagnosis not present

## 2019-06-20 DIAGNOSIS — T797XXA Traumatic subcutaneous emphysema, initial encounter: Secondary | ICD-10-CM | POA: Diagnosis present

## 2019-06-20 DIAGNOSIS — R9431 Abnormal electrocardiogram [ECG] [EKG]: Secondary | ICD-10-CM | POA: Diagnosis not present

## 2019-06-20 DIAGNOSIS — Z66 Do not resuscitate: Secondary | ICD-10-CM

## 2019-06-20 DIAGNOSIS — R0989 Other specified symptoms and signs involving the circulatory and respiratory systems: Secondary | ICD-10-CM

## 2019-06-20 DIAGNOSIS — Z789 Other specified health status: Secondary | ICD-10-CM

## 2019-06-20 DIAGNOSIS — Z8719 Personal history of other diseases of the digestive system: Secondary | ICD-10-CM | POA: Diagnosis not present

## 2019-06-20 DIAGNOSIS — S270XXA Traumatic pneumothorax, initial encounter: Principal | ICD-10-CM | POA: Diagnosis present

## 2019-06-20 DIAGNOSIS — I361 Nonrheumatic tricuspid (valve) insufficiency: Secondary | ICD-10-CM | POA: Diagnosis not present

## 2019-06-20 DIAGNOSIS — F17210 Nicotine dependence, cigarettes, uncomplicated: Secondary | ICD-10-CM | POA: Diagnosis present

## 2019-06-20 DIAGNOSIS — J441 Chronic obstructive pulmonary disease with (acute) exacerbation: Secondary | ICD-10-CM | POA: Diagnosis present

## 2019-06-20 DIAGNOSIS — E7800 Pure hypercholesterolemia, unspecified: Secondary | ICD-10-CM | POA: Diagnosis present

## 2019-06-20 DIAGNOSIS — J939 Pneumothorax, unspecified: Secondary | ICD-10-CM | POA: Diagnosis present

## 2019-06-20 DIAGNOSIS — Z803 Family history of malignant neoplasm of breast: Secondary | ICD-10-CM

## 2019-06-20 DIAGNOSIS — I4891 Unspecified atrial fibrillation: Secondary | ICD-10-CM

## 2019-06-20 DIAGNOSIS — Z801 Family history of malignant neoplasm of trachea, bronchus and lung: Secondary | ICD-10-CM

## 2019-06-20 DIAGNOSIS — W19XXXA Unspecified fall, initial encounter: Secondary | ICD-10-CM | POA: Diagnosis present

## 2019-06-20 DIAGNOSIS — J440 Chronic obstructive pulmonary disease with acute lower respiratory infection: Secondary | ICD-10-CM | POA: Diagnosis present

## 2019-06-20 DIAGNOSIS — Z4659 Encounter for fitting and adjustment of other gastrointestinal appliance and device: Secondary | ICD-10-CM

## 2019-06-20 DIAGNOSIS — R1110 Vomiting, unspecified: Secondary | ICD-10-CM | POA: Diagnosis not present

## 2019-06-20 DIAGNOSIS — G9340 Encephalopathy, unspecified: Secondary | ICD-10-CM | POA: Diagnosis not present

## 2019-06-20 DIAGNOSIS — I480 Paroxysmal atrial fibrillation: Secondary | ICD-10-CM | POA: Diagnosis present

## 2019-06-20 DIAGNOSIS — K5730 Diverticulosis of large intestine without perforation or abscess without bleeding: Secondary | ICD-10-CM | POA: Diagnosis present

## 2019-06-20 DIAGNOSIS — E440 Moderate protein-calorie malnutrition: Secondary | ICD-10-CM | POA: Diagnosis present

## 2019-06-20 DIAGNOSIS — I340 Nonrheumatic mitral (valve) insufficiency: Secondary | ICD-10-CM | POA: Diagnosis not present

## 2019-06-20 DIAGNOSIS — G312 Degeneration of nervous system due to alcohol: Secondary | ICD-10-CM | POA: Diagnosis present

## 2019-06-20 DIAGNOSIS — I110 Hypertensive heart disease with heart failure: Secondary | ICD-10-CM | POA: Diagnosis present

## 2019-06-20 DIAGNOSIS — I5041 Acute combined systolic (congestive) and diastolic (congestive) heart failure: Secondary | ICD-10-CM | POA: Diagnosis not present

## 2019-06-20 DIAGNOSIS — F10939 Alcohol use, unspecified with withdrawal, unspecified: Secondary | ICD-10-CM | POA: Diagnosis not present

## 2019-06-20 DIAGNOSIS — Z79899 Other long term (current) drug therapy: Secondary | ICD-10-CM

## 2019-06-20 DIAGNOSIS — J9690 Respiratory failure, unspecified, unspecified whether with hypoxia or hypercapnia: Secondary | ICD-10-CM

## 2019-06-20 DIAGNOSIS — Z7289 Other problems related to lifestyle: Secondary | ICD-10-CM

## 2019-06-20 DIAGNOSIS — I959 Hypotension, unspecified: Secondary | ICD-10-CM | POA: Diagnosis not present

## 2019-06-20 LAB — COMPREHENSIVE METABOLIC PANEL
ALT: 15 U/L (ref 0–44)
ALT: 14 U/L (ref 0–44)
AST: 20 U/L (ref 15–41)
AST: 23 U/L (ref 15–41)
Albumin: 3.4 g/dL — ABNORMAL LOW (ref 3.5–5.0)
Albumin: 3 g/dL — ABNORMAL LOW (ref 3.5–5.0)
Alkaline Phosphatase: 68 U/L (ref 38–126)
Alkaline Phosphatase: 64 U/L (ref 38–126)
Anion gap: 14 (ref 5–15)
Anion gap: 10 (ref 5–15)
BUN: 18 mg/dL (ref 8–23)
BUN: 14 mg/dL (ref 8–23)
CO2: 24 mmol/L (ref 22–32)
CO2: 26 mmol/L (ref 22–32)
Calcium: 9 mg/dL (ref 8.9–10.3)
Calcium: 8.7 mg/dL — ABNORMAL LOW (ref 8.9–10.3)
Chloride: 99 mmol/L (ref 98–111)
Chloride: 104 mmol/L (ref 98–111)
Creatinine, Ser: 1.03 mg/dL (ref 0.61–1.24)
Creatinine, Ser: 0.91 mg/dL (ref 0.61–1.24)
GFR calc Af Amer: >60 mL/min (ref >60)
GFR calc Af Amer: >60 mL/min (ref >60)
GFR calc non Af Amer: >60 mL/min (ref >60)
GFR calc non Af Amer: >60 mL/min (ref >60)
Glucose, Bld: 148 mg/dL — ABNORMAL HIGH (ref 70–99)
Glucose, Bld: 143 mg/dL — ABNORMAL HIGH (ref 70–99)
Potassium: 3.5 mmol/L (ref 3.5–5.1)
Potassium: 3.5 mmol/L (ref 3.5–5.1)
Sodium: 137 mmol/L (ref 135–145)
Sodium: 140 mmol/L (ref 135–145)
Total Bilirubin: 1 mg/dL (ref 0.3–1.2)
Total Bilirubin: 0.9 mg/dL (ref 0.3–1.2)
Total Protein: 6.8 g/dL (ref 6.5–8.1)
Total Protein: 6.5 g/dL (ref 6.5–8.1)

## 2019-06-20 LAB — CBC
HCT: 39.4 % (ref 39.0–52.0)
Hemoglobin: 13 g/dL (ref 13.0–17.0)
MCH: 31.9 pg (ref 26.0–34.0)
MCHC: 33 g/dL (ref 30.0–36.0)
MCV: 96.8 fL (ref 80.0–100.0)
Platelets: 177 10*3/uL (ref 150–400)
RBC: 4.07 MIL/uL — ABNORMAL LOW (ref 4.22–5.81)
RDW: 13.1 % (ref 11.5–15.5)
WBC: 7.4 10*3/uL (ref 4.0–10.5)
nRBC: 0 % (ref 0.0–0.2)

## 2019-06-20 LAB — CBC WITH DIFFERENTIAL/PLATELET
Abs Immature Granulocytes: 0.06 10*3/uL (ref 0.00–0.07)
Basophils Absolute: 0 10*3/uL (ref 0.0–0.1)
Basophils Relative: 0 %
Eosinophils Absolute: 0.1 10*3/uL (ref 0.0–0.5)
Eosinophils Relative: 1 %
HCT: 42.6 % (ref 39.0–52.0)
Hemoglobin: 13.8 g/dL (ref 13.0–17.0)
Immature Granulocytes: 1 %
Lymphocytes Relative: 10 %
Lymphs Abs: 1.2 10*3/uL (ref 0.7–4.0)
MCH: 31.7 pg (ref 26.0–34.0)
MCHC: 32.4 g/dL (ref 30.0–36.0)
MCV: 97.9 fL (ref 80.0–100.0)
Monocytes Absolute: 0.5 10*3/uL (ref 0.1–1.0)
Monocytes Relative: 4 %
Neutro Abs: 10 10*3/uL — ABNORMAL HIGH (ref 1.7–7.7)
Neutrophils Relative %: 84 %
Platelets: 191 10*3/uL (ref 150–400)
RBC: 4.35 MIL/uL (ref 4.22–5.81)
RDW: 13.2 % (ref 11.5–15.5)
WBC: 11.8 10*3/uL — ABNORMAL HIGH (ref 4.0–10.5)
nRBC: 0 % (ref 0.0–0.2)

## 2019-06-20 LAB — MAGNESIUM
Magnesium: 1.8 mg/dL (ref 1.7–2.4)
Magnesium: 1.6 mg/dL — ABNORMAL LOW (ref 1.7–2.4)

## 2019-06-20 LAB — PROTIME-INR
INR: 1 (ref 0.8–1.2)
Prothrombin Time: 13.2 seconds (ref 11.4–15.2)

## 2019-06-20 LAB — TROPONIN I (HIGH SENSITIVITY)
Troponin I (High Sensitivity): 26 ng/L — ABNORMAL HIGH (ref <18)
Troponin I (High Sensitivity): 30 ng/L — ABNORMAL HIGH (ref <18)

## 2019-06-20 LAB — LACTIC ACID, PLASMA: Lactic Acid, Venous: 1.4 mmol/L (ref 0.5–1.9)

## 2019-06-20 LAB — PHOSPHORUS: Phosphorus: 2 mg/dL — ABNORMAL LOW (ref 2.5–4.6)

## 2019-06-20 MED ORDER — FOLIC ACID 1 MG PO TABS
1.0000 mg | ORAL_TABLET | Freq: Every day | ORAL | Status: DC
  Administered 2019-06-20 – 2019-06-22 (×2): 1 mg via ORAL
  Filled 2019-06-20 (×4): qty 1

## 2019-06-20 MED ORDER — ACETAMINOPHEN 325 MG PO TABS
650.0000 mg | ORAL_TABLET | ORAL | Status: DC | PRN
  Administered 2019-06-21: 650 mg via ORAL
  Filled 2019-06-20 (×3): qty 2

## 2019-06-20 MED ORDER — THIAMINE HCL 100 MG PO TABS
100.0000 mg | ORAL_TABLET | Freq: Every day | ORAL | Status: DC
  Administered 2019-06-20 – 2019-06-26 (×3): 100 mg via ORAL
  Filled 2019-06-20 (×5): qty 1

## 2019-06-20 MED ORDER — ADULT MULTIVITAMIN W/MINERALS CH
1.0000 | ORAL_TABLET | Freq: Every day | ORAL | Status: DC
  Administered 2019-06-20 – 2019-06-22 (×2): 1 via ORAL
  Filled 2019-06-20 (×4): qty 1

## 2019-06-20 MED ORDER — METOPROLOL TARTRATE 25 MG PO TABS
25.0000 mg | ORAL_TABLET | Freq: Two times a day (BID) | ORAL | Status: DC
  Administered 2019-06-20: 25 mg via ORAL
  Filled 2019-06-20 (×3): qty 1

## 2019-06-20 MED ORDER — ONDANSETRON HCL 4 MG/2ML IJ SOLN: 4.0000 mg | Freq: Four times a day (QID) | INTRAMUSCULAR | Status: DC | PRN

## 2019-06-20 MED ORDER — DOCUSATE SODIUM 100 MG PO CAPS
100.0000 mg | ORAL_CAPSULE | Freq: Two times a day (BID) | ORAL | Status: DC
  Administered 2019-06-20 – 2019-06-22 (×3): 100 mg via ORAL
  Filled 2019-06-20 (×6): qty 1

## 2019-06-20 MED ORDER — SODIUM CHLORIDE 0.9 % IV BOLUS
1000.0000 mL | Freq: Once | INTRAVENOUS | Status: AC
  Administered 2019-06-20: 1000 mL via INTRAVENOUS

## 2019-06-20 MED ORDER — LORAZEPAM 1 MG PO TABS: 1.0000 mg | ORAL_TABLET | ORAL | Status: AC | PRN

## 2019-06-20 MED ORDER — OXYCODONE HCL 5 MG PO TABS
5.0000 mg | ORAL_TABLET | ORAL | Status: DC | PRN
  Administered 2019-06-20: 5 mg via ORAL
  Filled 2019-06-20: qty 1

## 2019-06-20 MED ORDER — ROSUVASTATIN CALCIUM 5 MG PO TABS
10.0000 mg | ORAL_TABLET | Freq: Every day | ORAL | Status: DC
  Administered 2019-06-20 – 2019-06-22 (×2): 10 mg via ORAL
  Filled 2019-06-20 (×4): qty 2

## 2019-06-20 MED ORDER — ENOXAPARIN SODIUM 40 MG/0.4ML ~~LOC~~ SOLN
40.0000 mg | SUBCUTANEOUS | Status: DC
  Administered 2019-06-20 – 2019-06-22 (×3): 40 mg via SUBCUTANEOUS
  Filled 2019-06-20 (×3): qty 0.4

## 2019-06-20 MED ORDER — ONDANSETRON 4 MG PO TBDP: 4.0000 mg | ORAL_TABLET | Freq: Four times a day (QID) | ORAL | Status: DC | PRN

## 2019-06-20 MED ORDER — METOPROLOL TARTRATE 5 MG/5ML IV SOLN
5.0000 mg | Freq: Four times a day (QID) | INTRAVENOUS | Status: DC | PRN
  Administered 2019-06-22: 5 mg via INTRAVENOUS
  Filled 2019-06-20: qty 5

## 2019-06-20 MED ORDER — SODIUM CHLORIDE 0.9 % IV SOLN: INTRAVENOUS | Status: DC

## 2019-06-20 MED ORDER — LORAZEPAM 2 MG/ML IJ SOLN
1.0000 mg | INTRAMUSCULAR | Status: AC | PRN
  Administered 2019-06-21: 2 mg via INTRAVENOUS
  Administered 2019-06-21 – 2019-06-22 (×2): 1 mg via INTRAVENOUS
  Administered 2019-06-22 (×2): 2 mg via INTRAVENOUS
  Administered 2019-06-22: 1 mg via INTRAVENOUS
  Administered 2019-06-23: 2 mg via INTRAVENOUS
  Filled 2019-06-20 (×9): qty 1

## 2019-06-20 MED ORDER — THIAMINE HCL 100 MG/ML IJ SOLN
100.0000 mg | Freq: Every day | INTRAMUSCULAR | Status: DC
  Administered 2019-06-21 – 2019-06-28 (×6): 100 mg via INTRAVENOUS
  Filled 2019-06-20 (×6): qty 2

## 2019-06-20 MED ORDER — MORPHINE SULFATE (PF) 2 MG/ML IV SOLN
2.0000 mg | INTRAVENOUS | Status: DC | PRN
  Administered 2019-06-20 – 2019-06-22 (×5): 2 mg via INTRAVENOUS
  Filled 2019-06-20 (×5): qty 1

## 2019-06-20 NOTE — ED Notes (Addendum)
Pt found to be attempting to get up out of bed. This RN and Barista redirected pt back to bed, placed pt back on monitoring equipment & educated pt on need to stay in bed. Pt voiced understanding & repeated back instructions to stay in bed. Pt's daughter at bedside, tearful & anxious. Pt's daughter consoled & advised that pt is not to get up. Pt's daughter voiced understanding.

## 2019-06-20 NOTE — ED Notes (Signed)
Noted blood oozing from bandage and pooling in bed with clots. Chest tube site reassessed by EDP, combat gauze placed per orders and new dressing applied over combat gauze.

## 2019-06-20 NOTE — ED Notes (Signed)
ED Provider at bedside. 

## 2019-06-20 NOTE — ED Notes (Signed)
This RN attempted to call report to 4N, nurse was unavailable. This RN gave callback number & got number for 4N RN, will call back in 67min if call is not returned.

## 2019-06-20 NOTE — Consult Note (Signed)
Cardiology Consultation:   Patient ID: Benjamin Carlson MRN: [id number]; DOB: 1945/10/02  Admit date: 07/14/2019 Date of Consult: 06/21/2019  Primary Care Provider: Patient, No Pcp Per [facility] Cardiologist: New  [facility] Electrophysiologist:  None    Patient Profile:   Benjamin Carlson is a 74 y.o. male with a hx of tobacco use, OA,  hyperlipidemia, alcohol use, recent PTX that left AMA 06/19/19 from [facility] who is being seen today for the evaluation of A. fib RVR at the request of Trauma team.  History of Present Illness:   [name] has not been seen by cardiology in the past. No known history of MI, stent, heart failure, stroke, arrhythmia, HTN. Denies family history of heart disease. Drinks 4-5 cups of coffee daily. No alcohol or drug use. Quit smoking 2 weeks ago. Retired but still very active. No chest pain or pressure with exertion.   The patient was admitted 6/2 found to have 50% right sided PTX. He had a chest tube placed by general surgery. The next day he felt better and pulled out the chest and  left AMA 06/19/19.Overnight he became increasingly sob but no chest pain. EMS was called and they reported O2 sats in the 80s and placed on 4L O2. No nausea, vomiting, abd pain.   In the ED BP 114/70, pulse 104, afebrile, respiratory rate 25. EKG showed new onset Afib RVR 125 bpm, with some ST depression lateral leads. Labs showed potassium 3.5, glucose 148, creatinine 1.03, Mag 1.8, albumin 3.4, WBC 11.8, Hgb 13.8. Hs troponin 26>30. CXR showed 100% PTX and surgery inserted chest tube and admitted patient.   It appears patient has since converted from afib RVR to sinus tach with rates up to 120.    Past Medical History:  Diagnosis Date  . Arthritis   . Elevated cholesterol 05/17/2019   just diagnosed last month, on med for one moth    Past Surgical History:  Procedure Laterality Date  . COLONOSCOPY N/A 06/06/2012   YBO:FBPZWCH diverticulosis. Colonic polyps, tubular adenoma,  hyperplastic.Next TCS 05/2017.  Marland Kitchen ESOPHAGOGASTRODUODENOSCOPY N/A 06/16/2013   Dr.Rourk- normal esophagus. pt has mottling of the gastric mucosa w/antral erosions. no gastric ulcer or infiltrating process. pylorus patent and easily traversed examination bulb & second portion revealed 24mm areas of bulbar ulceration. there was surrounding erosions as well. bx= reactive gastropathy with slight chronic inflammation.  Marland Kitchen SHOULDER SURGERY    . TONSILLECTOMY     age 59     Home Medications:  Prior to Admission medications   Medication Sig Start Date End Date Taking? Authorizing Provider  Omega-3 Fatty Acids (FISH OIL) 500 MG CAPS Take 1 capsule by mouth daily.   Yes [provider]  rosuvastatin (CRESTOR) 10 MG tablet Take 10 mg by mouth daily. 06/16/19  Yes [provider]  acetaminophen (TYLENOL) 500 MG tablet Take 1,000 mg by mouth every 6 (six) hours as needed for pain.    [provider]    Inpatient Medications: Scheduled Meds: . docusate sodium  100 mg Oral BID  . enoxaparin (LOVENOX) injection  40 mg Subcutaneous Q24H  . folic acid  1 mg Oral Daily  . multivitamin with minerals  1 tablet Oral Daily  . rosuvastatin  10 mg Oral Daily  . thiamine  100 mg Oral Daily   Or  . thiamine  100 mg Intravenous Daily   Continuous Infusions: . sodium chloride     PRN Meds: acetaminophen, LORazepam **OR** LORazepam, metoprolol tartrate,  morphine injection, ondansetron **OR** ondansetron (ZOFRAN) IV, oxyCODONE  Allergies:   No Known Allergies  Social History:   Social History   Socioeconomic History  . Marital status: Divorced    Spouse name: Not on file  . Number of children: Not on file  . Years of education: Not on file  . Highest education level: Not on file  Occupational History  . Not on file  Tobacco Use  . Smoking status: Current Every Day Smoker    Packs/day: 1.00    Types: Cigarettes  Substance and Sexual Activity  . Alcohol use: Yes    Comment:  occasionally  . Drug use: No  . Sexual activity: Not on file  Other Topics Concern  . Not on file  Social History Narrative  . Not on file   Social Determinants of Health   Financial Resource Strain:   . Difficulty of Paying Living Expenses:   Food Insecurity:   . Worried About Charity fundraiser in the Last Year:   . Arboriculturist in the Last Year:   Transportation Needs:   . Film/video editor (Medical):   Marland Kitchen Lack of Transportation (Non-Medical):   Physical Activity:   . Days of Exercise per Week:   . Minutes of Exercise per Session:   Stress:   . Feeling of Stress :   Social Connections:   . Frequency of Communication with Friends and Family:   . Frequency of Social Gatherings with Friends and Family:   . Attends Religious Services:   . Active Member of Clubs or Organizations:   . Attends Archivist Meetings:   Marland Kitchen Marital Status:   Intimate Partner Violence:   . Fear of Current or Ex-Partner:   . Emotionally Abused:   Marland Kitchen Physically Abused:   . Sexually Abused:     Family History:   Family History  Problem Relation Age of Onset  . Breast cancer Mother   . Cancer - Other Father   . Lung cancer Sister   . Lung cancer Brother   . Cancer - Other Brother      ROS:  Please see the history of present illness.  All other ROS reviewed and negative.     Physical Exam/Data:   Vitals:   07/09/2019 1400 06/25/2019 1415 06/30/2019 1430 07/03/2019 1500  BP: (!) 124/52 132/89 (!) 150/115 (!) 150/106  Pulse: 95 (!) 152 (!) 163 (!) 125  Resp: (!) 29 (!) 28 (!) 26 (!) 33  Temp:      TempSrc:      SpO2: 98% 99% 97% 92%    Intake/Output Summary (Last 24 hours) at 06/19/2019 1538 Last data filed at 06/23/2019 1414 Gross per 24 hour  Intake 1000 ml  Output --  Net 1000 ml   Last 3 Weights 06/18/2019 06/18/2019 02/17/2014  Weight (lbs) 175 lb 0.7 oz 170 lb 202 lb 1.6 oz  Weight (kg) 79.4 kg 77.111 kg 91.672 kg     There is no height or weight on file to calculate BMI.   General:  Well nourished, well developed, in no acute distress HEENT: normal Lymph: no adenopathy Neck: no JVD Endocrine:  No thryomegaly Vascular: No carotid bruits; FA pulses 2+ bilaterally without bruits  Cardiac:  normal S1, S2; RRR; no murmur  Lungs: diminished Rt side Abd: soft, nontender, no hepatomegaly  Ext: no edema Musculoskeletal:  No deformities, BUE and BLE strength normal and equal Skin: warm and dry  Neuro:  CNs  2-12 intact, no focal abnormalities noted Psych:  Normal affect   EKG:  The EKG was personally reviewed and demonstrates:  afib RVR, 127 bpm ST depression inferolateral leads, TWI V2 Telemetry:  Telemetry was personally reviewed and demonstrates:  Afib with rates up to 150, now in sinus tach  Relevant CV Studies:  echo  Laboratory Data:  High Sensitivity Troponin:   Recent Labs  Lab 07/05/2019 1144 07/04/2019 1415  TROPONINIHS 26* 30*     Chemistry Recent Labs  Lab 06/18/19 0900 06/19/19 0636 07/02/2019 1144  NA 138 136 137  K 4.0 3.5 3.5  CL 100 100 99  CO2 26 27 24   GLUCOSE 158* 164* 148*  BUN 11 12 18   CREATININE 0.82 0.75 1.03  CALCIUM 9.0 8.8* 9.0  GFRNONAA >60 >60 >60  GFRAA >60 >60 >60  ANIONGAP 12 9 14     Recent Labs  Lab 06/18/19 0900 07/14/2019 1144  PROT 7.7 6.8  ALBUMIN 4.1 3.4*  AST 21 20  ALT 14 15  ALKPHOS 81 68  BILITOT 0.7 1.0   Hematology Recent Labs  Lab 06/18/19 0900 06/19/19 0636 07/02/2019 1144  WBC 11.9* 10.4 11.8*  RBC 4.42 4.43 4.35  HGB 14.0 14.0 13.8  HCT 42.7 42.3 42.6  MCV 96.6 95.5 97.9  MCH 31.7 31.6 31.7  MCHC 32.8 33.1 32.4  RDW 13.2 13.2 13.2  PLT 223 196 191   BNPNo results for input(s): BNP, PROBNP in the last 168 hours.  DDimer No results for input(s): DDIMER in the last 168 hours.   Radiology/Studies:  DG Chest 1 View  Result Date: 06/19/2019 CLINICAL DATA:  Pneumothorax. EXAM: CHEST  1 VIEW 4:21 p.m. COMPARISON:  06/19/2019 at 7:49 a.m. and chest CT dated 06/19/2019 at 12:17 p.m.  FINDINGS: The right-sided chest tube is been removed. Extensive subcutaneous emphysema is unchanged. Heart size and vascularity are normal. Mediastinal emphysema is also unchanged. There is a tiny peripheral right pneumothorax which is unchanged since the prior CT scan. No infiltrates or effusions.  No acute bone abnormality. IMPRESSION: 1. No change in the tiny peripheral right pneumothorax after chest tube removal. 2. No change in the extensive subcutaneous and mediastinal emphysema. Electronically Signed   By: Lorriane Shire M.D.   On: 06/19/2019 16:52   DG Chest 2 View  Result Date: 06/19/2019 CLINICAL DATA:  Pneumothorax follow-up EXAM: CHEST - 2 VIEW COMPARISON:  Yesterday FINDINGS: Right-sided chest tube in similar position. New pneumomediastinum and increased right pneumothorax, now seen at the base. Extensive chest wall emphysema. A left apical lucency may be extrapleural gas given constellation of findings, or trace pneumothorax. Normal heart size. IMPRESSION: 1. Increased right pneumothorax, now also seen at the base. 2. New pneumomediastinum and extensive chest wall emphysema. 3. Lucency at the left apex could be extrapleural or pneumothorax, consider short follow-up. Electronically Signed   By: Monte Fantasia M.D.   On: 06/19/2019 10:29   DG Ribs Unilateral Right  Result Date: 06/19/2019 CLINICAL DATA:  Chest tube EXAM: RIGHT RIBS - 2 VIEW COMPARISON:  Chest x-ray from the same time in yesterday FINDINGS: Known right pneumothorax and pneumomediastinum. A right chest tube is in place. No visible rib fracture or erosion, but limited by the degree of soft tissue emphysema. IMPRESSION: Negative right rib series.  Referenced contemporaneous chest x-ray. Electronically Signed   By: Monte Fantasia M.D.   On: 06/19/2019 10:36   CT CHEST WO CONTRAST  Result Date: 06/19/2019 CLINICAL DATA:  Pneumothorax, chest tube placement,  subsequent chest wall emphysema and pneumomediastinum EXAM: CT CHEST WITHOUT  CONTRAST TECHNIQUE: Multidetector CT imaging of the chest was performed following the standard protocol without IV contrast. Sagittal and coronal MPR images reconstructed from axial data set. 06/19/2019 COMPARISON:  Chest radiograph FINDINGS: Cardiovascular: Atherosclerotic calcifications aorta and coronary arteries. Aorta normal caliber. Heart unremarkable. Mediastinum/Nodes: Extensive mediastinal pneumatosis. Extensive chest wall pneumatosis RIGHT greater than LEFT. Extension of chest wall and mediastinal pneumatosis into the inferior cervical regions bilaterally. Base of cervical region otherwise normal appearance. Esophagus unremarkable. Lungs/Pleura: Pigtail RIGHT thoracostomy tube at the anterior mid chest. Persistent small RIGHT pneumothorax. Scattered subpleural blebs greater on RIGHT. Minimal central peribronchial thickening. Subpleural pneumatosis bilaterally. Subpleural pneumatosis LEFT apex, without definite LEFT apex pneumothorax. No endobronchial lesions. Small focus of ground-glass infiltrate in anterior LEFT upper lobe/lingula. Mild compressive atelectasis RIGHT lower lobe. No significant pleural effusion or pulmonary mass. Upper Abdomen: Visualized upper abdomen unremarkable Musculoskeletal: No acute osseous findings. IMPRESSION: Persistent small RIGHT pneumothorax despite pigtail thoracostomy tube. Extensive chest wall, mediastinal, and cervical pneumatosis, greater on RIGHT. Scattered subpleural blebs especially at apices with small focus of ground-glass infiltrate in anterior LEFT upper lobe/lingula. Atherosclerotic calcifications including coronary arteries. Aortic Atherosclerosis (ICD10-I70.0). Electronically Signed   By: Lavonia Dana M.D.   On: 06/19/2019 12:43   DG Chest Portable 1 View  Result Date: 06/28/2019 CLINICAL DATA:  Recent right pneumothorax. Patient left AMA after pulling tube. New onset shortness of breath. EXAM: PORTABLE CHEST 1 VIEW COMPARISON:  06/19/2019.  CT 06/19/2019.  FINDINGS: Near complete right pneumothorax is noted. Near complete atelectasis of the right lung. Diffuse interstitial prominence left lung. Heart size stable. Diffuse bilateral chest wall subcutaneous emphysema is again noted. IMPRESSION: Near complete right pneumothorax with atelectasis of the right lung. Critical Value/emergent results were called by telephone at the time of interpretation on 07/11/2019 at 12:39 pm to provider [facility] , who verbally acknowledged these results. Electronically Signed   By: Marcello Moores  Register   On: 06/19/2019 12:42   DG Chest Port 1 View  Result Date: 06/18/2019 CLINICAL DATA:  Right-sided chest tube placement EXAM: PORTABLE CHEST 1 VIEW COMPARISON:  06/18/2019, 8:09 a.m. FINDINGS: Interval placement of right chest pigtail chest tube, with substantial resolution of previously seen moderate right pneumothorax, persistent pneumothorax approximately 10%. The left lung is normally aerated. The heart and mediastinum are unremarkable. IMPRESSION: Interval placement of right chest pigtail chest tube, with substantial resolution of previously seen moderate right pneumothorax, persistent pneumothorax approximately 10%. Electronically Signed   By: Eddie Candle M.D.   On: 06/18/2019 10:57   DG Chest Port 1 View  Result Date: 06/18/2019 CLINICAL DATA:  Shortness of breath status post fall EXAM: PORTABLE CHEST 1 VIEW COMPARISON:  02/17/2014 FINDINGS: Large right pneumothorax measuring at least 50%. No focal consolidation and pleural effusion. No left pneumothorax. Stable cardiomediastinal silhouette. No aggressive osseous lesion. IMPRESSION: Large right pneumothorax measuring at least 50%. Critical Value/emergent results were called by telephone at the time of interpretation on 06/18/2019 at 8:42 am to provider [facility] ZAMMIT , who verbally acknowledged these results. Electronically Signed   By: Kathreen Devoid   On: 06/18/2019 08:43    Assessment and Plan:   New onset afib RVR -  In the setting of worsening R PTX - Rates elevated to 150s - HS troponin mildly elevated 26>30 - check TSH - K+>4 and Mag >2 - CHADSVASC = 1 (age). Will discuss a/c with MD. Also suspect pt has HTN - check echo -  Denies chest pain  - Now in sinus tach. Start BB for rate control  Rt PTX - CXR with almost 100% PTX s/p CT - per surgery  HLD - Rosuvastatin - check lipids  ETOH - CIWA in care  Tobacco use - prn nebs  For questions or updates, please contact [place] Please consult [email or web address] for contact info under    Signed, Cadence Ninfa Meeker, PA-C  07/15/2019 3:38 PM   History and all data above reviewed.  Patient examined.  I agree with the findings as above.   The patient presented with spontaneous pneumothorax.  He is noted to be in atrial fibrillation with rapid rate.  He has no past cardiac history.  We are consulted to manage atrial fibrillation.  He has converted back to sinus tachycardia.  He does not report any palpitations in the past.  He is very physically active.  He denies any chest pressure, neck or arm discomfort.  Is not had any presyncope or syncope.  He was on the EKG in 6 to in sinus rhythm.  I see an EKG from 6 3 at 1254 it was fibrillation but not a rapid rate.  Exam reveals [id number]   ,  Lungs: Decreased breath sounds, positive crepitance on palpation of the chest,  Abd: Positive bowel sounds normal in frequency in pitch,, Ext 2+ pulses, no edema..  All available labs, radiology testing, previous records reviewed. Agree with documented assessment and plan.   Atrial fib: The patient has low thromboembolic risk.  He is now in sinus rhythm so this is obviously paroxysmal.  It is likely initiated from the discomfort associated with his pneumothorax and shortness of breath.  At this point I would check an echocardiogram.  He can start a beta-blocker.  There is no indication for anticoagulation.  Mr. YEHYA BRENDLE has a CHA2DS2 - VASc score of 1.    Jeneen Rinks  Marely Apgar  4:58 PM  07/10/2019

## 2019-06-20 NOTE — Procedures (Signed)
   Procedure Note  Date: 07/10/2019  Procedure: tube thoracostomy--right    Pre-op diagnosis: right pneumothorax  Post-op diagnosis: same  Surgeon: Jesusita Oka, MD  Anesthesia: local   EBL: <5cc procedural Drains/Implants: 24F chest tube Specimen: none  Description of procedure: Time-out was performed verifying correct patient, procedure, site, laterality, and signature of informed consent. Five cc's of local anesthetic was infiltrated into the tissues just over the fourth intercostal space.  A small skin nick was made at the fourth intercostal space. An introducer needle was inserted and a guidewire inserted through the needle. The needle was removed and the tract dilated.  An audible air rush was encountered upon entry. The chest tube was inserted over the guidewire and the guidewire removed.   The tube was secured at the skin with suture and connected to an atrium at -20cm water wall suction. Immediate output from the chest tube was 0cc. The site was dressed with gauze and tape. The patient tolerated the procedure well. There were no complications. Follow up chest x-ray was ordered to confirm tube positioning, complete evacuation, and complete lung re-expansion.    Jesusita Oka, MD General and [facility]

## 2019-06-20 NOTE — ED Notes (Signed)
Trauma PA paged to [phone] Dr.Tegeler paged by Levada Dy

## 2019-06-20 NOTE — ED Provider Notes (Signed)
[facility] EMERGENCY DEPARTMENT Provider Note   CSN: [id number] Arrival date & time: 06/21/2019  1127     History No chief complaint on file. Shortness of breath   Benjamin Carlson is a 74 y.o. male.  The history is provided by the patient and medical records. No language interpreter was used.  Shortness of Breath Severity:  Severe Onset quality:  Gradual Duration:  2 days Timing:  Constant Progression:  Worsening Chronicity:  Recurrent Relieved by:  Nothing Worsened by:  Nothing Ineffective treatments:  None tried Associated symptoms: cough (chronic)   Associated symptoms: no abdominal pain, no chest pain, no diaphoresis, no fever, no headaches, no hemoptysis, no neck pain, no sputum production, no vomiting and no wheezing        Past Medical History:  Diagnosis Date   Arthritis    Elevated cholesterol 05/17/2019   just diagnosed last month, on med for one moth    Patient Active Problem List   Diagnosis Date Noted   Subcutaneous emphysema ([place])    Pneumothorax 06/18/2019   Pneumothorax on right    Abnormal CT scan, stomach 06/03/2013   Hx of adenomatous colonic polyps 06/03/2013    Past Surgical History:  Procedure Laterality Date   COLONOSCOPY N/A 06/06/2012   [id number] diverticulosis. Colonic polyps, tubular adenoma, hyperplastic.Next TCS 05/2017.   ESOPHAGOGASTRODUODENOSCOPY N/A 06/16/2013   Dr.Rourk- normal esophagus. pt has mottling of the gastric mucosa w/antral erosions. no gastric ulcer or infiltrating process. pylorus patent and easily traversed examination bulb & second portion revealed 58mm areas of bulbar ulceration. there was surrounding erosions as well. bx= reactive gastropathy with slight chronic inflammation.   SHOULDER SURGERY     TONSILLECTOMY     age 25       Family History  Problem Relation Age of Onset   Breast cancer Mother    Cancer - Other Father    Lung cancer Sister    Lung cancer Brother     Cancer - Other Brother     Social History   Tobacco Use   Smoking status: Current Every Day Smoker    Packs/day: 1.00    Types: Cigarettes  Substance Use Topics   Alcohol use: Yes    Comment: occasionally   Drug use: No    Home Medications Prior to Admission medications   Medication Sig Start Date End Date Taking? Authorizing Provider  acetaminophen (TYLENOL) 500 MG tablet Take 1,000 mg by mouth every 6 (six) hours as needed for pain.    [provider]  Omega-3 Fatty Acids (FISH OIL) 500 MG CAPS Take 1 capsule by mouth daily.    [provider]  rosuvastatin (CRESTOR) 10 MG tablet Take 10 mg by mouth daily. 06/16/19   [provider]    Allergies    Patient has no known allergies.  Review of Systems   Review of Systems  Constitutional: Negative for chills, diaphoresis, fatigue and fever.  HENT: Negative for congestion.   Respiratory: Positive for cough (chronic) and shortness of breath. Negative for hemoptysis, sputum production, chest tightness, wheezing and stridor.   Cardiovascular: Negative for chest pain, palpitations and leg swelling.  Gastrointestinal: Negative for abdominal pain, constipation, diarrhea, nausea and vomiting.  Genitourinary: Negative for flank pain.  Musculoskeletal: Negative for back pain, neck pain and neck stiffness.  Neurological: Negative for headaches.  Psychiatric/Behavioral: Negative for agitation.  All other systems reviewed and are negative.   Physical Exam Updated Vital Signs BP 114/75 (BP Location:  Right Arm)    Pulse (!) 104    Temp 98.1 F (36.7 C) (Oral)    Resp (!) 25    SpO2 94%   Physical Exam Vitals and nursing note reviewed.  Constitutional:      General: He is not in acute distress.    Appearance: He is well-developed. He is not ill-appearing, toxic-appearing or diaphoretic.  HENT:     Head: Normocephalic and atraumatic.     Mouth/Throat:     Mouth: Mucous membranes are dry.     Pharynx: No  oropharyngeal exudate or posterior oropharyngeal erythema.  Eyes:     Conjunctiva/sclera: Conjunctivae normal.     Pupils: Pupils are equal, round, and reactive to light.  Cardiovascular:     Rate and Rhythm: Normal rate and regular rhythm.     Heart sounds: No murmur.  Pulmonary:     Effort: Tachypnea present. No respiratory distress.     Breath sounds: Examination of the right-upper field reveals decreased breath sounds. Examination of the left-upper field reveals rhonchi. Examination of the right-middle field reveals decreased breath sounds. Examination of the left-middle field reveals rhonchi. Examination of the right-lower field reveals decreased breath sounds. Examination of the left-lower field reveals rhonchi. Decreased breath sounds and rhonchi present. No wheezing or rales.    Chest:     Chest wall: No tenderness.  Abdominal:     General: There is no distension.     Palpations: Abdomen is soft.     Tenderness: There is no abdominal tenderness. There is no right CVA tenderness, left CVA tenderness, guarding or rebound.  Musculoskeletal:        General: No tenderness.     Cervical back: Neck supple.  Skin:    General: Skin is warm and dry.     Findings: No erythema.  Neurological:     General: No focal deficit present.     Mental Status: He is alert.  Psychiatric:        Mood and Affect: Mood normal.     ED Results / Procedures / Treatments   Labs (all labs ordered are listed, but only abnormal results are displayed) Labs Reviewed  CBC WITH DIFFERENTIAL/PLATELET - Abnormal; Notable for the following components:      Result Value   WBC 11.8 (*)    Neutro Abs 10.0 (*)    All other components within normal limits  COMPREHENSIVE METABOLIC PANEL - Abnormal; Notable for the following components:   Glucose, Bld 148 (*)    Albumin 3.4 (*)    All other components within normal limits  TROPONIN I (HIGH SENSITIVITY) - Abnormal; Notable for the following components:    Troponin I (High Sensitivity) 26 (*)    All other components within normal limits  TROPONIN I (HIGH SENSITIVITY) - Abnormal; Notable for the following components:   Troponin I (High Sensitivity) 30 (*)    All other components within normal limits  MAGNESIUM  PROTIME-INR  LACTIC ACID, PLASMA  COMPREHENSIVE METABOLIC PANEL  MAGNESIUM  PHOSPHORUS  CBC  TSH    EKG EKG Interpretation  Date/Time:  Friday June 20 2019 11:36:17 EDT Ventricular Rate:  125 PR Interval:    QRS Duration: 105 QT Interval:  332 QTC Calculation: 479 R Axis:   79 Text Interpretation: Atrial fibrillation Ventricular premature complex RSR' in V1 or V2, right VCD or RVH Probable left ventricular hypertrophy Repol abnrm suggests ischemia, diffuse leads When compared to prior, now rvr compared to afib yesterday. No STEMI  Confirmed by Antony Blackbird [phone]) on 06/21/2019 11:52:39 AM   Radiology DG Chest 1 View  Result Date: 06/19/2019 CLINICAL DATA:  Pneumothorax. EXAM: CHEST  1 VIEW 4:21 p.m. COMPARISON:  06/19/2019 at 7:49 a.m. and chest CT dated 06/19/2019 at 12:17 p.m. FINDINGS: The right-sided chest tube is been removed. Extensive subcutaneous emphysema is unchanged. Heart size and vascularity are normal. Mediastinal emphysema is also unchanged. There is a tiny peripheral right pneumothorax which is unchanged since the prior CT scan. No infiltrates or effusions.  No acute bone abnormality. IMPRESSION: 1. No change in the tiny peripheral right pneumothorax after chest tube removal. 2. No change in the extensive subcutaneous and mediastinal emphysema. Electronically Signed   By: Lorriane Shire M.D.   On: 06/19/2019 16:52   DG Chest 2 View  Result Date: 06/19/2019 CLINICAL DATA:  Pneumothorax follow-up EXAM: CHEST - 2 VIEW COMPARISON:  Yesterday FINDINGS: Right-sided chest tube in similar position. New pneumomediastinum and increased right pneumothorax, now seen at the base. Extensive chest wall emphysema. A left apical  lucency may be extrapleural gas given constellation of findings, or trace pneumothorax. Normal heart size. IMPRESSION: 1. Increased right pneumothorax, now also seen at the base. 2. New pneumomediastinum and extensive chest wall emphysema. 3. Lucency at the left apex could be extrapleural or pneumothorax, consider short follow-up. Electronically Signed   By: Monte Fantasia M.D.   On: 06/19/2019 10:29   DG Ribs Unilateral Right  Result Date: 06/19/2019 CLINICAL DATA:  Chest tube EXAM: RIGHT RIBS - 2 VIEW COMPARISON:  Chest x-ray from the same time in yesterday FINDINGS: Known right pneumothorax and pneumomediastinum. A right chest tube is in place. No visible rib fracture or erosion, but limited by the degree of soft tissue emphysema. IMPRESSION: Negative right rib series.  Referenced contemporaneous chest x-ray. Electronically Signed   By: Monte Fantasia M.D.   On: 06/19/2019 10:36   CT CHEST WO CONTRAST  Result Date: 06/19/2019 CLINICAL DATA:  Pneumothorax, chest tube placement, subsequent chest wall emphysema and pneumomediastinum EXAM: CT CHEST WITHOUT CONTRAST TECHNIQUE: Multidetector CT imaging of the chest was performed following the standard protocol without IV contrast. Sagittal and coronal MPR images reconstructed from axial data set. 06/19/2019 COMPARISON:  Chest radiograph FINDINGS: Cardiovascular: Atherosclerotic calcifications aorta and coronary arteries. Aorta normal caliber. Heart unremarkable. Mediastinum/Nodes: Extensive mediastinal pneumatosis. Extensive chest wall pneumatosis RIGHT greater than LEFT. Extension of chest wall and mediastinal pneumatosis into the inferior cervical regions bilaterally. Base of cervical region otherwise normal appearance. Esophagus unremarkable. Lungs/Pleura: Pigtail RIGHT thoracostomy tube at the anterior mid chest. Persistent small RIGHT pneumothorax. Scattered subpleural blebs greater on RIGHT. Minimal central peribronchial thickening. Subpleural pneumatosis  bilaterally. Subpleural pneumatosis LEFT apex, without definite LEFT apex pneumothorax. No endobronchial lesions. Small focus of ground-glass infiltrate in anterior LEFT upper lobe/lingula. Mild compressive atelectasis RIGHT lower lobe. No significant pleural effusion or pulmonary mass. Upper Abdomen: Visualized upper abdomen unremarkable Musculoskeletal: No acute osseous findings. IMPRESSION: Persistent small RIGHT pneumothorax despite pigtail thoracostomy tube. Extensive chest wall, mediastinal, and cervical pneumatosis, greater on RIGHT. Scattered subpleural blebs especially at apices with small focus of ground-glass infiltrate in anterior LEFT upper lobe/lingula. Atherosclerotic calcifications including coronary arteries. Aortic Atherosclerosis (ICD10-I70.0). Electronically Signed   By: Lavonia Dana M.D.   On: 06/19/2019 12:43   DG Chest Portable 1 View  Result Date: 07/15/2019 CLINICAL DATA:  Recent right pneumothorax. Patient left AMA after pulling tube. New onset shortness of breath. EXAM: PORTABLE CHEST 1 VIEW COMPARISON:  06/19/2019.  CT 06/19/2019. FINDINGS: Near complete right pneumothorax is noted. Near complete atelectasis of the right lung. Diffuse interstitial prominence left lung. Heart size stable. Diffuse bilateral chest wall subcutaneous emphysema is again noted. IMPRESSION: Near complete right pneumothorax with atelectasis of the right lung. Critical Value/emergent results were called by telephone at the time of interpretation on 06/26/2019 at 12:39 pm to provider [facility] , who verbally acknowledged these results. Electronically Signed   By: Marcello Moores  Register   On: 06/24/2019 12:42    Procedures Procedures (including critical care time)  Medications Ordered in ED Medications  rosuvastatin (CRESTOR) tablet 10 mg (has no administration in time range)  enoxaparin (LOVENOX) injection 40 mg (has no administration in time range)  0.9 %  sodium chloride infusion (has no  administration in time range)  acetaminophen (TYLENOL) tablet 650 mg (has no administration in time range)  oxyCODONE (Oxy IR/ROXICODONE) immediate release tablet 5 mg (has no administration in time range)  morphine 2 MG/ML injection 2 mg (has no administration in time range)  docusate sodium (COLACE) capsule 100 mg (has no administration in time range)  ondansetron (ZOFRAN-ODT) disintegrating tablet 4 mg (has no administration in time range)    Or  ondansetron (ZOFRAN) injection 4 mg (has no administration in time range)  metoprolol tartrate (LOPRESSOR) injection 5 mg (has no administration in time range)  LORazepam (ATIVAN) tablet 1-4 mg (has no administration in time range)    Or  LORazepam (ATIVAN) injection 1-4 mg (has no administration in time range)  thiamine tablet 100 mg (has no administration in time range)    Or  thiamine (B-1) injection 100 mg (has no administration in time range)  folic acid (FOLVITE) tablet 1 mg (has no administration in time range)  multivitamin with minerals tablet 1 tablet (has no administration in time range)  sodium chloride 0.9 % bolus 1,000 mL (0 mLs Intravenous Stopped 06/19/2019 1414)    ED Course  I have reviewed the triage vital signs and the nursing notes.  Pertinent labs & imaging results that were available during my care of the patient were reviewed by me and considered in my medical decision making (see chart for details).    MDM Rules/Calculators/A&P                      Benjamin Carlson is a 74 y.o. male with a past medical history significant for hypercholesterolemia and recent pneumothorax status post leaving AMA yesterday from [facility] who presents with continued shortness of breath.  Patient reports that 2 days ago he had a fall and has was found to have a 50% right-sided pneumothorax.  Patient had a chest tube placed by general surgery and was admitted to the hospital however yesterday, he was feeling better and wanted to leave the  hospital and left AMA after pulling out his chest tube.  He says that overnight he developed worsening shortness of breath but denies any chest pain.  He denies any new fevers or chills but reports a chronic cough that is unchanged.  He says that he decided he needed some help when he could not breathe well.  His oxygen saturations were in the 80s according to EMS and he is now on 4 L nasal cannula to maintain oxygen saturations in the 90s.  He is denying any nausea, vomiting, abdominal pain.  He reports he was tested for Covid 2 days ago and was negative.  He denies any other complaints aside from  the shortness of breath.  He denies any history of A. fib.  He denies palpitations.  On exam, patient was found to have diminished breath sounds on the right compared to the left.  Some rhonchi on the left.  Crepitance was felt in the right chest wall and patient does have the surgical's wound from the recent chest tube.  Chest was nontender and no murmur appreciated.  Abdomen nontender.  Patient moving all extremities.  He is on 4 L in no distress.  He is tachycardic however an EKG in triage show he is now in A. fib with intermittent RVR.  He is afebrile.  We will get repeat chest x-ray as I anticipate he will have continued pneumothorax.  We will get other labs to look for electrolyte imbalance which may have contributed to his new A. fib with RVR.  We will give some fluids to see if his rate improves however if it does not, will likely start diltiazem.  Anticipate reassessment for work-up however anticipate he will require admission for the likely recurrent pneumothorax, hypoxia, and new A. Fib.  Review of the patient's chart shows that he had a CT scan done yesterday showing persistent pneumothorax despite the chest tube with blebs and some infiltrate as well.  We will wait for work-up to be completed but will likely touch base with trauma surgery if repeat chest tube needs to be placed.     Trauma surgery  came down and placed a chest tube.  Is started bleeding and he had combat gauze and pressure dressing placed.  The bleeding improved.  Patient will be moved to trauma for further management of the pneumothorax.  For the new A. fib, trauma requested cardiology consult.  This was placed and cars master was going to send someone to evaluate the patient for further management of A. Fib.  Patient will be admitted to trauma for further management.   Final Clinical Impression(s) / ED Diagnoses Final diagnoses:  Respiratory failure ([place])  Pneumothorax on right     Clinical Impression: 1. Respiratory failure ([place])   2. Pneumothorax on right     Disposition: Admit  This note was prepared with assistance of Dragon voice recognition software. Occasional wrong-word or sound-a-like substitutions may have occurred due to the inherent limitations of voice recognition software.     Sian Rockers, Gwenyth Allegra, MD 06/17/2019 [phone]

## 2019-06-20 NOTE — ED Triage Notes (Signed)
Pt arrives via [facility] from home with complaints of SOB. Pt fell 6/2 and went to [facility]. DX with 50% Right [place]. Ripped out chest tube and left AMA. Pt woke up this am SOB. EMS arrived pt was 86% RA Pt alert and oriented X3

## 2019-06-20 NOTE — ED Notes (Signed)
Pt given 10L O2 via [place] post procedure to place chest tube by provider, sats now 86%. 8L NRB provided, now 97%

## 2019-06-20 NOTE — H&P (Signed)
Benjamin ECONOMOU 29-Oct-1945  [id number].    Chief Complaint/Reason for Consult: right PTX  HPI:  This is a 74 yo white male with a history of HLD who was bending over to pick something up several days ago and felt a pop in his chest.  He states that he doesn't think he fell but is unsure.  He developed SOB and went to [facility] where he was admitted for a 50% PTX.  He had a CT placed by Dr. Constance Haw. He apparently went into a fib yesterday, but pulled his CT out and left AMA.  He returns to [facility] today for worsening SOB.  He denies CP or anything other complaints.  A CXR reveals a 100% PTX.  He has no rib fractures.  He does have blebs noted, likely from undiagnosed COPD given his tobacco abuse history.  We have been called for admission.  ROS: ROS: Please see HPI, otherwise all other systems have been reviewed and are negative.  Family History  Problem Relation Age of Onset  . Breast cancer Mother   . Cancer - Other Father   . Lung cancer Sister   . Lung cancer Brother   . Cancer - Other Brother     Past Medical History:  Diagnosis Date  . Arthritis   . Elevated cholesterol 05/17/2019   just diagnosed last month, on med for one moth    Past Surgical History:  Procedure Laterality Date  . COLONOSCOPY N/A 06/06/2012   [id number] diverticulosis. Colonic polyps, tubular adenoma, hyperplastic.Next TCS 05/2017.  Marland Kitchen ESOPHAGOGASTRODUODENOSCOPY N/A 06/16/2013   Dr.Rourk- normal esophagus. pt has mottling of the gastric mucosa w/antral erosions. no gastric ulcer or infiltrating process. pylorus patent and easily traversed examination bulb & second portion revealed 4mm areas of bulbar ulceration. there was surrounding erosions as well. bx= reactive gastropathy with slight chronic inflammation.  Marland Kitchen SHOULDER SURGERY    . TONSILLECTOMY     age 14    Social History:  reports that he has been smoking cigarettes. He has been smoking about 1.00 pack per day. He does not have any smokeless tobacco history  on file. He reports current alcohol use. He reports that he does not use drugs.  Allergies: No Known Allergies  (Not in a hospital admission)    Physical Exam: Blood pressure 132/89, pulse (!) 152, temperature 98.1 F (36.7 C), temperature source Oral, resp. rate (!) 28, SpO2 99 %. General: pleasant, WD, WN white male who is laying in bed in NAD HEENT: head is normocephalic, atraumatic.  Sclera are noninjected.  PERRL.  Ears and nose without any masses or lesions.  Mouth is pink and moist Heart: irregularly irregular and tachy.  Normal s1,s2. No obvious murmurs, gallops, or rubs noted.  Palpable radial and pedal pulses bilaterally Lungs: CTA on left, but with a faint expiratory wheeze, no BS on right.  Crepitus noted along right chest wall up towards his neck, no rhonchi, or rales noted.  Respiratory effort nonlabored, but sats were in the 80s on 4L Abd: soft, NT, ND, +BS, no masses, hernias, or organomegaly MS: all 4 extremities are symmetrical with no cyanosis, clubbing, or edema. Skin: warm and dry with no masses, lesions, or rashes Neuro: Cranial nerves 2-12 grossly intact, sensation is normal throughout Psych: A&Ox3 with an appropriate affect.   Results for orders placed or performed during the hospital encounter of 07/16/2019 (from the past 48 hour(s))  CBC with Differential     Status: Abnormal  Collection Time: 07/16/2019 11:44 AM  Result Value Ref Range   WBC 11.8 (H) 4.0 - 10.5 K/uL   RBC 4.35 4.22 - 5.81 MIL/uL   Hemoglobin 13.8 13.0 - 17.0 g/dL   HCT 42.6 39.0 - 52.0 %   MCV 97.9 80.0 - 100.0 fL   MCH 31.7 26.0 - 34.0 pg   MCHC 32.4 30.0 - 36.0 g/dL   RDW 13.2 11.5 - 15.5 %   Platelets 191 150 - 400 K/uL   nRBC 0.0 0.0 - 0.2 %   Neutrophils Relative % 84 %   Neutro Abs 10.0 (H) 1.7 - 7.7 K/uL   Lymphocytes Relative 10 %   Lymphs Abs 1.2 0.7 - 4.0 K/uL   Monocytes Relative 4 %   Monocytes Absolute 0.5 0.1 - 1.0 K/uL   Eosinophils Relative 1 %   Eosinophils Absolute  0.1 0.0 - 0.5 K/uL   Basophils Relative 0 %   Basophils Absolute 0.0 0.0 - 0.1 K/uL   Immature Granulocytes 1 %   Abs Immature Granulocytes 0.06 0.00 - 0.07 K/uL    Comment: Performed at [facility], [address] [place], [place] [address]  Comprehensive metabolic panel     Status: Abnormal   Collection Time: 07/08/2019 11:44 AM  Result Value Ref Range   Sodium 137 135 - 145 mmol/L   Potassium 3.5 3.5 - 5.1 mmol/L   Chloride 99 98 - 111 mmol/L   CO2 24 22 - 32 mmol/L   Glucose, Bld 148 (H) 70 - 99 mg/dL    Comment: Glucose reference range applies only to samples taken after fasting for at least 8 hours.   BUN 18 8 - 23 mg/dL   Creatinine, Ser 1.03 0.61 - 1.24 mg/dL   Calcium 9.0 8.9 - 10.3 mg/dL   Total Protein 6.8 6.5 - 8.1 g/dL   Albumin 3.4 (L) 3.5 - 5.0 g/dL   AST 20 15 - 41 U/L   ALT 15 0 - 44 U/L   Alkaline Phosphatase 68 38 - 126 U/L   Total Bilirubin 1.0 0.3 - 1.2 mg/dL   GFR calc non Af Amer >60 >60 mL/min   GFR calc Af Amer >60 >60 mL/min   Anion gap 14 5 - 15    Comment: Performed at [facility], [place] [address], [place], [place] [address]  Magnesium     Status: None   Collection Time: 07/11/2019 11:44 AM  Result Value Ref Range   Magnesium 1.8 1.7 - 2.4 mg/dL    Comment: Performed at [facility] [address] [place], [place] [address]  Troponin I (High Sensitivity)     Status: Abnormal   Collection Time: 07/11/2019 11:44 AM  Result Value Ref Range   Troponin I (High Sensitivity) 26 (H) <18 ng/L    Comment: (NOTE) Elevated high sensitivity troponin I (hsTnI) values and significant  changes across serial measurements may suggest ACS but many other  chronic and acute conditions are known to elevate hsTnI results.  Refer to the "Links" section for chest pain algorithms and additional  guidance. Performed at [facility], [place] [address], [place], [place] [address]   Protime-INR     Status: None   Collection Time: 06/21/2019 11:44 AM   Result Value Ref Range   Prothrombin Time 13.2 11.4 - 15.2 seconds   INR 1.0 0.8 - 1.2    Comment: (NOTE) INR goal varies based on device and disease states. Performed  at [facility], [place] [address], [place], [place] [address]   Lactic acid, plasma     Status: None   Collection Time: 07/04/2019 12:10 PM  Result Value Ref Range   Lactic Acid, Venous 1.4 0.5 - 1.9 mmol/L    Comment: Performed at [facility] [address], [place], [place] [address]   DG Chest 1 View  Result Date: 06/19/2019 CLINICAL DATA:  Pneumothorax. EXAM: CHEST  1 VIEW 4:21 p.m. COMPARISON:  06/19/2019 at 7:49 a.m. and chest CT dated 06/19/2019 at 12:17 p.m. FINDINGS: The right-sided chest tube is been removed. Extensive subcutaneous emphysema is unchanged. Heart size and vascularity are normal. Mediastinal emphysema is also unchanged. There is a tiny peripheral right pneumothorax which is unchanged since the prior CT scan. No infiltrates or effusions.  No acute bone abnormality. IMPRESSION: 1. No change in the tiny peripheral right pneumothorax after chest tube removal. 2. No change in the extensive subcutaneous and mediastinal emphysema. Electronically Signed   By: Lorriane Shire M.D.   On: 06/19/2019 16:52   DG Chest 2 View  Result Date: 06/19/2019 CLINICAL DATA:  Pneumothorax follow-up EXAM: CHEST - 2 VIEW COMPARISON:  Yesterday FINDINGS: Right-sided chest tube in similar position. New pneumomediastinum and increased right pneumothorax, now seen at the base. Extensive chest wall emphysema. A left apical lucency may be extrapleural gas given constellation of findings, or trace pneumothorax. Normal heart size. IMPRESSION: 1. Increased right pneumothorax, now also seen at the base. 2. New pneumomediastinum and extensive chest wall emphysema. 3. Lucency at the left apex could be extrapleural or pneumothorax, consider short follow-up. Electronically Signed   By: Monte Fantasia M.D.   On: 06/19/2019 10:29   DG  Ribs Unilateral Right  Result Date: 06/19/2019 CLINICAL DATA:  Chest tube EXAM: RIGHT RIBS - 2 VIEW COMPARISON:  Chest x-ray from the same time in yesterday FINDINGS: Known right pneumothorax and pneumomediastinum. A right chest tube is in place. No visible rib fracture or erosion, but limited by the degree of soft tissue emphysema. IMPRESSION: Negative right rib series.  Referenced contemporaneous chest x-ray. Electronically Signed   By: Monte Fantasia M.D.   On: 06/19/2019 10:36   CT CHEST WO CONTRAST  Result Date: 06/19/2019 CLINICAL DATA:  Pneumothorax, chest tube placement, subsequent chest wall emphysema and pneumomediastinum EXAM: CT CHEST WITHOUT CONTRAST TECHNIQUE: Multidetector CT imaging of the chest was performed following the standard protocol without IV contrast. Sagittal and coronal MPR images reconstructed from axial data set. 06/19/2019 COMPARISON:  Chest radiograph FINDINGS: Cardiovascular: Atherosclerotic calcifications aorta and coronary arteries. Aorta normal caliber. Heart unremarkable. Mediastinum/Nodes: Extensive mediastinal pneumatosis. Extensive chest wall pneumatosis RIGHT greater than LEFT. Extension of chest wall and mediastinal pneumatosis into the inferior cervical regions bilaterally. Base of cervical region otherwise normal appearance. Esophagus unremarkable. Lungs/Pleura: Pigtail RIGHT thoracostomy tube at the anterior mid chest. Persistent small RIGHT pneumothorax. Scattered subpleural blebs greater on RIGHT. Minimal central peribronchial thickening. Subpleural pneumatosis bilaterally. Subpleural pneumatosis LEFT apex, without definite LEFT apex pneumothorax. No endobronchial lesions. Small focus of ground-glass infiltrate in anterior LEFT upper lobe/lingula. Mild compressive atelectasis RIGHT lower lobe. No significant pleural effusion or pulmonary mass. Upper Abdomen: Visualized upper abdomen unremarkable Musculoskeletal: No acute osseous findings. IMPRESSION: Persistent  small RIGHT pneumothorax despite pigtail thoracostomy tube. Extensive chest wall, mediastinal, and cervical pneumatosis, greater on RIGHT. Scattered subpleural blebs especially at apices with small focus of ground-glass infiltrate in anterior LEFT upper lobe/lingula. Atherosclerotic calcifications including coronary arteries. Aortic Atherosclerosis (  ICD10-I70.0). Electronically Signed   By: Lavonia Dana M.D.   On: 06/19/2019 12:43   DG Chest Portable 1 View  Result Date: 07/03/2019 CLINICAL DATA:  Recent right pneumothorax. Patient left AMA after pulling tube. New onset shortness of breath. EXAM: PORTABLE CHEST 1 VIEW COMPARISON:  06/19/2019.  CT 06/19/2019. FINDINGS: Near complete right pneumothorax is noted. Near complete atelectasis of the right lung. Diffuse interstitial prominence left lung. Heart size stable. Diffuse bilateral chest wall subcutaneous emphysema is again noted. IMPRESSION: Near complete right pneumothorax with atelectasis of the right lung. Critical Value/emergent results were called by telephone at the time of interpretation on 07/11/2019 at 12:39 pm to provider [facility] , who verbally acknowledged these results. Electronically Signed   By: Marcello Moores  Register   On: 07/06/2019 12:42      Assessment/Plan R PTX - CT placed.  Follow up CXR pending.  CT to -20suction.  Follow up CXR in am.  IS/pulm toilet.  Mobilize.  This possibly a spontaneous PTX since patient is actually unclear if he truly fell or not.  No evidence of rib fractures.   A fib with RVR, new onset - cardiology consult pending.  Place in progressive incase drip needed.  Hopefully after lung re-expands, this improves.  Klein for anticoagulation from our standpoint if warranted. HLD - resume home meds ETOH use - CIWA in case needed Tobacco abuse - likely has underlying COPD, prn nebs ordered FEN - regular diet, colace VTE - lovenox ID - none currently Admit - inpatient  Henreitta Cea, [facility]  Surgery 07/02/2019, 2:33 PM Please see Amion for pager number during day hours 7:00am-4:30pm or 7:00am -11:30am on weekends

## 2019-06-21 ENCOUNTER — Inpatient Hospital Stay (HOSPITAL_COMMUNITY): Payer: Medicare HMO

## 2019-06-21 ENCOUNTER — Other Ambulatory Visit (HOSPITAL_COMMUNITY): Payer: Medicare HMO

## 2019-06-21 DIAGNOSIS — I361 Nonrheumatic tricuspid (valve) insufficiency: Secondary | ICD-10-CM

## 2019-06-21 DIAGNOSIS — J441 Chronic obstructive pulmonary disease with (acute) exacerbation: Secondary | ICD-10-CM

## 2019-06-21 DIAGNOSIS — I480 Paroxysmal atrial fibrillation: Secondary | ICD-10-CM

## 2019-06-21 DIAGNOSIS — I340 Nonrheumatic mitral (valve) insufficiency: Secondary | ICD-10-CM

## 2019-06-21 DIAGNOSIS — J939 Pneumothorax, unspecified: Secondary | ICD-10-CM

## 2019-06-21 LAB — CBC
HCT: 36.1 % — ABNORMAL LOW (ref 39.0–52.0)
Hemoglobin: 12.1 g/dL — ABNORMAL LOW (ref 13.0–17.0)
MCH: 32.4 pg (ref 26.0–34.0)
MCHC: 33.5 g/dL (ref 30.0–36.0)
MCV: 96.5 fL (ref 80.0–100.0)
Platelets: 173 10*3/uL (ref 150–400)
RBC: 3.74 MIL/uL — ABNORMAL LOW (ref 4.22–5.81)
RDW: 13 % (ref 11.5–15.5)
WBC: 12 10*3/uL — ABNORMAL HIGH (ref 4.0–10.5)
nRBC: 0 % (ref 0.0–0.2)

## 2019-06-21 LAB — BLOOD GAS, ARTERIAL
Acid-Base Excess: 2.1 mmol/L — ABNORMAL HIGH (ref 0.0–2.0)
Bicarbonate: 26.1 mmol/L (ref 20.0–28.0)
Drawn by: 519031
FIO2: 80
O2 Saturation: 98.1 %
Patient temperature: 37
pCO2 arterial: 40.7 mmHg (ref 32.0–48.0)
pH, Arterial: 7.423 (ref 7.350–7.450)
pO2, Arterial: 109 mmHg — ABNORMAL HIGH (ref 83.0–108.0)

## 2019-06-21 LAB — BASIC METABOLIC PANEL
Anion gap: 11 (ref 5–15)
BUN: 14 mg/dL (ref 8–23)
CO2: 25 mmol/L (ref 22–32)
Calcium: 8.3 mg/dL — ABNORMAL LOW (ref 8.9–10.3)
Chloride: 100 mmol/L (ref 98–111)
Creatinine, Ser: 0.86 mg/dL (ref 0.61–1.24)
GFR calc Af Amer: >60 mL/min (ref >60)
GFR calc non Af Amer: >60 mL/min (ref >60)
Glucose, Bld: 132 mg/dL — ABNORMAL HIGH (ref 70–99)
Potassium: 3.5 mmol/L (ref 3.5–5.1)
Sodium: 136 mmol/L (ref 135–145)

## 2019-06-21 LAB — TSH: TSH: 1.488 u[IU]/mL (ref 0.350–4.500)

## 2019-06-21 LAB — AMMONIA: Ammonia: 37 umol/L — ABNORMAL HIGH (ref 9–35)

## 2019-06-21 LAB — MRSA PCR SCREENING: MRSA by PCR: NEGATIVE

## 2019-06-21 LAB — ECHOCARDIOGRAM COMPLETE

## 2019-06-21 MED ORDER — IPRATROPIUM-ALBUTEROL 0.5-2.5 (3) MG/3ML IN SOLN
3.0000 mL | Freq: Four times a day (QID) | RESPIRATORY_TRACT | Status: DC
  Administered 2019-06-21: 3 mL via RESPIRATORY_TRACT
  Filled 2019-06-21: qty 3

## 2019-06-21 MED ORDER — SODIUM CHLORIDE 0.9 % IV SOLN
1.0000 g | INTRAVENOUS | Status: DC
  Administered 2019-06-21 – 2019-06-25 (×5): 1 g via INTRAVENOUS
  Filled 2019-06-21: qty 1
  Filled 2019-06-21 (×2): qty 10
  Filled 2019-06-21 (×3): qty 1

## 2019-06-21 MED ORDER — SODIUM CHLORIDE 0.9 % IV SOLN
500.0000 mg | INTRAVENOUS | Status: AC
  Administered 2019-06-21 – 2019-06-25 (×5): 500 mg via INTRAVENOUS
  Filled 2019-06-21 (×8): qty 500

## 2019-06-21 MED ORDER — METOPROLOL TARTRATE 50 MG PO TABS
50.0000 mg | ORAL_TABLET | Freq: Two times a day (BID) | ORAL | Status: DC
  Administered 2019-06-21 – 2019-06-22 (×2): 50 mg via ORAL
  Filled 2019-06-21 (×2): qty 1

## 2019-06-21 MED ORDER — LORAZEPAM 2 MG/ML IJ SOLN
2.0000 mg | Freq: Once | INTRAMUSCULAR | Status: AC
  Administered 2019-06-21: 2 mg via INTRAVENOUS

## 2019-06-21 MED ORDER — BUDESONIDE 0.5 MG/2ML IN SUSP
0.5000 mg | Freq: Two times a day (BID) | RESPIRATORY_TRACT | Status: AC
  Administered 2019-06-21 – 2019-06-23 (×4): 0.5 mg via RESPIRATORY_TRACT
  Filled 2019-06-21 (×4): qty 2

## 2019-06-21 MED ORDER — IPRATROPIUM-ALBUTEROL 0.5-2.5 (3) MG/3ML IN SOLN
3.0000 mL | Freq: Four times a day (QID) | RESPIRATORY_TRACT | Status: DC | PRN
  Administered 2019-06-22 – 2019-06-28 (×5): 3 mL via RESPIRATORY_TRACT
  Filled 2019-06-21 (×5): qty 3

## 2019-06-21 MED ORDER — HALOPERIDOL LACTATE 5 MG/ML IJ SOLN
5.0000 mg | Freq: Four times a day (QID) | INTRAMUSCULAR | Status: DC | PRN
  Administered 2019-06-21 – 2019-06-26 (×8): 5 mg via INTRAVENOUS
  Filled 2019-06-21 (×8): qty 1

## 2019-06-21 NOTE — Progress Notes (Signed)
Patient not awake enough to eat lunch/dinner today. Took a few sips of coke per his request but that's all. Remains lethargic, sleepy, and able to respond to voice. Intermittently responds to questions where he is oriented to himself but nothing else. He continues to try to get out of bed and is able to say  where I can understand  "I'm going home" IV pulled out and he remains restless, Haldol IV PRN given which calmed him down. Pt left in bed, bed locked and in lowest position, floor mats in place. Will continue to monitor.

## 2019-06-21 NOTE — Progress Notes (Signed)
RT NOTES: ABG obtained and sent to lab. Lab tech [employer] notified.

## 2019-06-21 NOTE — Discharge Instructions (Signed)
                   [facility] provides free cessation services to any [place] resident who needs help quitting commercial tobacco use, which includes all tobacco products offered for sale, not tobacco used for sacred and traditional ceremonies by many [place] tribes and communities. Quit Coaching is available in different forms, which can be used separately or together, to help any tobacco user give up tobacco. 1-800-QUIT-NOW 817-600-2743)   Intensive Outpatient Programs  [place]. [address]     [place] [address] [place],  [place], [place]      [place]  (Inpatient and outpatient)  [phone] (Suboxone and Methadone) [address]           [phone]           [facility]: Alcohol & Drug Services    [facility] - Intensive Outpatient [address]     [address] [place] [address] [place], [place] [address]     [place], [place]      [phone]  [facility] (Outpatient, Inpatient, Chemical  [facility] (Groups and Residental) (insurance only) [phone]    [place], [place]       [facility]    [facility] (for caregivers and family) [address]     [address] [place], [place], [place]      [phone]  Residential Treatment Programs  [place]  [place](2 years) Residential: 8 days)  [facility] [place], [place], [place] [phone]      [phone] or [phone]  [facility]    [facility] [address]      [place], [place], [place]      [phone]  [place]   [facility] ([facility]) [place]     [address] [place], [place] [address]     [place], [place]      [phone] Admissions: 8am-3pm M-F  [facility]: Residential Program [phone] Days)              [facility]: [facility]  [place], [place], [place] or [phone]    (Walk in Hours over the weekend or by referral)   [facility] Crisis: [facility]:[phone] (for crisis response 24 hours a day)

## 2019-06-21 NOTE — Progress Notes (Addendum)
ABG noted and within normal limits  He did not have any neurological focality when he was seen, was able to stand, equal strength in upper extremities

## 2019-06-21 NOTE — Progress Notes (Signed)
Order CT head and ammonia levels

## 2019-06-21 NOTE — Progress Notes (Signed)
Message sent to Dr Grandville Silos patietn trying to get OOB w me in the room, ?percutaneous emphysema, and ?CT placement. Orders received.

## 2019-06-21 NOTE — Progress Notes (Signed)
Around 0250 patiemt became more anxious and confused thinking he was in another building. Patient began to pull at tubes unaware what they were. Instructed patient and reoriented patient to time, place and that I would be giving him some medication to help him relax (0305). Patient is now resting comfortably for the moment "snoring" without moving back and forth in the bed, trying to take his clothes off, or pulling at his chest tube and/ or IV tubing. Will continue to monitor.

## 2019-06-21 NOTE — Progress Notes (Signed)
Progress Note  Patient Name: Benjamin Carlson Date of Encounter: 06/21/2019  [facility] Cardiologist: Minus Breeding, MD   Subjective   Post pneumothorax, sleeping, no complaints  Inpatient Medications    Scheduled Meds: . docusate sodium  100 mg Oral BID  . enoxaparin (LOVENOX) injection  40 mg Subcutaneous Q24H  . folic acid  1 mg Oral Daily  . ipratropium-albuterol  3 mL Nebulization Q6H  . metoprolol tartrate  25 mg Oral BID  . multivitamin with minerals  1 tablet Oral Daily  . rosuvastatin  10 mg Oral Daily  . thiamine  100 mg Oral Daily   Or  . thiamine  100 mg Intravenous Daily   Continuous Infusions: . sodium chloride 75 mL/hr at 07/01/2019 2018   PRN Meds: acetaminophen, haloperidol lactate, LORazepam **OR** LORazepam, metoprolol tartrate, morphine injection, ondansetron **OR** ondansetron (ZOFRAN) IV, oxyCODONE   Vital Signs    Vitals:   06/21/19 0442 06/21/19 0817 06/21/19 0950 06/21/19 1009  BP: (!) 147/62 (!) 155/68  (!) 143/69  Pulse: (!) 101 (!) 108  (!) 105  Resp:  (!) 33  (!) 24  Temp:  98.5 F (36.9 C)  100 F (37.8 C)  TempSrc:  Oral  Axillary  SpO2:  90% 96% 96%    Intake/Output Summary (Last 24 hours) at 06/21/2019 1111 Last data filed at 06/21/2019 0700 Gross per 24 hour  Intake 1639.36 ml  Output 300 ml  Net 1339.36 ml   Last 3 Weights 06/18/2019 06/18/2019 02/17/2014  Weight (lbs) 175 lb 0.7 oz 170 lb 202 lb 1.6 oz  Weight (kg) 79.4 kg 77.111 kg 91.672 kg      Telemetry    Maintaining sinus rhythm, occasional PAC- Personally Reviewed  ECG    Previous A. fib- Personally Reviewed  Physical Exam   GEN: No acute distress.   Neck: No JVD Cardiac: RRR, no murmurs, rubs, or gallops.  Respiratory: Clear to auscultation bilaterally. GI: Soft, nontender, non-distended  MS: No edema; No deformity. Neuro:   Sleeping Psych: Unable  Labs    High Sensitivity Troponin:   Recent Labs  Lab 07/13/2019 1144 06/23/2019 1415  TROPONINIHS 26* 30*       Chemistry Recent Labs  Lab 06/18/19 0900 06/19/19 0636 06/30/2019 1144 06/28/2019 1543 06/21/19 0538  NA 138   < > 137 140 136  K 4.0   < > 3.5 3.5 3.5  CL 100   < > 99 104 100  CO2 26   < > 24 26 25   GLUCOSE 158*   < > 148* 143* 132*  BUN 11   < > 18 14 14   CREATININE 0.82   < > 1.03 0.91 0.86  CALCIUM 9.0   < > 9.0 8.7* 8.3*  PROT 7.7  --  6.8 6.5  --   ALBUMIN 4.1  --  3.4* 3.0*  --   AST 21  --  20 23  --   ALT 14  --  15 14  --   ALKPHOS 81  --  68 64  --   BILITOT 0.7  --  1.0 0.9  --   GFRNONAA >60   < > >60 >60 >60  GFRAA >60   < > >60 >60 >60  ANIONGAP 12   < > 14 10 11    < > = values in this interval not displayed.     Hematology Recent Labs  Lab 06/28/2019 1144 07/05/2019 1543 06/21/19 0538  WBC 11.8* 7.4  12.0*  RBC 4.35 4.07* 3.74*  HGB 13.8 13.0 12.1*  HCT 42.6 39.4 36.1*  MCV 97.9 96.8 96.5  MCH 31.7 31.9 32.4  MCHC 32.4 33.0 33.5  RDW 13.2 13.1 13.0  PLT 191 177 173    BNPNo results for input(s): BNP, PROBNP in the last 168 hours.   DDimer No results for input(s): DDIMER in the last 168 hours.   Radiology    DG Chest 1 View  Result Date: 06/19/2019 CLINICAL DATA:  Pneumothorax. EXAM: CHEST  1 VIEW 4:21 p.m. COMPARISON:  06/19/2019 at 7:49 a.m. and chest CT dated 06/19/2019 at 12:17 p.m. FINDINGS: The right-sided chest tube is been removed. Extensive subcutaneous emphysema is unchanged. Heart size and vascularity are normal. Mediastinal emphysema is also unchanged. There is a tiny peripheral right pneumothorax which is unchanged since the prior CT scan. No infiltrates or effusions.  No acute bone abnormality. IMPRESSION: 1. No change in the tiny peripheral right pneumothorax after chest tube removal. 2. No change in the extensive subcutaneous and mediastinal emphysema. Electronically Signed   By: Lorriane Shire M.D.   On: 06/19/2019 16:52   CT CHEST WO CONTRAST  Result Date: 06/19/2019 CLINICAL DATA:  Pneumothorax, chest tube placement, subsequent  chest wall emphysema and pneumomediastinum EXAM: CT CHEST WITHOUT CONTRAST TECHNIQUE: Multidetector CT imaging of the chest was performed following the standard protocol without IV contrast. Sagittal and coronal MPR images reconstructed from axial data set. 06/19/2019 COMPARISON:  Chest radiograph FINDINGS: Cardiovascular: Atherosclerotic calcifications aorta and coronary arteries. Aorta normal caliber. Heart unremarkable. Mediastinum/Nodes: Extensive mediastinal pneumatosis. Extensive chest wall pneumatosis RIGHT greater than LEFT. Extension of chest wall and mediastinal pneumatosis into the inferior cervical regions bilaterally. Base of cervical region otherwise normal appearance. Esophagus unremarkable. Lungs/Pleura: Pigtail RIGHT thoracostomy tube at the anterior mid chest. Persistent small RIGHT pneumothorax. Scattered subpleural blebs greater on RIGHT. Minimal central peribronchial thickening. Subpleural pneumatosis bilaterally. Subpleural pneumatosis LEFT apex, without definite LEFT apex pneumothorax. No endobronchial lesions. Small focus of ground-glass infiltrate in anterior LEFT upper lobe/lingula. Mild compressive atelectasis RIGHT lower lobe. No significant pleural effusion or pulmonary mass. Upper Abdomen: Visualized upper abdomen unremarkable Musculoskeletal: No acute osseous findings. IMPRESSION: Persistent small RIGHT pneumothorax despite pigtail thoracostomy tube. Extensive chest wall, mediastinal, and cervical pneumatosis, greater on RIGHT. Scattered subpleural blebs especially at apices with small focus of ground-glass infiltrate in anterior LEFT upper lobe/lingula. Atherosclerotic calcifications including coronary arteries. Aortic Atherosclerosis (ICD10-I70.0). Electronically Signed   By: Lavonia Dana M.D.   On: 06/19/2019 12:43   DG Chest Port 1 View  Result Date: 06/21/2019 CLINICAL DATA:  Follow-up pneumothorax and extensive subcutaneous emphysema. Right chest tube. EXAM: PORTABLE CHEST 1  VIEW COMPARISON:  07/10/2019 and older studies. FINDINGS: Right lateral upper hemithorax chest tube is stable. No visualized pneumothorax. No pleural effusion. Bilateral airspace lung opacities appear increased from the prior study. These may reflect infection or atelectasis. Pulmonary edema is a less likely a possibility. Extensive subcutaneous emphysema is similar to the previous chest radiograph. Pneumomediastinum is less apparent. IMPRESSION: 1. Worsened lung aeration with new or increased airspace lung opacities bilaterally, which may reflect infection or atelectasis, or less likely pulmonary edema. 2. No visualized pneumothorax.  Stable right-sided chest tube. 3. Stable extensive subcutaneous emphysema. Electronically Signed   By: Lajean Manes M.D.   On: 06/21/2019 09:39   DG Chest Port 1 View  Result Date: 06/21/2019 CLINICAL DATA:  Chest tube placement EXAM: PORTABLE CHEST 1 VIEW COMPARISON:  07/09/2019 at 1207 hours FINDINGS:  Interval placement of right pigtail pleural drainage catheter. Re-expansion of the right lung with no discernible residual right-sided pneumothorax. Similar prominent interstitial markings bilaterally without new airspace consolidation. Stable cardiomediastinal contours. Extensive bilateral chest wall subcutaneous emphysema, right greater than left. IMPRESSION: 1. Interval placement of right chest tube with re-expansion of the right lung. No discernible residual right-sided pneumothorax. 2. Extensive bilateral chest wall subcutaneous emphysema, right greater than left. Electronically Signed   By: Davina Poke D.O.   On: 06/22/2019 16:08   DG Chest Portable 1 View  Result Date: 07/15/2019 CLINICAL DATA:  Recent right pneumothorax. Patient left AMA after pulling tube. New onset shortness of breath. EXAM: PORTABLE CHEST 1 VIEW COMPARISON:  06/19/2019.  CT 06/19/2019. FINDINGS: Near complete right pneumothorax is noted. Near complete atelectasis of the right lung. Diffuse  interstitial prominence left lung. Heart size stable. Diffuse bilateral chest wall subcutaneous emphysema is again noted. IMPRESSION: Near complete right pneumothorax with atelectasis of the right lung. Critical Value/emergent results were called by telephone at the time of interpretation on 07/08/2019 at 12:39 pm to provider [facility] , who verbally acknowledged these results. Electronically Signed   By: Marcello Moores  Register   On: 06/23/2019 12:42    Cardiac Studies   Echo pending  Patient Profile     74 y.o. male with paroxysmal atrial fibrillation in the setting of pneumothorax  Assessment & Plan    Paroxysmal atrial fibrillation -Agree that given low CHA2DS2-VASc score of 1 he does not warrant anticoagulation. -Also this seems to be an inciting/potentially reversible event causing the atrial fibrillation. -Awaiting echocardiogram -Beta-blocker started yesterday for tachycardia.  We will go ahead and increase to 50 twice daily of metoprolol  Hyperlipidemia -Continue with Crestor  Alcohol use -Protocol in place to watch for withdrawal, currently sleeping  Tobacco use -Continue to encourage cessation  Pneumothorax -Per surgical team  For questions or updates, please contact [facility] Please consult [email or web address] for contact info under        Signed, Candee Furbish, MD  06/21/2019, 11:11 AM

## 2019-06-21 NOTE — Consult Note (Addendum)
   NAME:  Benjamin Carlson, MRN:  [id number], DOB:  Feb 15, 1945, LOS: 1 ADMISSION DATE:  07/11/2019, CONSULTATION DATE:  06/21/2019  REFERRING MD:  Trauma attending, CHIEF COMPLAINT:  Pneumothorax, wheezing   Brief History   Patient had had a fall and developed some chest discomfort, went to [facility] where it was discovered that he had a 50% pneumothorax, chest tube was placed. Patient signed out AMA, removed his chest tube. Was seen 06/24/2019 at [facility], ED with shortness of breath noted to have crepitus on his chest wall.  Trauma service placed chest tube. Noted to have atrial fibrillation which is being managed  Significant Hospital Events   Chest tube placement 6/4 Chest tube placement 6/3 Consults:  pccm  Procedures:  6/4 tube placement  Significant Diagnostic Tests:  CT scan of the chest 6/3 IMPRESSION: Persistent small RIGHT pneumothorax despite pigtail thoracostomy tube.  Extensive chest wall, mediastinal, and cervical pneumatosis, greater on RIGHT.  Scattered subpleural blebs especially at apices with small focus of ground-glass infiltrate in anterior LEFT upper lobe/lingula.  Atherosclerotic calcifications including coronary arteries.  Chest x-ray 6/5 shows extensive subcu emphysema, no pneumothorax appreciated  Micro Data:  MRSA by PCR negative  Antimicrobials:     Interim history/subjective:  Somnolent, does arouse easily Does not appear to be in pain or discomfort  Objective   Blood pressure (!) 126/47, pulse 96, temperature 98.5 F (36.9 C), temperature source Oral, resp. rate 20, SpO2 100 %.        Intake/Output Summary (Last 24 hours) at 06/21/2019 1402 Last data filed at 06/21/2019 0700 Gross per 24 hour  Intake 1639.36 ml  Output 300 ml  Net 1339.36 ml   There were no vitals filed for this visit.  Examination: General: Elderly, does not appear to be in distress HENT: Moist oral mucosa Lungs: Crepitus around upper chest, fair air  entry with some rhonchi at the bases Cardiovascular: S1-S2 appreciated Abdomen: Bowel sounds appreciated  Chest tube to -20 suction-no airleak noted  CT scan of the chest was reviewed showing significant for blebs  Chest x-ray shows mild pulmonary congestion  Resolved Hospital Problem list     Assessment & Plan:  Pneumothorax secondary to trauma -Patient fell -Has chest tube in place at present -Does not appear to have an active leak at present  Chronic obstructive pulmonary disease with exacerbation -Emphysema on CT scan of the chest -Multiple blebs noted -Still smokes significantly -Smoking cessation counseling  Possible pneumonia with worsening chest x-ray findings on congested cough  Atrial fibrillation -On beta-blockers  Daughter denies that he drinks actively at present, may have a beer once in a while  He does have a more congested cough, worsening chest x-ray findings.I will empirically start him on antibiotics for community-acquired pneumonia-start Rocephin and azithromycin  Follow-up on arterial blood gas Continue bronchodilators  Minimize sedation  Sherrilyn Rist, MD [place] [facility] Pager: [phone]

## 2019-06-21 NOTE — Progress Notes (Signed)
Patient is not awake enough for me to administer morning meds or try to give breakfast, morning meds not given at this time, Will attempt later.

## 2019-06-21 NOTE — Progress Notes (Signed)
   06/21/19 0817  Assess: MEWS Score  Temp 98.5 F (36.9 C)  BP (!) 155/68  Pulse Rate (!) 108  ECG Heart Rate (!) 109  Resp (!) 33  SpO2 90 %  O2 Device Room Air  Assess: MEWS Score  MEWS Temp 0  MEWS Systolic 0  MEWS Pulse 1  MEWS RR 2  MEWS LOC 0  MEWS Score 3  MEWS Score Color Yellow  Assess: if the MEWS score is Yellow or Red  Were vital signs taken at a resting state? No  Focused Assessment Documented focused assessment  Early Detection of Sepsis Score *See Row Information* Low  MEWS guidelines implemented *See Row Information* Yes  Treat  MEWS Interventions Consulted Respiratory Therapy (MD Byerly ordered PRN nebs)  Take Vital Signs  Increase Vital Sign Frequency  Yellow: Q 2hr X 2 then Q 4hr X 2, if remains yellow, continue Q 4hrs  Escalate  MEWS: Escalate Yellow: discuss with charge nurse/RN and consider discussing with provider and RRT  Notify: Charge Nurse/RN  Name of Charge Nurse/RN Notified Barbie, RN  Date Charge Nurse/RN Notified 06/21/19  Time Charge Nurse/RN Notified [id number]  Notify: Provider  Provider Name/Title Byerly  Date Provider Notified 06/21/19  Time Provider Notified 0900  Notification Type Face-to-face  Notification Reason Change in status (pt wheezing continues to be restless)  Response See new orders  Date of Provider Response 06/21/19  Time of Provider Response 0900   Paged Respiratory for neb treatment for wheezing. Subq emphysema remain very present. Pt continues to be restless and responds to voice.

## 2019-06-21 NOTE — Progress Notes (Signed)
Subjective/Chief Complaint: Confused this morning, trying to get out of bed earlier today.  Received 2 doses of haldol.     Objective: Vital signs in last 24 hours: Temp:  [98.1 F (36.7 C)-100 F (37.8 C)] 100 F (37.8 C) (06/05 1009) Pulse Rate:  [56-163] 105 (06/05 1009) Resp:  [15-33] 24 (06/05 1009) BP: (114-155)/(51-115) 143/69 (06/05 1009) SpO2:  [90 %-99 %] 96 % (06/05 1009) Last BM Date: 06/18/19  Intake/Output from previous day: 06/04 0701 - 06/05 0700 In: 1639.4 [I.V.:639.4; IV Piggyback:1000] Out: 300 [Urine:300] Intake/Output this shift: No intake/output data recorded.  General appearance: sedated, moving a bit.   Resp: breathing comfortably, wheezing significantly Cardio: regular rate and rhythm GI: soft, non tender.  + BS Extremities: extremities normal, atraumatic, no cyanosis or edema  Lab Results:  Recent Labs    06/24/2019 1543 06/21/19 0538  WBC 7.4 12.0*  HGB 13.0 12.1*  HCT 39.4 36.1*  PLT 177 173   BMET Recent Labs    06/17/2019 1543 06/21/19 0538  NA 140 136  K 3.5 3.5  CL 104 100  CO2 26 25  GLUCOSE 143* 132*  BUN 14 14  CREATININE 0.91 0.86  CALCIUM 8.7* 8.3*   PT/INR Recent Labs    07/04/2019 1144  LABPROT 13.2  INR 1.0   ABG No results for input(s): PHART, HCO3 in the last 72 hours.  Invalid input(s): PCO2, PO2  Studies/Results: DG Chest 1 View  Result Date: 06/19/2019 CLINICAL DATA:  Pneumothorax. EXAM: CHEST  1 VIEW 4:21 p.m. COMPARISON:  06/19/2019 at 7:49 a.m. and chest CT dated 06/19/2019 at 12:17 p.m. FINDINGS: The right-sided chest tube is been removed. Extensive subcutaneous emphysema is unchanged. Heart size and vascularity are normal. Mediastinal emphysema is also unchanged. There is a tiny peripheral right pneumothorax which is unchanged since the prior CT scan. No infiltrates or effusions.  No acute bone abnormality. IMPRESSION: 1. No change in the tiny peripheral right pneumothorax after chest tube removal. 2.  No change in the extensive subcutaneous and mediastinal emphysema. Electronically Signed   By: Lorriane Shire M.D.   On: 06/19/2019 16:52   CT CHEST WO CONTRAST  Result Date: 06/19/2019 CLINICAL DATA:  Pneumothorax, chest tube placement, subsequent chest wall emphysema and pneumomediastinum EXAM: CT CHEST WITHOUT CONTRAST TECHNIQUE: Multidetector CT imaging of the chest was performed following the standard protocol without IV contrast. Sagittal and coronal MPR images reconstructed from axial data set. 06/19/2019 COMPARISON:  Chest radiograph FINDINGS: Cardiovascular: Atherosclerotic calcifications aorta and coronary arteries. Aorta normal caliber. Heart unremarkable. Mediastinum/Nodes: Extensive mediastinal pneumatosis. Extensive chest wall pneumatosis RIGHT greater than LEFT. Extension of chest wall and mediastinal pneumatosis into the inferior cervical regions bilaterally. Base of cervical region otherwise normal appearance. Esophagus unremarkable. Lungs/Pleura: Pigtail RIGHT thoracostomy tube at the anterior mid chest. Persistent small RIGHT pneumothorax. Scattered subpleural blebs greater on RIGHT. Minimal central peribronchial thickening. Subpleural pneumatosis bilaterally. Subpleural pneumatosis LEFT apex, without definite LEFT apex pneumothorax. No endobronchial lesions. Small focus of ground-glass infiltrate in anterior LEFT upper lobe/lingula. Mild compressive atelectasis RIGHT lower lobe. No significant pleural effusion or pulmonary mass. Upper Abdomen: Visualized upper abdomen unremarkable Musculoskeletal: No acute osseous findings. IMPRESSION: Persistent small RIGHT pneumothorax despite pigtail thoracostomy tube. Extensive chest wall, mediastinal, and cervical pneumatosis, greater on RIGHT. Scattered subpleural blebs especially at apices with small focus of ground-glass infiltrate in anterior LEFT upper lobe/lingula. Atherosclerotic calcifications including coronary arteries. Aortic Atherosclerosis  (ICD10-I70.0). Electronically Signed   By: Crist Infante.D.  On: 06/19/2019 12:43   DG Chest Port 1 View  Result Date: 06/21/2019 CLINICAL DATA:  Follow-up pneumothorax and extensive subcutaneous emphysema. Right chest tube. EXAM: PORTABLE CHEST 1 VIEW COMPARISON:  07/03/2019 and older studies. FINDINGS: Right lateral upper hemithorax chest tube is stable. No visualized pneumothorax. No pleural effusion. Bilateral airspace lung opacities appear increased from the prior study. These may reflect infection or atelectasis. Pulmonary edema is a less likely a possibility. Extensive subcutaneous emphysema is similar to the previous chest radiograph. Pneumomediastinum is less apparent. IMPRESSION: 1. Worsened lung aeration with new or increased airspace lung opacities bilaterally, which may reflect infection or atelectasis, or less likely pulmonary edema. 2. No visualized pneumothorax.  Stable right-sided chest tube. 3. Stable extensive subcutaneous emphysema. Electronically Signed   By: Lajean Manes M.D.   On: 06/21/2019 09:39   DG Chest Port 1 View  Result Date: 06/28/2019 CLINICAL DATA:  Chest tube placement EXAM: PORTABLE CHEST 1 VIEW COMPARISON:  06/21/2019 at 1207 hours FINDINGS: Interval placement of right pigtail pleural drainage catheter. Re-expansion of the right lung with no discernible residual right-sided pneumothorax. Similar prominent interstitial markings bilaterally without new airspace consolidation. Stable cardiomediastinal contours. Extensive bilateral chest wall subcutaneous emphysema, right greater than left. IMPRESSION: 1. Interval placement of right chest tube with re-expansion of the right lung. No discernible residual right-sided pneumothorax. 2. Extensive bilateral chest wall subcutaneous emphysema, right greater than left. Electronically Signed   By: Davina Poke D.O.   On: 06/30/2019 16:08   DG Chest Portable 1 View  Result Date: 06/19/2019 CLINICAL DATA:  Recent right  pneumothorax. Patient left AMA after pulling tube. New onset shortness of breath. EXAM: PORTABLE CHEST 1 VIEW COMPARISON:  06/19/2019.  CT 06/19/2019. FINDINGS: Near complete right pneumothorax is noted. Near complete atelectasis of the right lung. Diffuse interstitial prominence left lung. Heart size stable. Diffuse bilateral chest wall subcutaneous emphysema is again noted. IMPRESSION: Near complete right pneumothorax with atelectasis of the right lung. Critical Value/emergent results were called by telephone at the time of interpretation on 07/09/2019 at 12:39 pm to provider [facility] , who verbally acknowledged these results. Electronically Signed   By: Marcello Moores  Register   On: 07/14/2019 12:42    Anti-infectives: Anti-infectives (From admission, onward)   None      Assessment/Plan: COPD, no formal diagnosis, but wheezing and blebs on chest CT  Right PTX, s/p chest tube on right.   Add nebulizers for wheezing.   Consider pulmonary or medicine consult if doesn't stabilize.   High risk for DTs, on ciwa protocol.     LOS: 1 day    Stark Klein 06/21/2019

## 2019-06-21 NOTE — Evaluation (Signed)
Physical Therapy Evaluation Patient Details Name: Benjamin Carlson MRN: [id number] DOB: [birth year] Today's Date: 06/21/2019   History of Present Illness  Patient is a 74 y/o male with only history of hyperlipidemia.  He was admitted to [facility] due to fall and pneumothorax with CT placed.  He signed out AMA and removed his chest tube.  He was admitted through ED with chest wall crepitus and 100% pneumothorax chest tube placed.  Clinical Impression  Patient presents with decreased mobility due to weakness, decreased balance, decreased arousal, decreased cognition/safety awareness with high risk for falls.  Currently on 15L high flow O2 and with R chest tube.  Unsafe for ambulation today, but was able to stand, but with heavy posterior lean.  Feel he will benefit from skilled PT in the acute setting and may need follow up SNF level rehab at d/c.    Follow Up Recommendations SNF    Equipment Recommendations  Other (comment)(TBA)    Recommendations for Other Services       Precautions / Restrictions Precautions Precautions: Fall Precaution Comments: delerium      Mobility  Bed Mobility Overal bed mobility: Needs Assistance Bed Mobility: Supine to Sit;Sit to Supine     Supine to sit: Max assist;Mod assist;+2 for safety/equipment Sit to supine: Max assist;+2 for physical assistance   General bed mobility comments: patient not initiating up to EOB initially needing max A, then back to bed for lab draw for blood gas, then pt initiating to get up so assisted back up with mod A, still +2 for safety, then back to supine with max A  Transfers Overall transfer level: Needs assistance   Transfers: Sit to/from Stand Sit to Stand: Mod assist;+2 safety/equipment         General transfer comment: stood from EOB wtih lifting help to stand, pt standing eyes closed and leaning posterior, mumbling and would open eyes briefly to command, but attempted steps and unable to safely  ambulate  Ambulation/Gait                Stairs            Wheelchair Mobility    Modified Rankin (Stroke Patients Only)       Balance Overall balance assessment: Needs assistance Sitting-balance support: Feet supported Sitting balance-Leahy Scale: Poor Sitting balance - Comments: leaning posterior at EOB assist mod for balance, at times max A   Standing balance support: Single extremity supported Standing balance-Leahy Scale: Poor Standing balance comment: leaning posterior, assist for standing balance mod A at least                             Pertinent Vitals/Pain Pain Assessment: Faces Faces Pain Scale: Hurts a little bit Pain Location: generalized, moaning with movement Pain Descriptors / Indicators: Grimacing;Moaning Pain Intervention(s): Monitored during session;Limited activity within patient's tolerance;Repositioned    Home Living Family/patient expects to be discharged to:: Private residence Living Arrangements: Alone Available Help at Discharge: Family Type of Home: House       Home Layout: One level Home Equipment: None      Prior Function Level of Independence: Independent         Comments: per daughter (who repeatedly reports she is his POA) patient very active and independent, but feels he is getting Alzheimer's     Hand Dominance        Extremity/Trunk Assessment   Upper Extremity Assessment Upper Extremity Assessment: Generalized weakness  Lower Extremity Assessment Lower Extremity Assessment: Generalized weakness       Communication      Cognition Arousal/Alertness: Lethargic Behavior During Therapy: Restless Overall Cognitive Status: Impaired/Different from baseline Area of Impairment: Attention;Following commands;Safety/judgement;Orientation                 Orientation Level: Disoriented to;Person;Place;Time;Situation Current Attention Level: Focused   Following Commands: Follows one step  commands with increased time;Follows one step commands inconsistently Safety/Judgement: Decreased awareness of safety;Decreased awareness of deficits     General Comments: lethargic, difficulty to rouse initially, then pt attempting to get up stating "I got to go to my crib"      General Comments General comments (skin integrity, edema, etc.): R chest tube, pt on 15 L high flow O2, HR 115, SpO2 96%    Exercises     Assessment/Plan    PT Assessment Patient needs continued PT services  PT Problem List Decreased strength;Decreased mobility;Decreased safety awareness;Decreased balance;Decreased knowledge of use of DME;Decreased activity tolerance;Decreased cognition;Cardiopulmonary status limiting activity       PT Treatment Interventions DME instruction;Therapeutic activities;Cognitive remediation;Gait training;Therapeutic exercise;Patient/family education;Functional mobility training;Balance training    PT Goals (Current goals can be found in the Care Plan section)  Acute Rehab PT Goals Patient Stated Goal: daughter aware he will need help PT Goal Formulation: With family Time For Goal Achievement: 07/05/19 Potential to Achieve Goals: Fair    Frequency Min 3X/week   Barriers to discharge        Co-evaluation               AM-PAC PT "6 Clicks" Mobility  Outcome Measure Help needed turning from your back to your side while in a flat bed without using bedrails?: A Lot Help needed moving from lying on your back to sitting on the side of a flat bed without using bedrails?: A Lot Help needed moving to and from a bed to a chair (including a wheelchair)?: A Lot Help needed standing up from a chair using your arms (e.g., wheelchair or bedside chair)?: A Lot Help needed to walk in hospital room?: Total Help needed climbing 3-5 steps with a railing? : Total 6 Click Score: 10    End of Session   Activity Tolerance: Patient limited by lethargy Patient left: in bed;with call  bell/phone within reach;with bed alarm set;with nursing/sitter in room   PT Visit Diagnosis: Other abnormalities of gait and mobility (R26.89);Other symptoms and signs involving the nervous system (R29.898);Muscle weakness (generalized) (M62.81)    Time: 1355-1420 PT Time Calculation (min) (ACUTE ONLY): 25 min   Charges:   PT Evaluation $PT Eval High Complexity: 1 High PT Treatments $Therapeutic Activity: 8-22 mins        Magda Kiel, [place] [facility] [phone] 06/21/2019   Reginia Naas 06/21/2019, 4:43 PM

## 2019-06-21 NOTE — Progress Notes (Addendum)
Pt intermittently responds to questions and doesn't open his eye. His daughter, Lenna Sciara Sales executive), came to bedside crying and wants to know update on patient and update on recent CT, Echo, etc, Paged Trauma to give her an update on plan   Update: Spoke with Jerene Pitch, [place], CCMD and pulmonologist will come see patient and update on care can be provided at that time.

## 2019-06-21 NOTE — Progress Notes (Signed)
  Echocardiogram 2D Echocardiogram has been performed.  Benjamin Carlson 06/21/2019, 2:02 PM

## 2019-06-21 NOTE — Progress Notes (Signed)
Message sent to Dr Grandville Silos on call for Trauma with patient more confused trying to pull tubes out with sats dipping.. Orders given immediately.

## 2019-06-22 ENCOUNTER — Inpatient Hospital Stay (HOSPITAL_COMMUNITY): Payer: Medicare HMO

## 2019-06-22 DIAGNOSIS — J189 Pneumonia, unspecified organism: Secondary | ICD-10-CM

## 2019-06-22 DIAGNOSIS — G9340 Encephalopathy, unspecified: Secondary | ICD-10-CM

## 2019-06-22 MED ORDER — METOPROLOL TARTRATE 50 MG PO TABS
100.0000 mg | ORAL_TABLET | Freq: Two times a day (BID) | ORAL | Status: DC
  Filled 2019-06-22: qty 2

## 2019-06-22 NOTE — Progress Notes (Signed)
[facility] Progress Note     Subjective: CC-  Still confused but no complaints this morning. Currently lethargic and sleepy, but per report he gets agitated when he is more awake. O2 sats 100% on 10L [place]. CXR this AM with trace PNX and persistent bilateral infiltrates.  Objective: Vital signs in last 24 hours: Temp:  [97.7 F (36.5 C)-102.6 F (39.2 C)] 99 F (37.2 C) (06/06 0756) Pulse Rate:  [89-128] 93 (06/06 0756) Resp:  [17-32] 17 (06/06 0500) BP: (105-141)/(46-80) 117/49 (06/06 0756) SpO2:  [96 %-100 %] 100 % (06/06 0846) Last BM Date: 06/18/19  Intake/Output from previous day: 06/05 0701 - 06/06 0700 In: 1713.7 [I.V.:1613.7; IV Piggyback:100] Out: 985 [Urine:925; Chest Tube:60] Intake/Output this shift: No intake/output data recorded.  PE: Gen:  Lethargic, NAD Card:  RRR Pulm: rhonchi and trace wheezing bilaterally, rate and effort normal on 10L [place] with O2 sats 100%, CT in place with air leak Abd: Soft, NT/ND, +BS Psych: Alert and oriented only to self Skin: no rashes noted, warm and dry  Lab Results:  Recent Labs    07/04/2019 1543 06/21/19 0538  WBC 7.4 12.0*  HGB 13.0 12.1*  HCT 39.4 36.1*  PLT 177 173   BMET Recent Labs    07/15/2019 1543 06/21/19 0538  NA 140 136  K 3.5 3.5  CL 104 100  CO2 26 25  GLUCOSE 143* 132*  BUN 14 14  CREATININE 0.91 0.86  CALCIUM 8.7* 8.3*   PT/INR Recent Labs    07/11/2019 1144  LABPROT 13.2  INR 1.0   CMP     Component Value Date/Time   NA 136 06/21/2019 0538   K 3.5 06/21/2019 0538   CL 100 06/21/2019 0538   CO2 25 06/21/2019 0538   GLUCOSE 132 (H) 06/21/2019 0538   BUN 14 06/21/2019 0538   CREATININE 0.86 06/21/2019 0538   CALCIUM 8.3 (L) 06/21/2019 0538   PROT 6.5 06/28/2019 1543   ALBUMIN 3.0 (L) 06/17/2019 1543   AST 23 07/07/2019 1543   ALT 14 07/09/2019 1543   ALKPHOS 64 07/07/2019 1543   BILITOT 0.9 06/25/2019 1543   GFRNONAA >60 06/21/2019 0538   GFRAA >60 06/21/2019 0538    Lipase  No results found for: LIPASE     Studies/Results: CT HEAD WO CONTRAST  Result Date: 06/21/2019 CLINICAL DATA:  Encephalopathy.  Additional provided: Pneumothorax EXAM: CT HEAD WITHOUT CONTRAST TECHNIQUE: Contiguous axial images were obtained from the base of the skull through the vertex without intravenous contrast. COMPARISON:  No pertinent prior studies available for comparison. FINDINGS: Brain: Ill-defined hypoattenuation within the cerebral white is nonspecific, but consistent with chronic small vessel ischemic disease. There is no acute intracranial hemorrhage. No demarcated cortical infarct. No extra-axial fluid collection. No evidence of intracranial mass. No midline shift. Vascular: No hyperdense vessel.  Atherosclerotic calcifications Skull: Normal. Negative for fracture or focal lesion. Sinuses/Orbits: Visualized orbits show no acute finding. Paranasal sinus mucosal thickening greatest within the left frontal sinus and bilateral ethmoid air cells. No significant mastoid effusion. Other: There is prominent multifocal subcutaneous gas within the visualized upper neck and maxillofacial soft tissues. IMPRESSION: No evidence of acute intracranial abnormality. Chronic small vessel ischemic changes within the cerebral white matter. Prominent multifocal subcutaneous gas within the visualized upper neck and maxillofacial soft tissues. Paranasal sinus mucosal thickening. Electronically Signed   By: Kellie Simmering DO   On: 06/21/2019 17:33   DG CHEST PORT 1 VIEW  Result Date: 06/22/2019 CLINICAL DATA:  Congested/ shob Patient had had a fall and developed some chest discomfort, went to [facility] where it was discovered that he had a 50% pneumothorax, chest tube was placed. EXAM: PORTABLE CHEST - 1 VIEW COMPARISON:  the previous day's study FINDINGS: Right pigtail chest tube remains directed laterally towards the apex. There is a trace lateral pneumothorax. Moderate right lateral  subcutaneous emphysema slightly improved. Extensive patchy airspace infiltrates throughout both lungs with relative sparing at the periphery of the bases. Heart size and mediastinal contours are within normal limits. No effusion. Visualized bones unremarkable. IMPRESSION: 1. Stable right chest tube with trace pneumothorax. 2. Persistent bilateral airspace disease Electronically Signed   By: Lucrezia Europe M.D.   On: 06/22/2019 10:44   DG Chest Port 1 View  Result Date: 06/21/2019 CLINICAL DATA:  Follow-up pneumothorax and extensive subcutaneous emphysema. Right chest tube. EXAM: PORTABLE CHEST 1 VIEW COMPARISON:  07/03/2019 and older studies. FINDINGS: Right lateral upper hemithorax chest tube is stable. No visualized pneumothorax. No pleural effusion. Bilateral airspace lung opacities appear increased from the prior study. These may reflect infection or atelectasis. Pulmonary edema is a less likely a possibility. Extensive subcutaneous emphysema is similar to the previous chest radiograph. Pneumomediastinum is less apparent. IMPRESSION: 1. Worsened lung aeration with new or increased airspace lung opacities bilaterally, which may reflect infection or atelectasis, or less likely pulmonary edema. 2. No visualized pneumothorax.  Stable right-sided chest tube. 3. Stable extensive subcutaneous emphysema. Electronically Signed   By: Lajean Manes M.D.   On: 06/21/2019 09:39   DG Chest Port 1 View  Result Date: 07/08/2019 CLINICAL DATA:  Chest tube placement EXAM: PORTABLE CHEST 1 VIEW COMPARISON:  07/08/2019 at 1207 hours FINDINGS: Interval placement of right pigtail pleural drainage catheter. Re-expansion of the right lung with no discernible residual right-sided pneumothorax. Similar prominent interstitial markings bilaterally without new airspace consolidation. Stable cardiomediastinal contours. Extensive bilateral chest wall subcutaneous emphysema, right greater than left. IMPRESSION: 1. Interval placement of right  chest tube with re-expansion of the right lung. No discernible residual right-sided pneumothorax. 2. Extensive bilateral chest wall subcutaneous emphysema, right greater than left. Electronically Signed   By: Davina Poke D.O.   On: 07/15/2019 16:08   DG Chest Portable 1 View  Result Date: 06/26/2019 CLINICAL DATA:  Recent right pneumothorax. Patient left AMA after pulling tube. New onset shortness of breath. EXAM: PORTABLE CHEST 1 VIEW COMPARISON:  06/19/2019.  CT 06/19/2019. FINDINGS: Near complete right pneumothorax is noted. Near complete atelectasis of the right lung. Diffuse interstitial prominence left lung. Heart size stable. Diffuse bilateral chest wall subcutaneous emphysema is again noted. IMPRESSION: Near complete right pneumothorax with atelectasis of the right lung. Critical Value/emergent results were called by telephone at the time of interpretation on 06/24/2019 at 12:39 pm to provider [facility] , who verbally acknowledged these results. Electronically Signed   By: Marcello Moores  Register   On: 06/26/2019 12:42   ECHOCARDIOGRAM COMPLETE  Result Date: 06/21/2019    ECHOCARDIOGRAM REPORT   Patient Name:   Benjamin Carlson Date of Exam: 06/21/2019 Medical Rec #:  [id number]    Height:       69.0 in Accession #:    [id number]   Weight:       175.0 lb Date of Birth:  1945-10-19    BSA:          1.952 m Patient Age:    18 years     BP:  126/47 mmHg Patient Gender: M            HR:           92 bpm. Exam Location:  Inpatient Procedure: 2D Echo Indications:    atrial fibrillation 427.31  History:        Patient has no prior history of Echocardiogram examinations.                 Risk Factors:Former Smoker.  Sonographer:    Johny Chess Referring Phys: [id number] [place]  1. Left ventricular ejection fraction, by estimation, is 45 to 50%. The left ventricle has mildly decreased function. The left ventricle demonstrates global hypokinesis. Left ventricular diastolic  parameters were normal.  2. Right ventricular systolic function is normal. The right ventricular size is normal. There is normal pulmonary artery systolic pressure. The estimated right ventricular systolic pressure is 62.3 mmHg.  3. Left atrial size was mildly dilated.  4. The mitral valve is normal in structure. Mild to moderate mitral valve regurgitation. No evidence of mitral stenosis.  5. The aortic valve is normal in structure. Aortic valve regurgitation is not visualized. No aortic stenosis is present.  6. The inferior vena cava is normal in size with greater than 50% respiratory variability, suggesting right atrial pressure of 3 mmHg.  7. Poor acoustical images limit accurate assessment of EF and wall motion. Recommend repeat limited study with definity contrast. FINDINGS  Left Ventricle: Left ventricular ejection fraction, by estimation, is 45 to 50%. The left ventricle has mildly decreased function. The left ventricle demonstrates global hypokinesis. The left ventricular internal cavity size was normal in size. There is  no left ventricular hypertrophy. Left ventricular diastolic parameters were normal. Normal left ventricular filling pressure. Right Ventricle: The right ventricular size is normal. No increase in right ventricular wall thickness. Right ventricular systolic function is normal. There is normal pulmonary artery systolic pressure. The tricuspid regurgitant velocity is 2.51 m/s, and  with an assumed right atrial pressure of 3 mmHg, the estimated right ventricular systolic pressure is 76.2 mmHg. Left Atrium: Left atrial size was mildly dilated. Right Atrium: Right atrial size was normal in size. Pericardium: There is no evidence of pericardial effusion. Mitral Valve: The mitral valve is normal in structure. Normal mobility of the mitral valve leaflets. Mild to moderate mitral valve regurgitation. No evidence of mitral valve stenosis. Tricuspid Valve: The tricuspid valve is normal in structure.  Tricuspid valve regurgitation is mild . No evidence of tricuspid stenosis. Aortic Valve: The aortic valve is normal in structure. Aortic valve regurgitation is not visualized. No aortic stenosis is present. Pulmonic Valve: The pulmonic valve was normal in structure. Pulmonic valve regurgitation is not visualized. No evidence of pulmonic stenosis. Aorta: The aortic root is normal in size and structure. Venous: The inferior vena cava is normal in size with greater than 50% respiratory variability, suggesting right atrial pressure of 3 mmHg. IAS/Shunts: The interatrial septum appears to be lipomatous. No atrial level shunt detected by color flow Doppler.  LEFT VENTRICLE PLAX 2D LVIDd:         4.90 cm  Diastology LVIDs:         3.80 cm  LV e' lateral:   12.80 cm/s LV PW:         1.00 cm  LV E/e' lateral: 7.6 LV IVS:        0.80 cm  LV e' medial:    8.49 cm/s LVOT diam:     1.70  cm  LV E/e' medial:  11.4 LV SV:         51 LV SV Index:   26 LVOT Area:     2.27 cm  RIGHT VENTRICLE             IVC RV S prime:     14.70 cm/s  IVC diam: 1.80 cm TAPSE (M-mode): 2.0 cm LEFT ATRIUM             Index       RIGHT ATRIUM           Index LA diam:        3.30 cm 1.69 cm/m  RA Area:     11.40 cm LA Vol (A2C):   81.4 ml 41.70 ml/m RA Volume:   26.40 ml  13.52 ml/m LA Vol (A4C):   50.3 ml 25.77 ml/m LA Biplane Vol: 67.2 ml 34.42 ml/m  AORTIC VALVE LVOT Vmax:   115.00 cm/s LVOT Vmean:  78.600 cm/s LVOT VTI:    0.223 m MITRAL VALVE               TRICUSPID VALVE MV Area (PHT): 4.36 cm    TR Peak grad:   25.2 mmHg MV Decel Time: 174 msec    TR Vmax:        251.00 cm/s MV E velocity: 96.70 cm/s MV A velocity: 92.00 cm/s  SHUNTS MV E/A ratio:  1.05        Systemic VTI:  0.22 m                            Systemic Diam: 1.70 cm Fransico Him MD Electronically signed by Fransico Him MD Signature Date/Time: 06/21/2019/4:35:07 PM    Final     Anti-infectives: Anti-infectives (From admission, onward)   Start     Dose/Rate Route Frequency  Ordered Stop   06/21/19 1600  azithromycin (ZITHROMAX) 500 mg in sodium chloride 0.9 % 250 mL IVPB     500 mg 250 mL/hr over 60 Minutes Intravenous Every 24 hours 06/21/19 1412     06/21/19 1500  cefTRIAXone (ROCEPHIN) 1 g in sodium chloride 0.9 % 100 mL IVPB     1 g 200 mL/hr over 30 Minutes Intravenous Every 24 hours 06/21/19 1412         Assessment/Plan ?Fall R PTX - s/p CT. Trace PNX on CXR, mild air leak, continue CT to -20. IS/pulm toilet. A fib with RVR, new onset - cardiology following, no evidence of A fib this AM, BB increased to 100mg  BID, no anticoagulation HLD - resume home meds ETOH use - likely having some withdrawal, continue CIWA COPD - appreciate CCM assistance Tobacco abuse Fever, PNA - started rocephin/azithromycin for CAP. Check Rcx Encephalopathy - head CT and abg WNL, may be 2/2 alcohol withdrawal. Per daughter has also been showing early signs of dementia  ID - azithromycin/ceftriaxone 6/5>> FEN - reg diet VTE - SCDs, lovenox Foley - none Follow up - TBD  Plan - Continue chest tube. Repeat CXR in AM.   LOS: 2 days    Wellington Hampshire, [facility] 06/22/2019, 11:20 AM Please see Amion for pager number during day hours 7:00am-4:30pm

## 2019-06-22 NOTE — Progress Notes (Addendum)
Progress Note  Patient Name: Benjamin Carlson Date of Encounter: 06/22/2019  [facility] Cardiologist: Minus Breeding, MD   Subjective   Somewhat confused.  Family member in room.  Fever noted.  Inpatient Medications    Scheduled Meds: . budesonide (PULMICORT) nebulizer solution  0.5 mg Nebulization BID  . docusate sodium  100 mg Oral BID  . enoxaparin (LOVENOX) injection  40 mg Subcutaneous Q24H  . folic acid  1 mg Oral Daily  . metoprolol tartrate  50 mg Oral BID  . multivitamin with minerals  1 tablet Oral Daily  . rosuvastatin  10 mg Oral Daily  . thiamine  100 mg Oral Daily   Or  . thiamine  100 mg Intravenous Daily   Continuous Infusions: . sodium chloride 75 mL/hr at 06/22/19 0500  . azithromycin 500 mg (06/21/19 1724)  . cefTRIAXone (ROCEPHIN)  IV Stopped (06/21/19 1625)   PRN Meds: acetaminophen, haloperidol lactate, ipratropium-albuterol, LORazepam **OR** LORazepam, metoprolol tartrate, morphine injection, ondansetron **OR** ondansetron (ZOFRAN) IV, oxyCODONE   Vital Signs    Vitals:   06/22/19 0600 06/22/19 0632 06/22/19 0756 06/22/19 0846  BP:   (!) 117/49   Pulse: (!) 128 89 93   Resp:      Temp:   99 F (37.2 C)   TempSrc:   Oral   SpO2:   96% 100%    Intake/Output Summary (Last 24 hours) at 06/22/2019 1056 Last data filed at 06/22/2019 0558 Gross per 24 hour  Intake 1713.65 ml  Output 965 ml  Net 748.65 ml   Last 3 Weights 06/18/2019 06/18/2019 02/17/2014  Weight (lbs) 175 lb 0.7 oz 170 lb 202 lb 1.6 oz  Weight (kg) 79.4 kg 77.111 kg 91.672 kg      Telemetry    Sinus rhythm/sinus tachycardia with PACs.  Do not see any atrial fibrillation- Personally Reviewed  ECG    No new- Personally Reviewed  Physical Exam   GEN: No acute distress.   Neck: No JVD Cardiac: RRR, no murmurs, rubs, or gallops.  Respiratory: Clear to auscultation bilaterally. GI: Soft, nontender, non-distended  MS: No edema; No deformity. Neuro:  Nonfocal  Psych:  Confused  Labs    High Sensitivity Troponin:   Recent Labs  Lab 07/06/2019 1144 06/28/2019 1415  TROPONINIHS 26* 30*      Chemistry Recent Labs  Lab 06/18/19 0900 06/19/19 0636 06/19/2019 1144 06/25/2019 1543 06/21/19 0538  NA 138   < > 137 140 136  K 4.0   < > 3.5 3.5 3.5  CL 100   < > 99 104 100  CO2 26   < > 24 26 25   GLUCOSE 158*   < > 148* 143* 132*  BUN 11   < > 18 14 14   CREATININE 0.82   < > 1.03 0.91 0.86  CALCIUM 9.0   < > 9.0 8.7* 8.3*  PROT 7.7  --  6.8 6.5  --   ALBUMIN 4.1  --  3.4* 3.0*  --   AST 21  --  20 23  --   ALT 14  --  15 14  --   ALKPHOS 81  --  68 64  --   BILITOT 0.7  --  1.0 0.9  --   GFRNONAA >60   < > >60 >60 >60  GFRAA >60   < > >60 >60 >60  ANIONGAP 12   < > 14 10 11    < > = values in this  interval not displayed.     Hematology Recent Labs  Lab 06/17/2019 1144 06/30/2019 1543 06/21/19 0538  WBC 11.8* 7.4 12.0*  RBC 4.35 4.07* 3.74*  HGB 13.8 13.0 12.1*  HCT 42.6 39.4 36.1*  MCV 97.9 96.8 96.5  MCH 31.7 31.9 32.4  MCHC 32.4 33.0 33.5  RDW 13.2 13.1 13.0  PLT 191 177 173    BNPNo results for input(s): BNP, PROBNP in the last 168 hours.   DDimer No results for input(s): DDIMER in the last 168 hours.   Radiology    CT HEAD WO CONTRAST  Result Date: 06/21/2019 CLINICAL DATA:  Encephalopathy.  Additional provided: Pneumothorax EXAM: CT HEAD WITHOUT CONTRAST TECHNIQUE: Contiguous axial images were obtained from the base of the skull through the vertex without intravenous contrast. COMPARISON:  No pertinent prior studies available for comparison. FINDINGS: Brain: Ill-defined hypoattenuation within the cerebral white is nonspecific, but consistent with chronic small vessel ischemic disease. There is no acute intracranial hemorrhage. No demarcated cortical infarct. No extra-axial fluid collection. No evidence of intracranial mass. No midline shift. Vascular: No hyperdense vessel.  Atherosclerotic calcifications Skull: Normal. Negative for  fracture or focal lesion. Sinuses/Orbits: Visualized orbits show no acute finding. Paranasal sinus mucosal thickening greatest within the left frontal sinus and bilateral ethmoid air cells. No significant mastoid effusion. Other: There is prominent multifocal subcutaneous gas within the visualized upper neck and maxillofacial soft tissues. IMPRESSION: No evidence of acute intracranial abnormality. Chronic small vessel ischemic changes within the cerebral white matter. Prominent multifocal subcutaneous gas within the visualized upper neck and maxillofacial soft tissues. Paranasal sinus mucosal thickening. Electronically Signed   By: Kellie Simmering DO   On: 06/21/2019 17:33   DG CHEST PORT 1 VIEW  Result Date: 06/22/2019 CLINICAL DATA:  Congested/ shob Patient had had a fall and developed some chest discomfort, went to [facility] where it was discovered that he had a 50% pneumothorax, chest tube was placed. EXAM: PORTABLE CHEST - 1 VIEW COMPARISON:  the previous day's study FINDINGS: Right pigtail chest tube remains directed laterally towards the apex. There is a trace lateral pneumothorax. Moderate right lateral subcutaneous emphysema slightly improved. Extensive patchy airspace infiltrates throughout both lungs with relative sparing at the periphery of the bases. Heart size and mediastinal contours are within normal limits. No effusion. Visualized bones unremarkable. IMPRESSION: 1. Stable right chest tube with trace pneumothorax. 2. Persistent bilateral airspace disease Electronically Signed   By: Lucrezia Europe M.D.   On: 06/22/2019 10:44   DG Chest Port 1 View  Result Date: 06/21/2019 CLINICAL DATA:  Follow-up pneumothorax and extensive subcutaneous emphysema. Right chest tube. EXAM: PORTABLE CHEST 1 VIEW COMPARISON:  06/26/2019 and older studies. FINDINGS: Right lateral upper hemithorax chest tube is stable. No visualized pneumothorax. No pleural effusion. Bilateral airspace lung opacities appear  increased from the prior study. These may reflect infection or atelectasis. Pulmonary edema is a less likely a possibility. Extensive subcutaneous emphysema is similar to the previous chest radiograph. Pneumomediastinum is less apparent. IMPRESSION: 1. Worsened lung aeration with new or increased airspace lung opacities bilaterally, which may reflect infection or atelectasis, or less likely pulmonary edema. 2. No visualized pneumothorax.  Stable right-sided chest tube. 3. Stable extensive subcutaneous emphysema. Electronically Signed   By: Lajean Manes M.D.   On: 06/21/2019 09:39   DG Chest Port 1 View  Result Date: 07/04/2019 CLINICAL DATA:  Chest tube placement EXAM: PORTABLE CHEST 1 VIEW COMPARISON:  07/11/2019 at 1207 hours FINDINGS: Interval placement  of right pigtail pleural drainage catheter. Re-expansion of the right lung with no discernible residual right-sided pneumothorax. Similar prominent interstitial markings bilaterally without new airspace consolidation. Stable cardiomediastinal contours. Extensive bilateral chest wall subcutaneous emphysema, right greater than left. IMPRESSION: 1. Interval placement of right chest tube with re-expansion of the right lung. No discernible residual right-sided pneumothorax. 2. Extensive bilateral chest wall subcutaneous emphysema, right greater than left. Electronically Signed   By: Davina Poke D.O.   On: 06/28/2019 16:08   DG Chest Portable 1 View  Result Date: 06/19/2019 CLINICAL DATA:  Recent right pneumothorax. Patient left AMA after pulling tube. New onset shortness of breath. EXAM: PORTABLE CHEST 1 VIEW COMPARISON:  06/19/2019.  CT 06/19/2019. FINDINGS: Near complete right pneumothorax is noted. Near complete atelectasis of the right lung. Diffuse interstitial prominence left lung. Heart size stable. Diffuse bilateral chest wall subcutaneous emphysema is again noted. IMPRESSION: Near complete right pneumothorax with atelectasis of the right lung.  Critical Value/emergent results were called by telephone at the time of interpretation on 07/08/2019 at 12:39 pm to provider [facility] , who verbally acknowledged these results. Electronically Signed   By: Marcello Moores  Register   On: 07/04/2019 12:42   ECHOCARDIOGRAM COMPLETE  Result Date: 06/21/2019    ECHOCARDIOGRAM REPORT   Patient Name:   Benjamin Carlson Date of Exam: 06/21/2019 Medical Rec #:  [id number]    Height:       69.0 in Accession #:    [id number]   Weight:       175.0 lb Date of Birth:  27-Jun-1945    BSA:          1.952 m Patient Age:    74 years     BP:           126/47 mmHg Patient Gender: M            HR:           92 bpm. Exam Location:  Inpatient Procedure: 2D Echo Indications:    atrial fibrillation 427.31  History:        Patient has no prior history of Echocardiogram examinations.                 Risk Factors:Former Smoker.  Sonographer:    Johny Chess Referring Phys: [id number] [place]  1. Left ventricular ejection fraction, by estimation, is 45 to 50%. The left ventricle has mildly decreased function. The left ventricle demonstrates global hypokinesis. Left ventricular diastolic parameters were normal.  2. Right ventricular systolic function is normal. The right ventricular size is normal. There is normal pulmonary artery systolic pressure. The estimated right ventricular systolic pressure is 50.0 mmHg.  3. Left atrial size was mildly dilated.  4. The mitral valve is normal in structure. Mild to moderate mitral valve regurgitation. No evidence of mitral stenosis.  5. The aortic valve is normal in structure. Aortic valve regurgitation is not visualized. No aortic stenosis is present.  6. The inferior vena cava is normal in size with greater than 50% respiratory variability, suggesting right atrial pressure of 3 mmHg.  7. Poor acoustical images limit accurate assessment of EF and wall motion. Recommend repeat limited study with definity contrast. FINDINGS  Left Ventricle:  Left ventricular ejection fraction, by estimation, is 45 to 50%. The left ventricle has mildly decreased function. The left ventricle demonstrates global hypokinesis. The left ventricular internal cavity size was normal in size. There is  no left ventricular hypertrophy. Left ventricular diastolic  parameters were normal. Normal left ventricular filling pressure. Right Ventricle: The right ventricular size is normal. No increase in right ventricular wall thickness. Right ventricular systolic function is normal. There is normal pulmonary artery systolic pressure. The tricuspid regurgitant velocity is 2.51 m/s, and  with an assumed right atrial pressure of 3 mmHg, the estimated right ventricular systolic pressure is 28.4 mmHg. Left Atrium: Left atrial size was mildly dilated. Right Atrium: Right atrial size was normal in size. Pericardium: There is no evidence of pericardial effusion. Mitral Valve: The mitral valve is normal in structure. Normal mobility of the mitral valve leaflets. Mild to moderate mitral valve regurgitation. No evidence of mitral valve stenosis. Tricuspid Valve: The tricuspid valve is normal in structure. Tricuspid valve regurgitation is mild . No evidence of tricuspid stenosis. Aortic Valve: The aortic valve is normal in structure. Aortic valve regurgitation is not visualized. No aortic stenosis is present. Pulmonic Valve: The pulmonic valve was normal in structure. Pulmonic valve regurgitation is not visualized. No evidence of pulmonic stenosis. Aorta: The aortic root is normal in size and structure. Venous: The inferior vena cava is normal in size with greater than 50% respiratory variability, suggesting right atrial pressure of 3 mmHg. IAS/Shunts: The interatrial septum appears to be lipomatous. No atrial level shunt detected by color flow Doppler.  LEFT VENTRICLE PLAX 2D LVIDd:         4.90 cm  Diastology LVIDs:         3.80 cm  LV e' lateral:   12.80 cm/s LV PW:         1.00 cm  LV E/e'  lateral: 7.6 LV IVS:        0.80 cm  LV e' medial:    8.49 cm/s LVOT diam:     1.70 cm  LV E/e' medial:  11.4 LV SV:         51 LV SV Index:   26 LVOT Area:     2.27 cm  RIGHT VENTRICLE             IVC RV S prime:     14.70 cm/s  IVC diam: 1.80 cm TAPSE (M-mode): 2.0 cm LEFT ATRIUM             Index       RIGHT ATRIUM           Index LA diam:        3.30 cm 1.69 cm/m  RA Area:     11.40 cm LA Vol (A2C):   81.4 ml 41.70 ml/m RA Volume:   26.40 ml  13.52 ml/m LA Vol (A4C):   50.3 ml 25.77 ml/m LA Biplane Vol: 67.2 ml 34.42 ml/m  AORTIC VALVE LVOT Vmax:   115.00 cm/s LVOT Vmean:  78.600 cm/s LVOT VTI:    0.223 m MITRAL VALVE               TRICUSPID VALVE MV Area (PHT): 4.36 cm    TR Peak grad:   25.2 mmHg MV Decel Time: 174 msec    TR Vmax:        251.00 cm/s MV E velocity: 96.70 cm/s MV A velocity: 92.00 cm/s  SHUNTS MV E/A ratio:  1.05        Systemic VTI:  0.22 m                            Systemic Diam: 1.70 cm Fransico Him MD Electronically  signed by Fransico Him MD Signature Date/Time: 06/21/2019/4:35:07 PM    Final     Cardiac Studies   Echo EF 45% mildly reduced.  Patient Profile     74 y.o. male with the following issues:  Assessment & Plan    Paroxysmal atrial fibrillation -Currently on telemetry, after review I do not see any evidence of A. fib this morning, he does have sinus tachycardia with PACs at times. -Regardless, no anticoagulation. -Beta-blocker increased yesterday to 50, I will increase to 100 twice a day.  Hyperlipidemia -Continue with Crestor  Alcohol use -Protocol in place to watch for withdrawal, currently sleeping  Tobacco use -Continue to encourage cessation  Pneumothorax -Per surgical team -Fever noted.      For questions or updates, please contact [place] Please consult [email or web address] for contact info under        Signed, Candee Furbish, MD  06/22/2019, 10:56 AM

## 2019-06-22 NOTE — Progress Notes (Signed)
Chest x-ray reviewed Stable, minimal pneumothorax Extensive airspace disease, on azithromycin and Rocephin  Continue lines of care

## 2019-06-22 NOTE — Progress Notes (Addendum)
   NAME:  Benjamin Carlson, MRN:  [id number], DOB:  Nov 11, 1945, LOS: 2 ADMISSION DATE:  07/04/2019, CONSULTATION DATE:  06/21/2019 REFERRING MD:  Trauma attending, CHIEF COMPLAINT:  Pneumothorax, wheezing   Brief History   Patient had had a fall and developed some chest discomfort, went to [facility] where it was discovered that he had a 50% pneumothorax, chest tube was placed. Patient signed out AMA, removed his chest tube. Was seen 06/19/2019 at [facility], ED with shortness of breath noted to have crepitus on his chest wall.  Trauma service placed chest tube. Noted to have atrial fibrillation which is being managed   Past Medical History   Past Medical History:  Diagnosis Date  . Arthritis   . Elevated cholesterol 05/17/2019   just diagnosed last month, on med for one moth     Significant Hospital Events   Chest tube placement 6/4 Chest tube placement 6/3  Consults:  pccm  Procedures:  6/4 tube placement  Significant Diagnostic Tests:  CT scan of the chest 6/3 IMPRESSION: Persistent small RIGHT pneumothorax despite pigtail thoracostomy tube.  Extensive chest wall, mediastinal, and cervical pneumatosis, greater on RIGHT.  Scattered subpleural blebs especially at apices with small focus of ground-glass infiltrate in anterior LEFT upper lobe/lingula.  Atherosclerotic calcifications including coronary arteries.  Chest x-ray 6/5 shows extensive subcu emphysema, no pneumothorax appreciated  Head CT 6/5 IMPRESSION: No evidence of acute intracranial abnormality.  Chronic small vessel ischemic changes within the cerebral white matter.  Prominent multifocal subcutaneous gas within the visualized upper neck and maxillofacial soft tissues.  Paranasal sinus mucosal thickening. Micro Data:  MRSA PCR negative  Antimicrobials:  Rocephin 6/5>> Azithromycin 6/5>>  Interim history/subjective:  Agitation overnight More interactive this morning still falls asleep  easily He did have a T-max of 102.6 during the night  Objective   Blood pressure (!) 117/49, pulse 93, temperature 99 F (37.2 C), temperature source Oral, resp. rate 17, SpO2 96 %.        Intake/Output Summary (Last 24 hours) at 06/22/2019 0809 Last data filed at 06/22/2019 0558 Gross per 24 hour  Intake 1713.65 ml  Output 965 ml  Net 748.65 ml   There were no vitals filed for this visit.  Examination: General: Elderly gentleman, does not appear to be in respiratory distress HENT: Moist oral mucosa Lungs: Rhonchi bilaterally Cardiovascular: S1-S2 appreciated Abdomen: Soft, bowel sounds appreciated Extremities: No clubbing, no edema Neuro: Arouses, moves all extremities, follows one-step commands  Resolved Hospital Problem list     Assessment & Plan:  Pneumothorax secondary to trauma -Chest tube in place with stable lungs, still has subcutaneous emphysema but improved -Does not appear to have an active leak at present  Chronic obstructive pulmonary disease with exacerbation -Emphysema on CT scan of the chest, multiple blebs noted on CT -Still smoking actively recently -Smoking cessation counseling will be appropriate when patient is able to be counseled -On bronchodilator treatments -Pulmicort nebulization added  Possible pneumonia -Started on azithromycin and Rocephin for community-acquired pneumonia -New fever is likely related to lower respiratory tract infection  Fever 102.6 during the night -Likely related to pneumonia -Obtain blood cultures  Atrial fibrillation -On beta-blockers  Encephalopathy Arterial blood gases that was performed 06/21/2019 was within normal limits  Obtain labs in a.m. Follow chest x-ray  Will continue to follow  Sherrilyn Rist, MD [place] [facility] Pager: [phone]

## 2019-06-22 NOTE — Progress Notes (Signed)
0030-Dr.Byerly aware patient is back in Afib. Also made aware of patients temp of 102.6 earlier. Discussed patients respiratory status, on 15 L HF, with SOB when awake and agitated. 62- Patient agitated, trying to get OOB, will not calm to emotional support and verbal command. Patient confused to place, time and situation, unable to reorient. Patient medicated for agitation per [facility].  0315- Patient trying to get OOB, confused, unable to reorient patient. Patient SOB with audible wheezes, call to RT for PRN respiratory TX. Asked patient if he is in pain, patient states yes, wont say where his pain his and will not describe his pain, medicated per [facility] for pain.

## 2019-06-23 ENCOUNTER — Inpatient Hospital Stay (HOSPITAL_COMMUNITY): Payer: Medicare HMO

## 2019-06-23 DIAGNOSIS — E440 Moderate protein-calorie malnutrition: Secondary | ICD-10-CM | POA: Insufficient documentation

## 2019-06-23 DIAGNOSIS — E785 Hyperlipidemia, unspecified: Secondary | ICD-10-CM

## 2019-06-23 DIAGNOSIS — R9431 Abnormal electrocardiogram [ECG] [EKG]: Secondary | ICD-10-CM

## 2019-06-23 LAB — CBC WITH DIFFERENTIAL/PLATELET
Abs Immature Granulocytes: 0.13 10*3/uL — ABNORMAL HIGH (ref 0.00–0.07)
Basophils Absolute: 0 10*3/uL (ref 0.0–0.1)
Basophils Relative: 0 %
Eosinophils Absolute: 0.3 10*3/uL (ref 0.0–0.5)
Eosinophils Relative: 2 %
HCT: 33.6 % — ABNORMAL LOW (ref 39.0–52.0)
Hemoglobin: 10.9 g/dL — ABNORMAL LOW (ref 13.0–17.0)
Immature Granulocytes: 1 %
Lymphocytes Relative: 9 %
Lymphs Abs: 1.1 10*3/uL (ref 0.7–4.0)
MCH: 31.7 pg (ref 26.0–34.0)
MCHC: 32.4 g/dL (ref 30.0–36.0)
MCV: 97.7 fL (ref 80.0–100.0)
Monocytes Absolute: 0.5 10*3/uL (ref 0.1–1.0)
Monocytes Relative: 4 %
Neutro Abs: 9.6 10*3/uL — ABNORMAL HIGH (ref 1.7–7.7)
Neutrophils Relative %: 84 %
Platelets: 152 10*3/uL (ref 150–400)
RBC: 3.44 MIL/uL — ABNORMAL LOW (ref 4.22–5.81)
RDW: 13.1 % (ref 11.5–15.5)
WBC: 11.6 10*3/uL — ABNORMAL HIGH (ref 4.0–10.5)
nRBC: 0 % (ref 0.0–0.2)

## 2019-06-23 LAB — URINALYSIS, ROUTINE W REFLEX MICROSCOPIC
Bilirubin Urine: NEGATIVE
Glucose, UA: 50 mg/dL — AB
Ketones, ur: NEGATIVE mg/dL
Leukocytes,Ua: NEGATIVE
Nitrite: NEGATIVE
Protein, ur: 100 mg/dL — AB
Specific Gravity, Urine: 1.023 (ref 1.005–1.030)
pH: 5 (ref 5.0–8.0)

## 2019-06-23 LAB — POCT I-STAT 7, (LYTES, BLD GAS, ICA,H+H)
Acid-Base Excess: 6 mmol/L — ABNORMAL HIGH (ref 0.0–2.0)
Bicarbonate: 32 mmol/L — ABNORMAL HIGH (ref 20.0–28.0)
Calcium, Ion: 1.21 mmol/L (ref 1.15–1.40)
HCT: 28 % — ABNORMAL LOW (ref 39.0–52.0)
Hemoglobin: 9.5 g/dL — ABNORMAL LOW (ref 13.0–17.0)
O2 Saturation: 100 %
Potassium: 3.3 mmol/L — ABNORMAL LOW (ref 3.5–5.1)
Sodium: 141 mmol/L (ref 135–145)
TCO2: 34 mmol/L — ABNORMAL HIGH (ref 22–32)
pCO2 arterial: 55.5 mmHg — ABNORMAL HIGH (ref 32.0–48.0)
pH, Arterial: 7.369 (ref 7.350–7.450)
pO2, Arterial: 424 mmHg — ABNORMAL HIGH (ref 83.0–108.0)

## 2019-06-23 LAB — BASIC METABOLIC PANEL
Anion gap: 11 (ref 5–15)
BUN: 14 mg/dL (ref 8–23)
CO2: 24 mmol/L (ref 22–32)
Calcium: 8.2 mg/dL — ABNORMAL LOW (ref 8.9–10.3)
Chloride: 106 mmol/L (ref 98–111)
Creatinine, Ser: 0.81 mg/dL (ref 0.61–1.24)
GFR calc Af Amer: >60 mL/min (ref >60)
GFR calc non Af Amer: >60 mL/min (ref >60)
Glucose, Bld: 120 mg/dL — ABNORMAL HIGH (ref 70–99)
Potassium: 3.5 mmol/L (ref 3.5–5.1)
Sodium: 141 mmol/L (ref 135–145)

## 2019-06-23 LAB — GLUCOSE, CAPILLARY
Glucose-Capillary: 153 mg/dL — ABNORMAL HIGH (ref 70–99)
Glucose-Capillary: 165 mg/dL — ABNORMAL HIGH (ref 70–99)
Glucose-Capillary: 171 mg/dL — ABNORMAL HIGH (ref 70–99)
Glucose-Capillary: 188 mg/dL — ABNORMAL HIGH (ref 70–99)

## 2019-06-23 LAB — PHOSPHORUS: Phosphorus: 2.6 mg/dL (ref 2.5–4.6)

## 2019-06-23 LAB — LIPID PANEL
Cholesterol: 76 mg/dL (ref 0–200)
HDL: 25 mg/dL — ABNORMAL LOW (ref >40)
LDL Cholesterol: 35 mg/dL (ref 0–99)
Total CHOL/HDL Ratio: 3 RATIO
Triglycerides: 78 mg/dL (ref <150)
VLDL: 16 mg/dL (ref 0–40)

## 2019-06-23 LAB — BRAIN NATRIURETIC PEPTIDE: B Natriuretic Peptide: 416.1 pg/mL — ABNORMAL HIGH (ref 0.0–100.0)

## 2019-06-23 LAB — ECHOCARDIOGRAM LIMITED: Height: 74 in

## 2019-06-23 LAB — MAGNESIUM: Magnesium: 1.9 mg/dL (ref 1.7–2.4)

## 2019-06-23 MED ORDER — CHLORHEXIDINE GLUCONATE CLOTH 2 % EX PADS
6.0000 | MEDICATED_PAD | Freq: Every day | CUTANEOUS | Status: DC
  Administered 2019-06-23 – 2019-06-26 (×3): 6 via TOPICAL

## 2019-06-23 MED ORDER — LACTATED RINGERS IV BOLUS
500.0000 mL | Freq: Once | INTRAVENOUS | Status: AC
  Administered 2019-06-23: 500 mL via INTRAVENOUS

## 2019-06-23 MED ORDER — PERFLUTREN LIPID MICROSPHERE
1.0000 mL | INTRAVENOUS | Status: AC | PRN
  Administered 2019-06-23: 2 mL via INTRAVENOUS
  Filled 2019-06-23: qty 10

## 2019-06-23 MED ORDER — NOREPINEPHRINE 4 MG/250ML-% IV SOLN
INTRAVENOUS | Status: AC
  Administered 2019-06-23: 2 ug/min via INTRAVENOUS
  Filled 2019-06-23: qty 250

## 2019-06-23 MED ORDER — CHLORHEXIDINE GLUCONATE 0.12% ORAL RINSE (MEDLINE KIT)
15.0000 mL | Freq: Two times a day (BID) | OROMUCOSAL | Status: DC
  Administered 2019-06-23 – 2019-06-29 (×12): 15 mL via OROMUCOSAL

## 2019-06-23 MED ORDER — OXYCODONE HCL 5 MG/5ML PO SOLN
5.0000 mg | ORAL | Status: DC | PRN
  Administered 2019-06-25 – 2019-06-29 (×5): 10 mg
  Filled 2019-06-23 (×6): qty 10

## 2019-06-23 MED ORDER — ACETAMINOPHEN 10 MG/ML IV SOLN
1000.0000 mg | Freq: Once | INTRAVENOUS | Status: AC
  Administered 2019-06-23: 1000 mg via INTRAVENOUS
  Filled 2019-06-23: qty 100

## 2019-06-23 MED ORDER — AMIODARONE HCL IN DEXTROSE 360-4.14 MG/200ML-% IV SOLN: 60.0000 mg/h | INTRAVENOUS | Status: DC

## 2019-06-23 MED ORDER — ENOXAPARIN SODIUM 30 MG/0.3ML ~~LOC~~ SOLN
30.0000 mg | Freq: Two times a day (BID) | SUBCUTANEOUS | Status: DC
  Administered 2019-06-23 – 2019-06-28 (×11): 30 mg via SUBCUTANEOUS
  Filled 2019-06-23 (×11): qty 0.3

## 2019-06-23 MED ORDER — METOPROLOL TARTRATE 25 MG PO TABS: 25.0000 mg | ORAL_TABLET | Freq: Two times a day (BID) | ORAL | Status: DC

## 2019-06-23 MED ORDER — AMIODARONE HCL IN DEXTROSE 360-4.14 MG/200ML-% IV SOLN: 30.0000 mg/h | INTRAVENOUS | Status: DC

## 2019-06-23 MED ORDER — NOREPINEPHRINE 4 MG/250ML-% IV SOLN
0.0000 ug/min | INTRAVENOUS | Status: DC
  Administered 2019-06-24: 14 ug/min via INTRAVENOUS
  Filled 2019-06-23 (×4): qty 250

## 2019-06-23 MED ORDER — SODIUM CHLORIDE 0.9 % IV SOLN: INTRAVENOUS | Status: DC | PRN

## 2019-06-23 MED ORDER — ACETAMINOPHEN 650 MG RE SUPP: 650.0000 mg | RECTAL | Status: DC | PRN

## 2019-06-23 MED ORDER — PIVOT 1.5 CAL PO LIQD
1000.0000 mL | ORAL | Status: DC
  Administered 2019-06-23 – 2019-06-27 (×5): 1000 mL

## 2019-06-23 MED ORDER — ADULT MULTIVITAMIN W/MINERALS CH
1.0000 | ORAL_TABLET | Freq: Every day | ORAL | Status: DC
  Administered 2019-06-24 – 2019-06-28 (×5): 1
  Filled 2019-06-23 (×5): qty 1

## 2019-06-23 MED ORDER — SUCCINYLCHOLINE CHLORIDE 20 MG/ML IJ SOLN
100.0000 mg | Freq: Once | INTRAMUSCULAR | Status: AC
  Administered 2019-06-23: 100 mg via INTRAVENOUS

## 2019-06-23 MED ORDER — FENTANYL 2500MCG IN NS 250ML (10MCG/ML) PREMIX INFUSION
0.0000 ug/h | INTRAVENOUS | Status: DC
  Administered 2019-06-23: 100 ug/h via INTRAVENOUS
  Administered 2019-06-24: 50 ug/h via INTRAVENOUS
  Administered 2019-06-26: 200 ug/h via INTRAVENOUS
  Administered 2019-06-26: 175 ug/h via INTRAVENOUS
  Administered 2019-06-27: 125 ug/h via INTRAVENOUS
  Administered 2019-06-28: 200 ug/h via INTRAVENOUS
  Administered 2019-06-28: 125 ug/h via INTRAVENOUS
  Filled 2019-06-23 (×7): qty 250

## 2019-06-23 MED ORDER — AMIODARONE HCL IN DEXTROSE 360-4.14 MG/200ML-% IV SOLN
60.0000 mg/h | INTRAVENOUS | Status: DC
  Administered 2019-06-23 – 2019-06-26 (×9): 60 mg/h via INTRAVENOUS
  Filled 2019-06-23 (×12): qty 200

## 2019-06-23 MED ORDER — AMIODARONE LOAD VIA INFUSION
150.0000 mg | Freq: Once | INTRAVENOUS | Status: DC
  Filled 2019-06-23: qty 83.34

## 2019-06-23 MED ORDER — ORAL CARE MOUTH RINSE
15.0000 mL | OROMUCOSAL | Status: DC
  Administered 2019-06-23 – 2019-06-29 (×57): 15 mL via OROMUCOSAL

## 2019-06-23 MED ORDER — AMIODARONE HCL IN DEXTROSE 360-4.14 MG/200ML-% IV SOLN
60.0000 mg/h | INTRAVENOUS | Status: DC
  Administered 2019-06-23: 60 mg/h via INTRAVENOUS
  Filled 2019-06-23: qty 200

## 2019-06-23 MED ORDER — ETOMIDATE 2 MG/ML IV SOLN
25.0000 mg | Freq: Once | INTRAVENOUS | Status: AC
  Administered 2019-06-23: 20 mg via INTRAVENOUS

## 2019-06-23 MED ORDER — DOCUSATE SODIUM 50 MG/5ML PO LIQD
100.0000 mg | Freq: Two times a day (BID) | ORAL | Status: DC
  Administered 2019-06-23 – 2019-06-28 (×12): 100 mg
  Filled 2019-06-23 (×12): qty 10

## 2019-06-23 MED ORDER — FOLIC ACID 1 MG PO TABS
1.0000 mg | ORAL_TABLET | Freq: Every day | ORAL | Status: DC
  Administered 2019-06-24 – 2019-06-28 (×5): 1 mg
  Filled 2019-06-23 (×6): qty 1

## 2019-06-23 MED ORDER — METOPROLOL TARTRATE 5 MG/5ML IV SOLN
2.5000 mg | Freq: Four times a day (QID) | INTRAVENOUS | Status: DC
  Filled 2019-06-23: qty 5

## 2019-06-23 MED ORDER — PERFLUTREN LIPID MICROSPHERE
INTRAVENOUS | Status: AC
  Filled 2019-06-23: qty 10

## 2019-06-23 NOTE — Progress Notes (Signed)
Still on floor when patient noted to have sats in the 60s on [place] and RR in the 40-50s and HR increased to a fib with RVR at 175bpm.  Patient remains lethargic.  NRB placed and RR down to 30s and sats increase to high 90s-100.  Dr. Bobbye Morton came up and we decided the patient needed to be intubated for airway protection as well as because of his acute pulmonary issues.  See her note for description of procedure.  Daughter at bedside and updated.  Henreitta Cea 10:26 AM 06/23/2019

## 2019-06-23 NOTE — Procedures (Signed)
   Procedure Note  Date: 06/23/2019  Procedure: central venous catheter placement--right, internal jugular vein, with ultrasound guidance  Pre-op diagnosis: hypotension, need for administration of vasopressors Post-op diagnosis: same  Surgeon: Jesusita Oka, MD  Anesthesia: local  EBL: <5cc Drains/Implants: triple lumen central venous catheter  Description of procedure: Time-out was performed verifying correct patient, procedure, site, laterality, and signature of informed consent.  The right neck was prepped and draped in the usual sterile fashion. The internal jugular vein was localized with ultrasound guidance. Five ccs of local anesthetic was infiltrated at the site of venous access. The internal jugular vein was accessed using an introducer needle and a guidewire passed through the needle. The needle was removed and a skin nick was made. The tract was dilated and the central venous catheter advanced over the guidewire followed by removal of the guidewire. All ports drew blood easily and all were flushed with saline. The catheter was secured to the skin with suture and a sterile dressing. The patient tolerated the procedure well. There were no immediate complications. Follow up chest x-ray was ordered to confirm positioning and the absence of a pneumothorax.   Jesusita Oka, MD General and [facility]    Procedure Note  Date: 06/23/2019  Procedure: arterial line placement--right, radial artery, without ultrasound guidance  Pre-op diagnosis: hypotension, need for invasive blood pressure monitoring Post-op diagnosis: same  Surgeon: Jesusita Oka, MD  Anesthesia: none  EBL: <5cc Drains/Implants: 4 cm arterial catheter  Description of procedure: Time-out was performed verifying correct patient, procedure, site, laterality, and signature of informed consent.  The right wrist was prepped and draped in the usual sterile fashion. The artery was  localized using anatomic landmarks and accessed using an arterial catheterization kit after 1 attempt(s). The wire was advanced and the sheath advanced over the wire. The needle and wire were removed with pulsatile, bright red blood noted through the catheter. The catheter was connected to a transducer and a good waveform was noted. The catheter was secured in place with suture and tegaderm. The patient tolerated the procedure well. There were no complications.    Jesusita Oka, MD General and [facility]

## 2019-06-23 NOTE — Progress Notes (Addendum)
Patient ID: Benjamin Carlson, male   DOB: 05/20/1945, 74 y.o.   MRN: [id number]       Subjective: Patient is lethargic, can barely try to open his eyes.  He was fed breakfast and ate some today.  Tech says this is better than yesterday.  Audible coarse respiratory sounds while standing at bedside.  Patient is confused.  ROS: unable due to lethargy and AMS  Objective: Vital signs in last 24 hours: Temp:  [97.1 F (36.2 C)-100.8 F (38.2 C)] 97.5 F (36.4 C) (06/07 0700) Pulse Rate:  [89-110] 97 (06/07 0635) Resp:  [23-37] 23 (06/07 0635) BP: (100-154)/(45-60) 100/45 (06/07 0700) SpO2:  [95 %-100 %] 96 % (06/07 0917) Last BM Date: 06/18/19  Intake/Output from previous day: 06/06 0701 - 06/07 0700 In: 2643.1 [P.O.:420; I.V.:1374.1; IV Piggyback:849] Out: 0165 [Urine:1400; Chest Tube:36] Intake/Output this shift: Total I/O In: 120 [P.O.:120] Out: 218 [Urine:200; Chest Tube:18]  PE: Gen: lethargic. Mumbles when trying to talk HEENT: eyelids lifted myself as he is unable to open eyes on his own.  PERRL Heart: regular, but tachy Lungs: diffuse rhonchi, coarse BS throughout.  Tries to cough.  CT in place on right side, no airleak today, on suction.  Minimal serous output noted Abd: soft, NT, ND GU: condom cath in place with clear yellow urine Ext: MAE spontaneously, mittens in place on B hands Psych: AMS  Lab Results:  Recent Labs    06/21/19 0538 06/23/19 0644  WBC 12.0* 11.6*  HGB 12.1* 10.9*  HCT 36.1* 33.6*  PLT 173 152   BMET Recent Labs    06/21/19 0538 06/23/19 0644  NA 136 141  K 3.5 3.5  CL 100 106  CO2 25 24  GLUCOSE 132* 120*  BUN 14 14  CREATININE 0.86 0.81  CALCIUM 8.3* 8.2*   PT/INR Recent Labs    06/21/2019 1144  LABPROT 13.2  INR 1.0   CMP     Component Value Date/Time   NA 141 06/23/2019 0644   K 3.5 06/23/2019 0644   CL 106 06/23/2019 0644   CO2 24 06/23/2019 0644   GLUCOSE 120 (H) 06/23/2019 0644   BUN 14 06/23/2019 0644   CREATININE  0.81 06/23/2019 0644   CALCIUM 8.2 (L) 06/23/2019 0644   PROT 6.5 07/07/2019 1543   ALBUMIN 3.0 (L) 07/03/2019 1543   AST 23 07/03/2019 1543   ALT 14 07/10/2019 1543   ALKPHOS 64 07/16/2019 1543   BILITOT 0.9 07/10/2019 1543   GFRNONAA >60 06/23/2019 0644   GFRAA >60 06/23/2019 0644   Lipase  No results found for: LIPASE     Studies/Results: CT HEAD WO CONTRAST  Result Date: 06/21/2019 CLINICAL DATA:  Encephalopathy.  Additional provided: Pneumothorax EXAM: CT HEAD WITHOUT CONTRAST TECHNIQUE: Contiguous axial images were obtained from the base of the skull through the vertex without intravenous contrast. COMPARISON:  No pertinent prior studies available for comparison. FINDINGS: Brain: Ill-defined hypoattenuation within the cerebral white is nonspecific, but consistent with chronic small vessel ischemic disease. There is no acute intracranial hemorrhage. No demarcated cortical infarct. No extra-axial fluid collection. No evidence of intracranial mass. No midline shift. Vascular: No hyperdense vessel.  Atherosclerotic calcifications Skull: Normal. Negative for fracture or focal lesion. Sinuses/Orbits: Visualized orbits show no acute finding. Paranasal sinus mucosal thickening greatest within the left frontal sinus and bilateral ethmoid air cells. No significant mastoid effusion. Other: There is prominent multifocal subcutaneous gas within the visualized upper neck and maxillofacial soft tissues. IMPRESSION: No evidence  of acute intracranial abnormality. Chronic small vessel ischemic changes within the cerebral white matter. Prominent multifocal subcutaneous gas within the visualized upper neck and maxillofacial soft tissues. Paranasal sinus mucosal thickening. Electronically Signed   By: Kellie Simmering DO   On: 06/21/2019 17:33   DG Chest Port 1 View  Result Date: 06/23/2019 CLINICAL DATA:  Cough, chest tube EXAM: PORTABLE CHEST 1 VIEW COMPARISON:  Radiograph 06/25/2019, CT 06/19/2019 FINDINGS:  Right apically directed pigtail pleural drain is in similar position to comparison accounting for differences in technique. Small amount of residual pleural gas is seen towards the right lung apex. Increasing heterogeneous airspace opacity throughout both lungs which is markedly increased from the CT exam but grossly similar to more recent comparison radiography. Minimal residual pneumomediastinum, certainly less conspicuous than comparison exams. Remaining cardiomediastinal contours are unchanged. Improving subcutaneous emphysema. Degenerative changes are present in the imaged spine and shoulders. Telemetry leads overlie the chest. IMPRESSION: 1. Stable position of right apically directed pleural drain. Small amount of residual pleural gas towards the right lung apex. 2. Persistent heterogeneous airspace opacity throughout both lungs which is markedly increased from the CT exam but grossly similar to more recent comparison radiography. 3. Extensive subcutaneous emphysema and pneumomediastinum, improving from comparison. Electronically Signed   By: Lovena Le M.D.   On: 06/23/2019 06:00   DG CHEST PORT 1 VIEW  Result Date: 06/22/2019 CLINICAL DATA:  Congested/ shob Patient had had a fall and developed some chest discomfort, went to [facility] where it was discovered that he had a 50% pneumothorax, chest tube was placed. EXAM: PORTABLE CHEST - 1 VIEW COMPARISON:  the previous day's study FINDINGS: Right pigtail chest tube remains directed laterally towards the apex. There is a trace lateral pneumothorax. Moderate right lateral subcutaneous emphysema slightly improved. Extensive patchy airspace infiltrates throughout both lungs with relative sparing at the periphery of the bases. Heart size and mediastinal contours are within normal limits. No effusion. Visualized bones unremarkable. IMPRESSION: 1. Stable right chest tube with trace pneumothorax. 2. Persistent bilateral airspace disease Electronically  Signed   By: Lucrezia Europe M.D.   On: 06/22/2019 10:44   ECHOCARDIOGRAM COMPLETE  Result Date: 06/21/2019    ECHOCARDIOGRAM REPORT   Patient Name:   Benjamin Carlson Date of Exam: 06/21/2019 Medical Rec #:  [id number]    Height:       69.0 in Accession #:    [id number]   Weight:       175.0 lb Date of Birth:  Aug 01, 1945    BSA:          1.952 m Patient Age:    21 years     BP:           126/47 mmHg Patient Gender: M            HR:           92 bpm. Exam Location:  Inpatient Procedure: 2D Echo Indications:    atrial fibrillation 427.31  History:        Patient has no prior history of Echocardiogram examinations.                 Risk Factors:Former Smoker.  Sonographer:    Johny Chess Referring Phys: [id number] [place]  1. Left ventricular ejection fraction, by estimation, is 45 to 50%. The left ventricle has mildly decreased function. The left ventricle demonstrates global hypokinesis. Left ventricular diastolic parameters were normal.  2. Right ventricular systolic function  is normal. The right ventricular size is normal. There is normal pulmonary artery systolic pressure. The estimated right ventricular systolic pressure is 65.9 mmHg.  3. Left atrial size was mildly dilated.  4. The mitral valve is normal in structure. Mild to moderate mitral valve regurgitation. No evidence of mitral stenosis.  5. The aortic valve is normal in structure. Aortic valve regurgitation is not visualized. No aortic stenosis is present.  6. The inferior vena cava is normal in size with greater than 50% respiratory variability, suggesting right atrial pressure of 3 mmHg.  7. Poor acoustical images limit accurate assessment of EF and wall motion. Recommend repeat limited study with definity contrast. FINDINGS  Left Ventricle: Left ventricular ejection fraction, by estimation, is 45 to 50%. The left ventricle has mildly decreased function. The left ventricle demonstrates global hypokinesis. The left ventricular internal  cavity size was normal in size. There is  no left ventricular hypertrophy. Left ventricular diastolic parameters were normal. Normal left ventricular filling pressure. Right Ventricle: The right ventricular size is normal. No increase in right ventricular wall thickness. Right ventricular systolic function is normal. There is normal pulmonary artery systolic pressure. The tricuspid regurgitant velocity is 2.51 m/s, and  with an assumed right atrial pressure of 3 mmHg, the estimated right ventricular systolic pressure is 93.5 mmHg. Left Atrium: Left atrial size was mildly dilated. Right Atrium: Right atrial size was normal in size. Pericardium: There is no evidence of pericardial effusion. Mitral Valve: The mitral valve is normal in structure. Normal mobility of the mitral valve leaflets. Mild to moderate mitral valve regurgitation. No evidence of mitral valve stenosis. Tricuspid Valve: The tricuspid valve is normal in structure. Tricuspid valve regurgitation is mild . No evidence of tricuspid stenosis. Aortic Valve: The aortic valve is normal in structure. Aortic valve regurgitation is not visualized. No aortic stenosis is present. Pulmonic Valve: The pulmonic valve was normal in structure. Pulmonic valve regurgitation is not visualized. No evidence of pulmonic stenosis. Aorta: The aortic root is normal in size and structure. Venous: The inferior vena cava is normal in size with greater than 50% respiratory variability, suggesting right atrial pressure of 3 mmHg. IAS/Shunts: The interatrial septum appears to be lipomatous. No atrial level shunt detected by color flow Doppler.  LEFT VENTRICLE PLAX 2D LVIDd:         4.90 cm  Diastology LVIDs:         3.80 cm  LV e' lateral:   12.80 cm/s LV PW:         1.00 cm  LV E/e' lateral: 7.6 LV IVS:        0.80 cm  LV e' medial:    8.49 cm/s LVOT diam:     1.70 cm  LV E/e' medial:  11.4 LV SV:         51 LV SV Index:   26 LVOT Area:     2.27 cm  RIGHT VENTRICLE             IVC  RV S prime:     14.70 cm/s  IVC diam: 1.80 cm TAPSE (M-mode): 2.0 cm LEFT ATRIUM             Index       RIGHT ATRIUM           Index LA diam:        3.30 cm 1.69 cm/m  RA Area:     11.40 cm LA Vol (A2C):   81.4 ml 41.70  ml/m RA Volume:   26.40 ml  13.52 ml/m LA Vol (A4C):   50.3 ml 25.77 ml/m LA Biplane Vol: 67.2 ml 34.42 ml/m  AORTIC VALVE LVOT Vmax:   115.00 cm/s LVOT Vmean:  78.600 cm/s LVOT VTI:    0.223 m MITRAL VALVE               TRICUSPID VALVE MV Area (PHT): 4.36 cm    TR Peak grad:   25.2 mmHg MV Decel Time: 174 msec    TR Vmax:        251.00 cm/s MV E velocity: 96.70 cm/s MV A velocity: 92.00 cm/s  SHUNTS MV E/A ratio:  1.05        Systemic VTI:  0.22 m                            Systemic Diam: 1.70 cm Fransico Him MD Electronically signed by Fransico Him MD Signature Date/Time: 06/21/2019/4:35:07 PM    Final     Anti-infectives: Anti-infectives (From admission, onward)   Start     Dose/Rate Route Frequency Ordered Stop   06/21/19 1600  azithromycin (ZITHROMAX) 500 mg in sodium chloride 0.9 % 250 mL IVPB     500 mg 250 mL/hr over 60 Minutes Intravenous Every 24 hours 06/21/19 1412     06/21/19 1500  cefTRIAXone (ROCEPHIN) 1 g in sodium chloride 0.9 % 100 mL IVPB     1 g 200 mL/hr over 30 Minutes Intravenous Every 24 hours 06/21/19 1412         Assessment/Plan ?Fall R PTX- s/p CT. Trace PNX on CXR, but lungs look horrible, no airleak today, continue CT to -20. IS/pulm toilet. A fib with RVR, new onset- cardiology following, no evidence of A fib this AM, BB 100mg  BID and will write for it IV as well incase he is unable to swallow pill secondary to his lethargy, no anticoagulation.  Agree with checking BNP.  Abnormal echo, new test ordered per cards HLD- resume home meds ETOH use- likely having some withdrawal, continue CIWA COPD - appreciate CCM assistance, lungs sound horrible this am with increase WOB.  Will await their evaluation, but concerned about his prognosis of his  respiratory status. Tobacco abuse Fever, PNA - started rocephin/azithromycin for CAP. Check Rcx Encephalopathy - head CT and abg WNL, may be 2/2 alcohol withdrawal. Per daughter has also been showing early signs of dementia  ID - azithromycin/ceftriaxone 6/5>> FEN - reg diet, as able, but will order speech eval VTE - SCDs, lovenox Foley - none Follow up - TBD   LOS: 3 days    Henreitta Cea , [facility] Surgery 06/23/2019, 9:35 AM Please see Amion for pager number during day hours 7:00am-4:30pm or 7:00am -11:30am on weekends

## 2019-06-23 NOTE — Progress Notes (Signed)
SLP Cancellation Note  Patient Details Name: Benjamin Carlson MRN: [id number] DOB: Aug 21, 1945   Cancelled treatment:       Reason Eval/Treat Not Completed: Medical issues which prohibited therapy. Swallow evaluation was ordered this morning but pt has now been intubated. Will f/u for readiness to complete swallow evaluation once he is extubated.     Osie Bond., M.A. [facility] Pager [phone] Office [phone]  06/23/2019, 10:48 AM

## 2019-06-23 NOTE — Progress Notes (Signed)
At the 8 am assessment patient could not tell me DOB, the year or where he was. Around 930 am telemetry called me and said his O2 levels were in the 70's and HR above 120's. Called respiratory because the 10 liters of nasal canula was not working and they suggested a non re-breather mask.  This implementation brought his O2 levels above 90%. He was shortly intubated and moved to the ICU

## 2019-06-23 NOTE — Progress Notes (Signed)
Patient no longer in AFIB, is NSR, and is on amiodarone 60/hr. Patient CBG 171 at this time. Dr. Bobbye Morton paged.

## 2019-06-23 NOTE — Progress Notes (Signed)
Initial Nutrition Assessment  DOCUMENTATION CODES:   Non-severe (moderate) malnutrition in context of chronic illness  INTERVENTION:   Initiate tube feeding via OG tube: Pivot 1.5 at 30 ml/h increasing by 10 ml every 8 hours to goal rate of 60 ml/h (1440 ml per day)  Provides 2160 kcal, 135 gm protein, 1092 ml free water daily  Monitor magnesium and phosphorus every 12 hours x 4 occurences, MD to replete as needed, as pt is at risk for refeeding syndrome given moderate malnutrition.  NUTRITION DIAGNOSIS:   Moderate Malnutrition related to chronic illness(COPD, ETOH) as evidenced by moderate muscle depletion, moderate fat depletion.  GOAL:   Provide needs based on ASPEN/SCCM guidelines  MONITOR:   TF tolerance, Labs, Vent status  REASON FOR ASSESSMENT:   Consult, Ventilator Enteral/tube feeding initiation and management  ASSESSMENT:   Pt with PMH of HLD and likely undiagnosed COPD with tobacco abuse, and ETOH abuse who was admitted after a possible fall with a R PTX s/p chest tube placement.    Pt discussed during ICU rounds and with RN.  Pt being followed by cardiology for new onset A fib with RVR.   6/7 lethargic this am with increased heart rate and low O2 sats; pt intubated  Patient is currently intubated on ventilator support MV: 10.1 L/min Temp (24hrs), Avg:99.1 F (37.3 C), Min:97.1 F (36.2 C), Max:100.8 F (38.2 C)  Medications reviewed and include: colace, folic acid, MVI, thiamine  Labs reviewed: K+ 3.3 (L)   Chest tube: 36 ml  NUTRITION - FOCUSED PHYSICAL EXAM:    Most Recent Value  Orbital Region  Moderate depletion  Upper Arm Region  Moderate depletion  Thoracic and Lumbar Region  Mild depletion  Buccal Region  Unable to assess  Temple Region  Moderate depletion  Clavicle Bone Region  Moderate depletion  Clavicle and Acromion Bone Region  Moderate depletion  Scapular Bone Region  Unable to assess  Dorsal Hand  No depletion  Patellar  Region  Mild depletion  Anterior Thigh Region  Mild depletion  Posterior Calf Region  Mild depletion  Edema (RD Assessment)  None  Hair  Reviewed  Eyes  Unable to assess  Mouth  Unable to assess  Skin  Reviewed  Nails  Reviewed       Diet Order:   Diet Order            Diet NPO time specified  Diet effective now              EDUCATION NEEDS:   No education needs have been identified at this time  Skin:  Skin Assessment: Reviewed RN Assessment  Last BM:  6/2  Height:   Ht Readings from Last 1 Encounters:  06/23/19 6\' 2"  (1.88 m)    Weight:   Wt Readings from Last 1 Encounters:  06/18/19 79.4 kg    Ideal Body Weight:  86.3 kg  BMI:  Body mass index is 22.47 kg/m.  Estimated Nutritional Needs:   Kcal:  2035  Protein:  120-140 gram s  Fluid:  2 L/day  Lockie Pares., RD, LDN, CNSC See AMiON for contact information

## 2019-06-23 NOTE — Progress Notes (Addendum)
Progress Note  Patient Name: Benjamin Carlson Date of Encounter: 06/23/2019  [facility] Cardiologist: Minus Breeding, MD   Subjective   Confused, decreased responsiveness compared to yesterday (according to staff), pt is wheezing, increased WOB, denies chest pain Has eaten very little today, has to be fed, not swallowing well.  Inpatient Medications    Scheduled Meds: . budesonide (PULMICORT) nebulizer solution  0.5 mg Nebulization BID  . docusate sodium  100 mg Oral BID  . enoxaparin (LOVENOX) injection  40 mg Subcutaneous Q24H  . folic acid  1 mg Oral Daily  . metoprolol tartrate  100 mg Oral BID  . multivitamin with minerals  1 tablet Oral Daily  . rosuvastatin  10 mg Oral Daily  . thiamine  100 mg Oral Daily   Or  . thiamine  100 mg Intravenous Daily   Continuous Infusions: . sodium chloride Stopped (06/22/19 1452)  . azithromycin 500 mg (06/22/19 1552)  . cefTRIAXone (ROCEPHIN)  IV 1 g (06/22/19 1451)   PRN Meds: acetaminophen, haloperidol lactate, ipratropium-albuterol, LORazepam **OR** LORazepam, metoprolol tartrate, morphine injection, ondansetron **OR** ondansetron (ZOFRAN) IV, oxyCODONE   Vital Signs    Vitals:   06/23/19 0126 06/23/19 0504 06/23/19 0635 06/23/19 0700  BP: (!) 108/50 (!) 121/52  (!) 100/45  Pulse: 94 89 97   Resp: (!) 26 (!) 25 (!) 23   Temp: 100 F (37.8 C) (!) 97.1 F (36.2 C) 97.6 F (36.4 C) (!) 97.5 F (36.4 C)  TempSrc: Axillary Axillary Axillary Oral  SpO2: 100% 100% 100%     Intake/Output Summary (Last 24 hours) at 06/23/2019 0850 Last data filed at 06/23/2019 0800 Gross per 24 hour  Intake 2523.1 ml  Output 1139 ml  Net 1384.1 ml   Last 3 Weights 06/18/2019 06/18/2019 02/17/2014  Weight (lbs) 175 lb 0.7 oz 170 lb 202 lb 1.6 oz  Weight (kg) 79.4 kg 77.111 kg 91.672 kg      Telemetry    SR, ST, 3 bt runs NSVT, PVCs - Do not see any atrial fibrillation- Personally Reviewed  ECG    No new- Personally Reviewed  Physical Exam    GEN: No acute distress.   Neck: JVD 9-10 cm Cardiac: RRR, no murmur, no rubs, or gallops.  Respiratory: diminished to auscultation bilaterally with wheezing GI: Soft, nontender, non-distended  MS: No edema; No deformity. Neuro:  Nonfocal, confused, oriented to name Psych: rouses to verbal  Labs    High Sensitivity Troponin:   Recent Labs  Lab 07/12/2019 1144 06/27/2019 1415  TROPONINIHS 26* 30*      Chemistry Recent Labs  Lab 06/18/19 0900 06/19/19 0636 06/25/2019 1144 06/18/2019 1144 07/05/2019 1543 06/21/19 0538 06/23/19 0644  NA 138   < > 137   < > 140 136 141  K 4.0   < > 3.5   < > 3.5 3.5 3.5  CL 100   < > 99   < > 104 100 106  CO2 26   < > 24   < > 26 25 24   GLUCOSE 158*   < > 148*   < > 143* 132* 120*  BUN 11   < > 18   < > 14 14 14   CREATININE 0.82   < > 1.03   < > 0.91 0.86 0.81  CALCIUM 9.0   < > 9.0   < > 8.7* 8.3* 8.2*  PROT 7.7  --  6.8  --  6.5  --   --  ALBUMIN 4.1  --  3.4*  --  3.0*  --   --   AST 21  --  20  --  23  --   --   ALT 14  --  15  --  14  --   --   ALKPHOS 81  --  68  --  64  --   --   BILITOT 0.7  --  1.0  --  0.9  --   --   GFRNONAA >60   < > >60   < > >60 >60 >60  GFRAA >60   < > >60   < > >60 >60 >60  ANIONGAP 12   < > 14   < > 10 11 11    < > = values in this interval not displayed.     Hematology Recent Labs  Lab 06/28/2019 1543 06/21/19 0538 06/23/19 0644  WBC 7.4 12.0* 11.6*  RBC 4.07* 3.74* 3.44*  HGB 13.0 12.1* 10.9*  HCT 39.4 36.1* 33.6*  MCV 96.8 96.5 97.7  MCH 31.9 32.4 31.7  MCHC 33.0 33.5 32.4  RDW 13.1 13.0 13.1  PLT 177 173 152   No results found for: CHOL, HDL, LDLCALC, LDLDIRECT, TRIG, CHOLHDL Lab Results  Component Value Date   TSH 1.488 06/21/2019   Lab Results  Component Value Date   HGBA1C 6.4 (H) 06/18/2019    Radiology    CT HEAD WO CONTRAST  Result Date: 06/21/2019 CLINICAL DATA:  Encephalopathy.  Additional provided: Pneumothorax EXAM: CT HEAD WITHOUT CONTRAST TECHNIQUE: Contiguous axial  images were obtained from the base of the skull through the vertex without intravenous contrast. COMPARISON:  No pertinent prior studies available for comparison. FINDINGS: Brain: Ill-defined hypoattenuation within the cerebral white is nonspecific, but consistent with chronic small vessel ischemic disease. There is no acute intracranial hemorrhage. No demarcated cortical infarct. No extra-axial fluid collection. No evidence of intracranial mass. No midline shift. Vascular: No hyperdense vessel.  Atherosclerotic calcifications Skull: Normal. Negative for fracture or focal lesion. Sinuses/Orbits: Visualized orbits show no acute finding. Paranasal sinus mucosal thickening greatest within the left frontal sinus and bilateral ethmoid air cells. No significant mastoid effusion. Other: There is prominent multifocal subcutaneous gas within the visualized upper neck and maxillofacial soft tissues. IMPRESSION: No evidence of acute intracranial abnormality. Chronic small vessel ischemic changes within the cerebral white matter. Prominent multifocal subcutaneous gas within the visualized upper neck and maxillofacial soft tissues. Paranasal sinus mucosal thickening. Electronically Signed   By: Kellie Simmering DO   On: 06/21/2019 17:33   DG Chest Port 1 View  Result Date: 06/23/2019 CLINICAL DATA:  Cough, chest tube EXAM: PORTABLE CHEST 1 VIEW COMPARISON:  Radiograph 07/08/2019, CT 06/19/2019 FINDINGS: Right apically directed pigtail pleural drain is in similar position to comparison accounting for differences in technique. Small amount of residual pleural gas is seen towards the right lung apex. Increasing heterogeneous airspace opacity throughout both lungs which is markedly increased from the CT exam but grossly similar to more recent comparison radiography. Minimal residual pneumomediastinum, certainly less conspicuous than comparison exams. Remaining cardiomediastinal contours are unchanged. Improving subcutaneous  emphysema. Degenerative changes are present in the imaged spine and shoulders. Telemetry leads overlie the chest. IMPRESSION: 1. Stable position of right apically directed pleural drain. Small amount of residual pleural gas towards the right lung apex. 2. Persistent heterogeneous airspace opacity throughout both lungs which is markedly increased from the CT exam but grossly similar to more recent comparison radiography. 3. Extensive  subcutaneous emphysema and pneumomediastinum, improving from comparison. Electronically Signed   By: Lovena Le M.D.   On: 06/23/2019 06:00   DG CHEST PORT 1 VIEW  Result Date: 06/22/2019 CLINICAL DATA:  Congested/ shob Patient had had a fall and developed some chest discomfort, went to [facility] where it was discovered that he had a 50% pneumothorax, chest tube was placed. EXAM: PORTABLE CHEST - 1 VIEW COMPARISON:  the previous day's study FINDINGS: Right pigtail chest tube remains directed laterally towards the apex. There is a trace lateral pneumothorax. Moderate right lateral subcutaneous emphysema slightly improved. Extensive patchy airspace infiltrates throughout both lungs with relative sparing at the periphery of the bases. Heart size and mediastinal contours are within normal limits. No effusion. Visualized bones unremarkable. IMPRESSION: 1. Stable right chest tube with trace pneumothorax. 2. Persistent bilateral airspace disease Electronically Signed   By: Lucrezia Europe M.D.   On: 06/22/2019 10:44   ECHOCARDIOGRAM COMPLETE  Result Date: 06/21/2019    ECHOCARDIOGRAM REPORT   Patient Name:   Benjamin Carlson Date of Exam: 06/21/2019 Medical Rec #:  [id number]    Height:       69.0 in Accession #:    [id number]   Weight:       175.0 lb Date of Birth:  [birth year]    BSA:          1.952 m Patient Age:    46 years     BP:           126/47 mmHg Patient Gender: M            HR:           92 bpm. Exam Location:  Inpatient Procedure: 2D Echo Indications:    atrial fibrillation  427.31  History:        Patient has no prior history of Echocardiogram examinations.                 Risk Factors:Former Smoker.  Sonographer:    Johny Chess Referring Phys: [id number] [place]  1. Left ventricular ejection fraction, by estimation, is 45 to 50%. The left ventricle has mildly decreased function. The left ventricle demonstrates global hypokinesis. Left ventricular diastolic parameters were normal.  2. Right ventricular systolic function is normal. The right ventricular size is normal. There is normal pulmonary artery systolic pressure. The estimated right ventricular systolic pressure is 02.5 mmHg.  3. Left atrial size was mildly dilated.  4. The mitral valve is normal in structure. Mild to moderate mitral valve regurgitation. No evidence of mitral stenosis.  5. The aortic valve is normal in structure. Aortic valve regurgitation is not visualized. No aortic stenosis is present.  6. The inferior vena cava is normal in size with greater than 50% respiratory variability, suggesting right atrial pressure of 3 mmHg.  7. Poor acoustical images limit accurate assessment of EF and wall motion. Recommend repeat limited study with definity contrast. FINDINGS  Left Ventricle: Left ventricular ejection fraction, by estimation, is 45 to 50%. The left ventricle has mildly decreased function. The left ventricle demonstrates global hypokinesis. The left ventricular internal cavity size was normal in size. There is  no left ventricular hypertrophy. Left ventricular diastolic parameters were normal. Normal left ventricular filling pressure. Right Ventricle: The right ventricular size is normal. No increase in right ventricular wall thickness. Right ventricular systolic function is normal. There is normal pulmonary artery systolic pressure. The tricuspid regurgitant velocity is 2.51 m/s, and  with an assumed right atrial pressure of 3 mmHg, the estimated right ventricular systolic pressure is 48.5  mmHg. Left Atrium: Left atrial size was mildly dilated. Right Atrium: Right atrial size was normal in size. Pericardium: There is no evidence of pericardial effusion. Mitral Valve: The mitral valve is normal in structure. Normal mobility of the mitral valve leaflets. Mild to moderate mitral valve regurgitation. No evidence of mitral valve stenosis. Tricuspid Valve: The tricuspid valve is normal in structure. Tricuspid valve regurgitation is mild . No evidence of tricuspid stenosis. Aortic Valve: The aortic valve is normal in structure. Aortic valve regurgitation is not visualized. No aortic stenosis is present. Pulmonic Valve: The pulmonic valve was normal in structure. Pulmonic valve regurgitation is not visualized. No evidence of pulmonic stenosis. Aorta: The aortic root is normal in size and structure. Venous: The inferior vena cava is normal in size with greater than 50% respiratory variability, suggesting right atrial pressure of 3 mmHg. IAS/Shunts: The interatrial septum appears to be lipomatous. No atrial level shunt detected by color flow Doppler.  LEFT VENTRICLE PLAX 2D LVIDd:         4.90 cm  Diastology LVIDs:         3.80 cm  LV e' lateral:   12.80 cm/s LV PW:         1.00 cm  LV E/e' lateral: 7.6 LV IVS:        0.80 cm  LV e' medial:    8.49 cm/s LVOT diam:     1.70 cm  LV E/e' medial:  11.4 LV SV:         51 LV SV Index:   26 LVOT Area:     2.27 cm  RIGHT VENTRICLE             IVC RV S prime:     14.70 cm/s  IVC diam: 1.80 cm TAPSE (M-mode): 2.0 cm LEFT ATRIUM             Index       RIGHT ATRIUM           Index LA diam:        3.30 cm 1.69 cm/m  RA Area:     11.40 cm LA Vol (A2C):   81.4 ml 41.70 ml/m RA Volume:   26.40 ml  13.52 ml/m LA Vol (A4C):   50.3 ml 25.77 ml/m LA Biplane Vol: 67.2 ml 34.42 ml/m  AORTIC VALVE LVOT Vmax:   115.00 cm/s LVOT Vmean:  78.600 cm/s LVOT VTI:    0.223 m MITRAL VALVE               TRICUSPID VALVE MV Area (PHT): 4.36 cm    TR Peak grad:   25.2 mmHg MV Decel Time:  174 msec    TR Vmax:        251.00 cm/s MV E velocity: 96.70 cm/s MV A velocity: 92.00 cm/s  SHUNTS MV E/A ratio:  1.05        Systemic VTI:  0.22 m                            Systemic Diam: 1.70 cm Fransico Him MD Electronically signed by Fransico Him MD Signature Date/Time: 06/21/2019/4:35:07 PM    Final     Cardiac Studies   Echo: 06/21/2019 1. Left ventricular ejection fraction, by estimation, is 45 to 50%. The  left ventricle has mildly decreased function. The left  ventricle  demonstrates global hypokinesis. Left ventricular diastolic parameters  were normal.  2. Right ventricular systolic function is normal. The right ventricular  size is normal. There is normal pulmonary artery systolic pressure. The estimated right ventricular systolic pressure is 83.3 mmHg.  3. Left atrial size was mildly dilated.  4. The mitral valve is normal in structure. Mild to moderate mitral valve  regurgitation. No evidence of mitral stenosis.  5. The aortic valve is normal in structure. Aortic valve regurgitation is  not visualized. No aortic stenosis is present.  6. The inferior vena cava is normal in size with greater than 50%  respiratory variability, suggesting right atrial pressure of 3 mmHg.  7. Poor acoustical images limit accurate assessment of EF and wall  motion. Recommend repeat limited study with definity contrast.   Patient Profile     74 y.o. male with the following issues: tobacco use, OA, HLD, alcohol use, recent 50% PTX, Afib, who left AMA 06/19/19 from [facility], and who was admitted to [facility] 06/04 with 100% PTX and Afib.   Assessment & Plan    Paroxysmal atrial fibrillation - spont conversion to SR - BB increased 06/06, up to metoprolol 100 mg bid.  - However, dose not given last pm, pt not alert enough. - BP this am 100/45 - will decrease dose and follow - spoke to RN, she will assess prior to giving po rx  (since yesterday am, all rx has been IV) - will write for IV rx in case he  cannot take po's well - no anticoag indicated 2nd CHA2DS2-VASc = 1 (age)  Hyperlipidemia - on Crestor at home dose - ck profile  Alcohol use - on CIWA protocol, ativan d/c'd  - got total of 6 mg ativan yesterday plus some Morphine  Pneumothorax/COPD - PTX minimal on CXR today, ABX per CCM - wheezing, nebs per CCM - Maintaining O2 sats on high flow [place]  Abnormal echo: - see results above, limited Definity study recommended, will order - w/ extensive airspace dz and I/O + 3.2 L, will ck BNP      For questions or updates, please contact [facility] Please consult [email or web address] for contact info under        Signed, Rosaria Ferries, PA-C  06/23/2019, 8:50 AM    Patient seen and examined  Support findings as noted by R Barrett   Since she saw the pt Mr dellarocco has gone into Afib with RVR   He is now intubated  On exam: Intubated   Sedated Lungs with coarse rhonchi Cardiac Irreg irreg  No S3  No signif murmurs Ext are without edema  Afib   Rates are high    He is on amio gtt   This is not a great long term Rx for him given his lung problems but would use in the short term    If BP holds, would bolus.    Repeat limited echo pending   Will review  Pt critically ill  Dorris Carnes MD

## 2019-06-23 NOTE — Progress Notes (Signed)
PT Cancellation Note  Patient Details Name: Benjamin Carlson MRN: [id number] DOB: 06-Sep-1945   Cancelled Treatment:    Reason Eval/Treat Not Completed: Medical issues which prohibited therapy; patient moving to ICU, about to be intubated.    Reginia Naas 06/23/2019, 10:10 AM  Magda Kiel, [place] [phone] 06/23/2019

## 2019-06-23 NOTE — Plan of Care (Signed)
Discussed with Trauma Critical Care attending -- PCCM will sign off at this time  Please re-engage if needed    Eliseo Gum MSN, AGACNP-BC [place] [phone] If no answer, [phone] 06/23/2019, 11:29 AM

## 2019-06-23 NOTE — Progress Notes (Signed)
  Echocardiogram 2D Echocardiogram has been performed.  Shanie Mauzy G Lakysha Kossman 06/23/2019, 11:52 AM

## 2019-06-23 NOTE — Procedures (Addendum)
Intubation Procedure Note Benjamin Carlson [id number] August 12, 1945  Procedure: Intubation Indications: Airway protection and maintenance  Procedure Details Consent: Unable to obtain consent because of emergent medical necessity. Time Out: Verified patient identification, verified procedure, site/side was marked, verified correct patient position, special equipment/implants available, medications/allergies/relevent history reviewed, required imaging and test results available.  Performed  Maximum sterile technique was used including antiseptics, cap, gloves, gown, hand hygiene, mask and sheet.  MAC and 4    Evaluation Hemodynamic Status: BP stable throughout; O2 sats: stable throughout Patient's Current Condition: stable Complications: No apparent complications Patient did tolerate procedure well. Chest X-ray ordered to verify placement.  CXR: pending.   Benjamin Carlson 06/23/2019    Intubated for airway protection and hypoxia. RSI with 34m etomidate, 100 succinylcholine. Glidescope, size 4 blade, grade 1 view. Single attempt, no complications. Size 7.5 ETT. CXR ordered.   AJesusita Oka MD General and [facility]

## 2019-06-24 ENCOUNTER — Inpatient Hospital Stay (HOSPITAL_COMMUNITY): Payer: Medicare HMO

## 2019-06-24 LAB — BASIC METABOLIC PANEL
Anion gap: 9 (ref 5–15)
BUN: 18 mg/dL (ref 8–23)
CO2: 27 mmol/L (ref 22–32)
Calcium: 7.8 mg/dL — ABNORMAL LOW (ref 8.9–10.3)
Chloride: 105 mmol/L (ref 98–111)
Creatinine, Ser: 0.84 mg/dL (ref 0.61–1.24)
GFR calc Af Amer: >60 mL/min (ref >60)
GFR calc non Af Amer: >60 mL/min (ref >60)
Glucose, Bld: 181 mg/dL — ABNORMAL HIGH (ref 70–99)
Potassium: 3.3 mmol/L — ABNORMAL LOW (ref 3.5–5.1)
Sodium: 141 mmol/L (ref 135–145)

## 2019-06-24 LAB — CBC
HCT: 28.9 % — ABNORMAL LOW (ref 39.0–52.0)
Hemoglobin: 9.4 g/dL — ABNORMAL LOW (ref 13.0–17.0)
MCH: 32.1 pg (ref 26.0–34.0)
MCHC: 32.5 g/dL (ref 30.0–36.0)
MCV: 98.6 fL (ref 80.0–100.0)
Platelets: 150 10*3/uL (ref 150–400)
RBC: 2.93 MIL/uL — ABNORMAL LOW (ref 4.22–5.81)
RDW: 13.5 % (ref 11.5–15.5)
WBC: 9.4 10*3/uL (ref 4.0–10.5)
nRBC: 0 % (ref 0.0–0.2)

## 2019-06-24 LAB — GLUCOSE, CAPILLARY: Glucose-Capillary: 182 mg/dL — ABNORMAL HIGH (ref 70–99)

## 2019-06-24 MED ORDER — POTASSIUM CHLORIDE 20 MEQ/15ML (10%) PO SOLN
40.0000 meq | ORAL | Status: AC
  Administered 2019-06-24 (×2): 40 meq
  Filled 2019-06-24: qty 30

## 2019-06-24 MED ORDER — FUROSEMIDE 10 MG/ML IJ SOLN
20.0000 mg | Freq: Once | INTRAMUSCULAR | Status: AC
  Administered 2019-06-24: 20 mg via INTRAVENOUS
  Filled 2019-06-24: qty 2

## 2019-06-24 MED ORDER — POTASSIUM CHLORIDE 20 MEQ/15ML (10%) PO SOLN
40.0000 meq | ORAL | Status: DC
  Filled 2019-06-24: qty 30

## 2019-06-24 MED ORDER — DEXMEDETOMIDINE HCL IN NACL 400 MCG/100ML IV SOLN
0.4000 ug/kg/h | INTRAVENOUS | Status: AC
  Administered 2019-06-24: 0.4 ug/kg/h via INTRAVENOUS
  Administered 2019-06-24: 1 ug/kg/h via INTRAVENOUS
  Administered 2019-06-24: 0.9 ug/kg/h via INTRAVENOUS
  Administered 2019-06-25: 1.008 ug/kg/h via INTRAVENOUS
  Administered 2019-06-25: 1 ug/kg/h via INTRAVENOUS
  Filled 2019-06-24 (×7): qty 100

## 2019-06-24 MED ORDER — SODIUM CHLORIDE 0.9% FLUSH: 10.0000 mL | INTRAVENOUS | Status: DC | PRN

## 2019-06-24 MED ORDER — ACETAMINOPHEN 160 MG/5ML PO SOLN
650.0000 mg | ORAL | Status: DC | PRN
  Administered 2019-06-25 – 2019-06-27 (×3): 650 mg
  Filled 2019-06-24 (×3): qty 20.3

## 2019-06-24 MED ORDER — SODIUM CHLORIDE 0.9% FLUSH
10.0000 mL | Freq: Two times a day (BID) | INTRAVENOUS | Status: DC
  Administered 2019-06-24 – 2019-06-25 (×5): 10 mL
  Administered 2019-06-26: 30 mL
  Administered 2019-06-27: 10 mL
  Administered 2019-06-27: 20 mL
  Administered 2019-06-28 (×2): 10 mL

## 2019-06-24 MED ORDER — FUROSEMIDE 10 MG/ML IJ SOLN
40.0000 mg | Freq: Once | INTRAMUSCULAR | Status: AC
  Administered 2019-06-24: 40 mg via INTRAVENOUS
  Filled 2019-06-24: qty 4

## 2019-06-24 MED ORDER — BUDESONIDE 0.5 MG/2ML IN SUSP
0.5000 mg | Freq: Two times a day (BID) | RESPIRATORY_TRACT | Status: AC
  Administered 2019-06-24 – 2019-06-25 (×4): 0.5 mg via RESPIRATORY_TRACT
  Filled 2019-06-24 (×4): qty 2

## 2019-06-24 NOTE — TOC Initial Note (Signed)
Transition of Care [facility]) - Initial/Assessment Note    Patient Details  Name: Benjamin Carlson MRN: [id number] Date of Birth: August 28, 1945  Transition [facility]) CM/SW Contact:    Ella Bodo, RN Phone Number: 06/24/2019, 2:50 PM  Clinical Narrative: Pt admitted on 07/05/2019 with right PTX, requiring chest tube placement, and Afib with RVR.  PTA, pt independent, lives at home alone.  Patient emergently intubated on 06/23/19 for airway protection; currently remains intubated.  PT recommending SNF prior to intubation. Will follow progress.                  Expected Discharge Plan: Skilled Nursing Facility Barriers to Discharge: Continued Medical Work up   Patient Goals and CMS Choice        Expected Discharge Plan and Services Expected Discharge Plan: [place]   Discharge Planning Services: CM Consult   Living arrangements for the past 2 months: Single Family Home                                      Prior Living Arrangements/Services Living arrangements for the past 2 months: Single Family Home Lives with:: Self Patient language and need for interpreter reviewed:: Yes        Need for Family Participation in Patient Care: Yes (Comment)     Criminal Activity/Legal Involvement Pertinent to Current Situation/Hospitalization: No - Comment as needed  Activities of Daily Living      Permission Sought/Granted                  Emotional Assessment Appearance:: Appears stated age Attitude/Demeanor/Rapport: Unable to Assess Affect (typically observed): Unable to Assess        Admission diagnosis:  Respiratory failure ([place]) [J96.90] Pneumothorax on right [J93.9] Patient Active Problem List   Diagnosis Date Noted  . Malnutrition of moderate degree 06/23/2019  . Subcutaneous emphysema ([place])   . Pneumothorax 06/18/2019  . Pneumothorax on right   . Abnormal CT scan, stomach 06/03/2013  . Hx of adenomatous colonic polyps 06/03/2013   PCP:   Patient, No Pcp Per Pharmacy:   [place], [place] - [address] [place] [address] Phone: [phone] Fax: [phone]     Social Determinants of Health (SDOH) Interventions    Readmission Risk Interventions No flowsheet data found.   Reinaldo Raddle, RN, BSN  Trauma/Neuro ICU Case Manager [phone]

## 2019-06-24 NOTE — Progress Notes (Signed)
Patient ID: STANLY SI, male   DOB: [birth year], 74 y.o.   MRN: [id number] Follow up - Trauma Critical Care  Patient Details:    ROLF FELLS is an 74 y.o. male.  Lines/tubes : Airway 7.5 mm (Active)  Secured at (cm) 24 cm 06/24/19 0823  Measured From Lips 06/24/19 [place] 06/24/19 0823  Secured By [employer] 06/24/19 0823  Tube Holder Repositioned Yes 06/24/19 0823  Cuff Pressure (cm H2O) 26 cm H2O 06/24/19 0823  Site Condition Dry 06/24/19 0823     CVC Triple Lumen 06/23/19 Right Internal jugular (Active)  Indication for Insertion or Continuance of Line Vasoactive infusions 06/24/19 0800  Exposed Catheter (cm) 0 cm 06/23/19 2000  Site Assessment Clean;Dry;Intact 06/23/19 2000  Proximal Lumen Status Flushed;Saline locked 06/23/19 2000  Medial Lumen Status Infusing;Saline locked 06/23/19 2000  Distal Lumen Status Infusing;Saline locked 06/23/19 2000  Dressing Type Transparent 06/23/19 2000  Dressing Status Clean;Dry;Intact;Antimicrobial disc in place 06/23/19 [place] checked and tightened;Line pulled back 06/23/19 2000  Dressing Change Due 06/30/19 06/23/19 2000     Arterial Line 06/23/19 Right Radial (Active)  Site Assessment Clean;Dry;Intact 06/23/19 2000  Line Status Pulsatile blood flow 06/23/19 2000  Art Line Waveform Appropriate 06/23/19 2000  Art Line Interventions Zeroed and calibrated 06/23/19 2000  Color/Movement/Sensation Capillary refill less than 3 sec 06/23/19 2000  Dressing Type Transparent 06/23/19 2000  Dressing Status Clean;Dry;Intact;Antimicrobial disc in place 06/23/19 2000  Dressing Change Due 06/30/19 06/23/19 2000     Chest Tube 1 Lateral;Right Pleural (Active)  Status -20 cm H2O 06/23/19 2045  Chest Tube Air Leak None 06/23/19 2045  Patency Intervention Tip/tilt 06/23/19 2045  Drainage Description Serosanguineous;Sanguineous 06/23/19 2045  Dressing Status Clean;Dry;Intact 06/23/19 2045  Dressing  Intervention Dressing reinforced 06/23/19 2045  Site Assessment Clean;Dry;Intact 06/23/19 2045  Surrounding Skin Unable to view 06/23/19 2045  Output (mL) 1 mL 06/24/19 0400     NG/OG Tube [facility] mouth Xray Measured external length of tube 60 cm (Active)  External Length of Tube (cm) - (if applicable) 60 cm 07/37/10 2045  Site Assessment Clean;Dry;Intact 06/23/19 2045  Ongoing Placement Verification Xray;No change in respiratory status;No acute changes, not attributed to clinical condition;No change in cm markings or external length of tube from initial placement 06/23/19 2045  Status Infusing tube feed 06/23/19 2045     Urethral Catheter TC 16 Fr. (Active)  Indication for Insertion or Continuance of Catheter Acute urinary retention (I&O Cath for 24 hrs prior to catheter insertion- Inpatient Only) 06/24/19 0800  Site Assessment Clean;Intact 06/24/19 0800  Catheter Maintenance Bag below level of bladder;Catheter secured;Drainage bag/tubing not touching floor;Seal intact;No dependent loops;Insertion date on drainage bag 06/24/19 0800  Collection Container Standard drainage bag 06/24/19 0800  Securement Method Securing device (Describe) 06/24/19 0800  Urinary Catheter Interventions (if applicable) Unclamped 62/69/48 2000  Output (mL) 125 mL 06/24/19 0600    Microbiology/Sepsis markers: Results for orders placed or performed during the hospital encounter of 07/07/2019  MRSA PCR Screening     Status: None   Collection Time: 06/21/19  2:47 AM   Specimen: Nasal Mucosa; Nasopharyngeal  Result Value Ref Range Status   MRSA by PCR NEGATIVE NEGATIVE Final    Comment:        The GeneXpert MRSA Assay (FDA approved for NASAL specimens only), is one component of a comprehensive MRSA colonization surveillance program. It is not intended to diagnose MRSA infection nor to guide or monitor  treatment for MRSA infections. Performed at [facility], [place] [address] [place], [place]  [address]   Culture, blood (routine x 2)     Status: None (Preliminary result)   Collection Time: 06/22/19 10:08 AM   Specimen: BLOOD  Result Value Ref Range Status   Specimen Description BLOOD LEFT ANTECUBITAL  Final   Special Requests   Final    BOTTLES DRAWN AEROBIC AND ANAEROBIC Blood Culture adequate volume   Culture   Final    NO GROWTH 2 DAYS Performed at [facility], [place] [address], [place], [place] [address]    Report Status PENDING  Incomplete  Culture, blood (routine x 2)     Status: None (Preliminary result)   Collection Time: 06/22/19 10:15 AM   Specimen: BLOOD LEFT HAND  Result Value Ref Range Status   Specimen Description BLOOD LEFT HAND  Final   Special Requests   Final    BOTTLES DRAWN AEROBIC ONLY Blood Culture results may not be optimal due to an inadequate volume of blood received in culture bottles   Culture   Final    NO GROWTH 2 DAYS Performed at [facility], [place] [address], [place], [place] [address]    Report Status PENDING  Incomplete  Culture, respiratory (non-expectorated)     Status: None (Preliminary result)   Collection Time: 06/23/19 11:53 AM   Specimen: Tracheal Aspirate; Respiratory  Result Value Ref Range Status   Specimen Description TRACHEAL ASPIRATE  Final   Special Requests NONE  Final   Gram Stain   Final    FEW WBC PRESENT,BOTH PMN AND MONONUCLEAR NO ORGANISMS SEEN    Culture   Final    CULTURE REINCUBATED FOR BETTER GROWTH Performed at [facility], [address], [place], [place] [address]    Report Status PENDING  Incomplete    Anti-infectives:  Anti-infectives (From admission, onward)   Start     Dose/Rate Route Frequency Ordered Stop   06/21/19 1600  azithromycin (ZITHROMAX) 500 mg in sodium chloride 0.9 % 250 mL IVPB     500 mg 250 mL/hr over 60 Minutes Intravenous Every 24 hours 06/21/19 1412     06/21/19 1500  cefTRIAXone (ROCEPHIN) 1 g in sodium chloride 0.9 % 100 mL IVPB     1 g 200 mL/hr over 30  Minutes Intravenous Every 24 hours 06/21/19 1412        Best Practice/Protocols:  VTE Prophylaxis: Lovenox (prophylaxtic dose) Continous Sedation  Consults: Treatment Team:  Lbcardiology, [place], MD    Studies:    Events:  Subjective:    Overnight Issues:   Objective:  Vital signs for last 24 hours: Temp:  [98.9 F (37.2 C)-100.9 F (38.3 C)] 99.5 F (37.5 C) (06/08 0800) Pulse Rate:  [38-155] 127 (06/08 0823) Resp:  [0-27] 16 (06/08 0823) BP: (75-194)/(35-109) 122/47 (06/08 0823) SpO2:  [88 %-100 %] 98 % (06/08 0823) Arterial Line BP: (88-149)/(40-69) 118/44 (06/08 0800) FiO2 (%):  [40 %-100 %] 50 % (06/08 0823)  Hemodynamic parameters for last 24 hours:    Intake/Output from previous day: 06/07 0701 - 06/08 0700 In: 3653.6 [P.O.:120; I.V.:2136.5; NG/GT:503; IV Piggyback:894.1] Out: 1170 [Urine:1125; Chest Tube:45]  Intake/Output this shift: Total I/O In: 506.4 [I.V.:326.4; NG/GT:180] Out: -   Vent settings for last 24 hours: Vent Mode: PRVC FiO2 (%):  [40 %-100 %] 50 % Set Rate:  [16 bmp] 16 bmp Vt Set:  [947 mL] 660 mL PEEP:  [  [place] Pressure:  [18 cmH20-26 cmH20] 23 cmH20  Physical Exam:  General: on vent, some agitation Neuro: awake, F/C HEENT/Neck: ETT Resp: rhonchi B CVS: irreg irreg 120 GI: soft, nontender, BS WNL, no r/g Extremities: edema 1+  Results for orders placed or performed during the hospital encounter of 06/28/2019 (from the past 24 hour(s))  I-STAT 7, (LYTES, BLD GAS, ICA, H+H)     Status: Abnormal   Collection Time: 06/23/19 11:28 AM  Result Value Ref Range   pH, Arterial 7.369 7.350 - 7.450   pCO2 arterial 55.5 (H) 32.0 - 48.0 mmHg   pO2, Arterial 424 (H) 83.0 - 108.0 mmHg   Bicarbonate 32.0 (H) 20.0 - 28.0 mmol/L   TCO2 34 (H) 22 - 32 mmol/L   O2 Saturation 100.0 %   Acid-Base Excess 6.0 (H) 0.0 - 2.0 mmol/L   Sodium 141 135 - 145 mmol/L   Potassium 3.3 (L) 3.5 - 5.1 mmol/L   Calcium, Ion 1.21 1.15  - 1.40 mmol/L   HCT 28.0 (L) 39.0 - 52.0 %   Hemoglobin 9.5 (L) 13.0 - 17.0 g/dL   Sample type ARTERIAL   Glucose, capillary     Status: Abnormal   Collection Time: 06/23/19 11:48 AM  Result Value Ref Range   Glucose-Capillary 153 (H) 70 - 99 mg/dL  Culture, respiratory (non-expectorated)     Status: None (Preliminary result)   Collection Time: 06/23/19 11:53 AM   Specimen: Tracheal Aspirate; Respiratory  Result Value Ref Range   Specimen Description TRACHEAL ASPIRATE    Special Requests NONE    Gram Stain      FEW WBC PRESENT,BOTH PMN AND MONONUCLEAR NO ORGANISMS SEEN    Culture      CULTURE REINCUBATED FOR BETTER GROWTH Performed at [facility], [place] [address] [place], [place] [address]    Report Status PENDING   Magnesium     Status: None   Collection Time: 06/23/19  2:18 PM  Result Value Ref Range   Magnesium 1.9 1.7 - 2.4 mg/dL  Phosphorus     Status: None   Collection Time: 06/23/19  2:18 PM  Result Value Ref Range   Phosphorus 2.6 2.5 - 4.6 mg/dL  Urinalysis, Routine w reflex microscopic     Status: Abnormal   Collection Time: 06/23/19  4:11 PM  Result Value Ref Range   Color, Urine AMBER (A) YELLOW   APPearance HAZY (A) CLEAR   Specific Gravity, Urine 1.023 1.005 - 1.030   pH 5.0 5.0 - 8.0   Glucose, UA 50 (A) NEGATIVE mg/dL   Hgb urine dipstick MODERATE (A) NEGATIVE   Bilirubin Urine NEGATIVE NEGATIVE   Ketones, ur NEGATIVE NEGATIVE mg/dL   Protein, ur 100 (A) NEGATIVE mg/dL   Nitrite NEGATIVE NEGATIVE   Leukocytes,Ua NEGATIVE NEGATIVE   RBC / HPF 0-5 0 - 5 RBC/hpf   WBC, UA 6-10 0 - 5 WBC/hpf   Bacteria, UA RARE (A) NONE SEEN   Squamous Epithelial / LPF 0-5 0 - 5   Mucus PRESENT    Hyaline Casts, UA PRESENT   Glucose, capillary     Status: Abnormal   Collection Time: 06/23/19  4:58 PM  Result Value Ref Range   Glucose-Capillary 165 (H) 70 - 99 mg/dL   Comment 1 Notify RN    Comment 2 Document in Chart   Glucose, capillary     Status: Abnormal    Collection Time: 06/23/19  8:03 PM  Result Value  Ref Range   Glucose-Capillary 171 (H) 70 - 99 mg/dL  Glucose, capillary     Status: Abnormal   Collection Time: 06/23/19 11:38 PM  Result Value Ref Range   Glucose-Capillary 188 (H) 70 - 99 mg/dL  Glucose, capillary     Status: Abnormal   Collection Time: 06/24/19  3:44 AM  Result Value Ref Range   Glucose-Capillary 182 (H) 70 - 99 mg/dL  Basic metabolic panel     Status: Abnormal   Collection Time: 06/24/19  5:02 AM  Result Value Ref Range   Sodium 141 135 - 145 mmol/L   Potassium 3.3 (L) 3.5 - 5.1 mmol/L   Chloride 105 98 - 111 mmol/L   CO2 27 22 - 32 mmol/L   Glucose, Bld 181 (H) 70 - 99 mg/dL   BUN 18 8 - 23 mg/dL   Creatinine, Ser 0.84 0.61 - 1.24 mg/dL   Calcium 7.8 (L) 8.9 - 10.3 mg/dL   GFR calc non Af Amer >60 >60 mL/min   GFR calc Af Amer >60 >60 mL/min   Anion gap 9 5 - 15  CBC     Status: Abnormal   Collection Time: 06/24/19  5:02 AM  Result Value Ref Range   WBC 9.4 4.0 - 10.5 K/uL   RBC 2.93 (L) 4.22 - 5.81 MIL/uL   Hemoglobin 9.4 (L) 13.0 - 17.0 g/dL   HCT 28.9 (L) 39.0 - 52.0 %   MCV 98.6 80.0 - 100.0 fL   MCH 32.1 26.0 - 34.0 pg   MCHC 32.5 30.0 - 36.0 g/dL   RDW 13.5 11.5 - 15.5 %   Platelets 150 150 - 400 K/uL   nRBC 0.0 0.0 - 0.2 %    Assessment & Plan: Present on Admission: . Pneumothorax on right    LOS: 4 days   Additional comments:I reviewed the patient's new clinical lab test results. and CXR ?Fall R PTX- s/p CT. , no PTX and no airleak, continue CT to -20 due to resp failure for now A fib with RVR, new onset- Amio per Cardiology, they are also giving lasix today HLD- resume home meds ETOH use- CIWA, add Precedex COPD - pulmicort Acute hypoxic ventilator dependent respiratory failure - did not wean well this AM, on 50% and PEEP 5 Tobacco abuse Fever, PNA - started rocephin/azithromycin for CAP. Resp CX is pending Encephalopathy - likely 2/2 alcohol withdrawal. Per daughter has  also been showing early signs of dementia  ID - azithromycin/ceftriaxone 6/5>> as above FEN - TF, replete hypokalemia, lasix per Cardiology VTE - SCDs, lovenox Foley - none Follow up - TBD Dispo - ICU I will speak with his family today. Critical Care Total Time*: 31 Minutes  Georganna Skeans, MD, MPH, FACS Trauma & General Surgery Use [email or web address] to contact on call provider  06/24/2019  *Care during the described time interval was provided by me. I have reviewed this patient's available data, including medical history, events of note, physical examination and test results as part of my evaluation.

## 2019-06-24 NOTE — Progress Notes (Signed)
PT Cancellation Note  Patient Details Name: Benjamin Carlson MRN: [id number] DOB: January 02, 1946   Cancelled Treatment:    Reason Eval/Treat Not Completed: Medical issues which prohibited therapy; patient not going to be extubated today per RN, still tachycardic and needing some precedex.  Feel given level of participation and imbalance prior to intubation will hold PT till more stable.     Reginia Naas 06/24/2019, 10:34 AM  Magda Kiel, PT Acute Rehabilitation Services [phone] 06/24/2019

## 2019-06-24 NOTE — Progress Notes (Addendum)
Progress Note  Patient Name: Benjamin Carlson Date of Encounter: 06/24/2019  [facility] Cardiologist: Minus Breeding, MD   Subjective   06/07, pt resp status & mental status deteriorated>>intubated & moved to ICU, hypotensive on Levo, rapid Afib on amio>>SR  06/08: restless on the vent, potential wean today, but O2 demand may be too high.  Inpatient Medications    Scheduled Meds: . budesonide (PULMICORT) nebulizer solution  0.5 mg Nebulization BID  . chlorhexidine gluconate (MEDLINE KIT)  15 mL Mouth Rinse BID  . Chlorhexidine Gluconate Cloth  6 each Topical Daily  . docusate  100 mg Per Tube BID  . enoxaparin (LOVENOX) injection  30 mg Subcutaneous Q12H  . feeding supplement (PIVOT 1.5 CAL)  1,000 mL Per Tube Q24H  . folic acid  1 mg Per Tube Daily  . mouth rinse  15 mL Mouth Rinse 10 times per day  . multivitamin with minerals  1 tablet Per Tube Daily  . sodium chloride flush  10-40 mL Intracatheter Q12H  . thiamine  100 mg Oral Daily   Or  . thiamine  100 mg Intravenous Daily   Continuous Infusions: . sodium chloride 75 mL/hr at 06/24/19 0600  . sodium chloride    . amiodarone 60 mg/hr (06/24/19 0600)  . azithromycin Stopped (06/23/19 1810)  . cefTRIAXone (ROCEPHIN)  IV Stopped (06/23/19 1545)  . fentaNYL infusion INTRAVENOUS 200 mcg/hr (06/24/19 0600)  . norepinephrine (LEVOPHED) Adult infusion 10 mcg/min (06/24/19 0600)   PRN Meds: Place/Maintain arterial line **AND** sodium chloride, acetaminophen, acetaminophen, haloperidol lactate, ipratropium-albuterol, metoprolol tartrate, ondansetron **OR** ondansetron (ZOFRAN) IV, oxyCODONE, sodium chloride flush   Vital Signs    Vitals:   06/24/19 0500 06/24/19 0600 06/24/19 0630 06/24/19 0700  BP: (!) 129/53 (!) 136/109 (!) 128/53 (!) 121/50  Pulse: 75 75 70 70  Resp: 13 (!) _0 Temp:      TempSrc:      SpO2: 100% 99% 99% 99%  Height:        Intake/Output Summary (Last 24 hours) at 06/24/2019 0745 Last data  filed at 06/24/2019 0600 Gross per 24 hour  Intake 3653.6 ml  Output 1170 ml  Net 2483.6 ml   Last 3 Weights 06/18/2019 06/18/2019 02/17/2014  Weight (lbs) 175 lb 0.7 oz 170 lb 202 lb 1.6 oz  Weight (kg) 79.4 kg 77.111 kg 91.672 kg      Telemetry    Atrial fib, rate gradually slowed 06/07, pt converted to SR approx 7 pm, PVCs and pairs - Personally Reviewed  ECG    No new- Personally Reviewed  Physical Exam   GEN: agitated on the vent   Neck: JVD 12 cm Cardiac: RRR, soft murmur, no rubs, or gallops.  Respiratory: decreased BS bases bilaterally with rales, some coarse breath sounds anteriorly. GI: Soft, nontender, non-distended  MS: No edema; No deformity. Neuro: sedated on the vent  Labs    High Sensitivity Troponin:   Recent Labs  Lab 07/07/2019 1144 07/03/2019 1415  TROPONINIHS 26* 30*      Chemistry Recent Labs  Lab 06/18/19 0900 06/19/19 0636 07/01/2019 1144 07/06/2019 1144 07/12/2019 1543 07/13/2019 1543 06/21/19 0538 06/21/19 0538 06/23/19 0644 06/23/19 1128 06/24/19 0502  NA 138   < > 137   < > 140   < > 136   < > 141 141 141  K 4.0   < > 3.5   < > 3.5   < > 3.5   < > 3.5  3.3* 3.3*  CL 100   < > 99   < > 104   < > 100  --  106  --  105  CO2 26   < > 24   < > 26   < > 25  --  24  --  27  GLUCOSE 158*   < > 148*   < > 143*   < > 132*  --  120*  --  181*  BUN 11   < > 18   < > 14   < > 14  --  14  --  18  CREATININE 0.82   < > 1.03   < > 0.91   < > 0.86  --  0.81  --  0.84  CALCIUM 9.0   < > 9.0   < > 8.7*   < > 8.3*  --  8.2*  --  7.8*  PROT 7.7  --  6.8  --  6.5  --   --   --   --   --   --   ALBUMIN 4.1  --  3.4*  --  3.0*  --   --   --   --   --   --   AST 21  --  20  --  23  --   --   --   --   --   --   ALT 14  --  15  --  14  --   --   --   --   --   --   ALKPHOS 81  --  68  --  64  --   --   --   --   --   --   BILITOT 0.7  --  1.0  --  0.9  --   --   --   --   --   --   GFRNONAA >60   < > >60   < > >60   < > >60  --  >60  --  >60  GFRAA >60   < > >60   < >  >60   < > >60  --  >60  --  >60  ANIONGAP 12   < > 14   < > 10   < > 11  --  11  --  9   < > = values in this interval not displayed.     Hematology Recent Labs  Lab 06/21/19 0538 06/21/19 0538 06/23/19 0644 06/23/19 1128 06/24/19 0502  WBC 12.0*  --  11.6*  --  9.4  RBC 3.74*  --  3.44*  --  2.93*  HGB 12.1*   < > 10.9* 9.5* 9.4*  HCT 36.1*   < > 33.6* 28.0* 28.9*  MCV 96.5  --  97.7  --  98.6  MCH 32.4  --  31.7  --  32.1  MCHC 33.5  --  32.4  --  32.5  RDW 13.0  --  13.1  --  13.5  PLT 173  --  152  --  150   < > = values in this interval not displayed.   Lab Results  Component Value Date   CHOL 76 06/23/2019   HDL 25 (L) 06/23/2019   LDLCALC 35 06/23/2019   TRIG 78 06/23/2019   CHOLHDL 3.0 06/23/2019   Lab Results  Component Value Date   TSH 1.488 06/21/2019   Lab Results  Component Value Date   HGBA1C 6.4 (H) 06/18/2019   BNP    Component Value Date/Time   BNP 416.1 (H) 06/23/2019 0930    Radiology    DG Chest Port 1 View  Result Date: 06/23/2019 CLINICAL DATA:  Respiratory failure with central catheter placement EXAM: PORTABLE CHEST 1 VIEW COMPARISON:  June 23, 2019 study obtained earlier in the day FINDINGS: Endotracheal tube tip is 5.4 cm above the carina. Nasogastric tube tip and side port are below the diaphragm with side port seen in the stomach. There is now a central catheter with tip in the superior vena cava. There is a chest tube on the right. There is subcutaneous air on the right but no appreciable pneumothorax. There is extensive airspace opacity bilaterally, similar to earlier in the day. No new opacity evident. Heart upper normal in size with pulmonary vascularity normal. No adenopathy appreciable. No evident bone lesions. IMPRESSION: Tube and catheter positions as described without pneumothorax. Widespread airspace opacity persists. Subcutaneous air again noted in the right. Stable cardiac silhouette. Electronically Signed   By: Lowella Grip  III M.D.   On: 06/23/2019 16:59   DG CHEST PORT 1 VIEW  Result Date: 06/23/2019 CLINICAL DATA:  Endotracheal tube placement. EXAM: PORTABLE CHEST 1 VIEW COMPARISON:  Same day at 0540 hours and CT chest 06/19/2019. FINDINGS: Endotracheal tube terminates 6.1 cm above the carina. Nasogastric tube terminates in the stomach. Heart size stable. Severe diffuse airspace opacification persists. Pigtail catheter is seen in the lateral aspect of the apical right hemithorax. No definite pneumothorax. Subcutaneous emphysema in the neck bilaterally and right chest wall. No pleural fluid. IMPRESSION: 1. Satisfactory endotracheal tube position. 2. Severe diffuse bilateral airspace opacification may be due to edema or pneumonia. Adult respiratory distress syndrome is also considered, in the appropriate clinical setting. 3. Right chest tube in place.  No definite pneumothorax. Electronically Signed   By: Lorin Picket M.D.   On: 06/23/2019 10:48   DG Chest Port 1 View  Result Date: 06/23/2019 CLINICAL DATA:  Cough, chest tube EXAM: PORTABLE CHEST 1 VIEW COMPARISON:  Radiograph 07/13/2019, CT 06/19/2019 FINDINGS: Right apically directed pigtail pleural drain is in similar position to comparison accounting for differences in technique. Small amount of residual pleural gas is seen towards the right lung apex. Increasing heterogeneous airspace opacity throughout both lungs which is markedly increased from the CT exam but grossly similar to more recent comparison radiography. Minimal residual pneumomediastinum, certainly less conspicuous than comparison exams. Remaining cardiomediastinal contours are unchanged. Improving subcutaneous emphysema. Degenerative changes are present in the imaged spine and shoulders. Telemetry leads overlie the chest. IMPRESSION: 1. Stable position of right apically directed pleural drain. Small amount of residual pleural gas towards the right lung apex. 2. Persistent heterogeneous airspace opacity  throughout both lungs which is markedly increased from the CT exam but grossly similar to more recent comparison radiography. 3. Extensive subcutaneous emphysema and pneumomediastinum, improving from comparison. Electronically Signed   By: Lovena Le M.D.   On: 06/23/2019 06:00   DG CHEST PORT 1 VIEW  Result Date: 06/22/2019 CLINICAL DATA:  Congested/ shob Patient had had a fall and developed some chest discomfort, went to [facility] where it was discovered that he had a 50% pneumothorax, chest tube was placed. EXAM: PORTABLE CHEST - 1 VIEW COMPARISON:  the previous day's study FINDINGS: Right pigtail chest tube remains directed laterally towards the apex. There is a trace lateral pneumothorax. Moderate right lateral subcutaneous emphysema slightly improved.  Extensive patchy airspace infiltrates throughout both lungs with relative sparing at the periphery of the bases. Heart size and mediastinal contours are within normal limits. No effusion. Visualized bones unremarkable. IMPRESSION: 1. Stable right chest tube with trace pneumothorax. 2. Persistent bilateral airspace disease Electronically Signed   By: Lucrezia Europe M.D.   On: 06/22/2019 10:44   DG Abd Portable 1V  Result Date: 06/23/2019 CLINICAL DATA:  Evaluate NG tube placement EXAM: PORTABLE ABDOMEN - 1 VIEW COMPARISON:  None FINDINGS: The nasogastric tube tip projects over the expected location of the gastric body. Side port is approximately 5 cm below the GE junction. Air-filled loops of large and small bowel noted. Bilateral airspace opacities. IMPRESSION: NG tube tip projects over the expected location of the gastric body. Electronically Signed   By: Kerby Moors M.D.   On: 06/23/2019 10:43   ECHOCARDIOGRAM LIMITED  Result Date: 06/23/2019    ECHOCARDIOGRAM LIMITED REPORT   Patient Name:   Benjamin Carlson Date of Exam: 06/23/2019 Medical Rec #:  [id number]    Height:       74.0 in Accession #:    [id number]   Weight:       175.0 lb Date of  Birth:  1945/08/13    BSA:          2.054 m Patient Age:    16 years     BP:           92/46 mmHg Patient Gender: M            HR:           114 bpm. Exam Location:  Inpatient Procedure: Limited Echo and Intracardiac Opacification Agent Indications:    Abnormal Echocardiogram [id number]  History:        Patient has prior history of Echocardiogram examinations, most                 recent 06/21/2019. Risk Factors:Dyslipidemia.  Sonographer:    Jonelle Sidle Dance Referring Phys: 71 Oden  1. Limited definity contrast study for LVEF/wall motion.  2. Left ventricular ejection fraction, by estimation, is 45 to 50%. The left ventricle has mildly decreased function. There is moderate hypokinesis of the left ventricular, mid-apical inferior wall and inferoseptal wall. Comparison(s): No significant change from prior study. 06/21/19: LVEF 45-50%, global HK. FINDINGS  Left Ventricle: Left ventricular ejection fraction, by estimation, is 45 to 50%. The left ventricle has mildly decreased function. Moderate hypokinesis of the left ventricular, mid-apical inferior wall and inferoseptal wall. Definity contrast agent was given IV to delineate the left ventricular endocardial borders. Lyman Bishop MD Electronically signed by Lyman Bishop MD Signature Date/Time: 06/23/2019/1:04:04 PM    Final     Cardiac Studies   ECHO: LIMITED 06/23/2019 1. Limited definity contrast study for LVEF/wall motion.  2. Left ventricular ejection fraction, by estimation, is 45 to 50%. The  left ventricle has mildly decreased function. There is moderate  hypokinesis of the left ventricular, mid-apical inferior wall and  inferoseptal wall.   Comparison(s): No significant change from prior study. 06/21/19: LVEF  45-50%, global HK.   FINDINGS  Left Ventricle: Left ventricular ejection fraction, by estimation, is 45  to 50%. The left ventricle has mildly decreased function. Moderate  hypokinesis of the left ventricular, mid-apical  inferior wall and  inferoseptal wall. Definity contrast agent was  given IV to delineate the left ventricular endocardial borders.   Echo: 06/21/2019 1. Left ventricular ejection fraction, by estimation, is  45 to 50%. The  left ventricle has mildly decreased function. The left ventricle  demonstrates global hypokinesis. Left ventricular diastolic parameters  were normal.  2. Right ventricular systolic function is normal. The right ventricular  size is normal. There is normal pulmonary artery systolic pressure. The estimated right ventricular systolic pressure is 65.7 mmHg.  3. Left atrial size was mildly dilated.  4. The mitral valve is normal in structure. Mild to moderate mitral valve  regurgitation. No evidence of mitral stenosis.  5. The aortic valve is normal in structure. Aortic valve regurgitation is  not visualized. No aortic stenosis is present.  6. The inferior vena cava is normal in size with greater than 50%  respiratory variability, suggesting right atrial pressure of 3 mmHg.  7. Poor acoustical images limit accurate assessment of EF and wall  motion. Recommend repeat limited study with definity contrast.   Patient Profile     74 y.o. male with the following issues: tobacco use, OA, HLD, alcohol use, recent 50% PTX, Afib, who left AMA 06/19/19 from [facility], and who was admitted to [facility] 06/04 with 100% PTX and Afib.   Assessment & Plan    Paroxysmal atrial fibrillation - went back into Afib 06/07>>amio IV>>SR  - IV metop ordered but BP too low to give, d/c'd - amio currently at 60 mg/hr, MD advise on decrease dose or convert to po - no anticoag 2nd CHA2DS2-VASc = 1  Hyperlipidemia - LDL at goal - low HDL - continue Crestor  Alcohol use - CIWA protocol d/c'd - now on fentanyl gtt for vent  Pneumothorax/COPD - no PTX on CXR yesterday - no wheezing on vent but coarse breath sounds and still w/ significant O2 requirement  Abnormal echo: - EF slightly  decreased, +WMA - no known hx CP/CAD - BNP 416 - will give a dose of Lasix 40 mg IV, supp K+      For questions or updates, please contact [facility] Please consult [email or web address] for contact info under        Signed, Rosaria Ferries, PA-C  06/24/2019, 7:45 AM    Pt seen and examined   I agree with findings as noted by R Barrett above  Pt intubated   BP 140s/  HR 60s  Back in SR  Lungs  Mild rhonchi   CT on Right Neck:  JVP is increased Cardiac RRR   NO S3    Abd is supple   Ext   Feet are warm   No signif edema  Hemodynamics are much better than yesterday  In SR on amiodarone   WOuld continue Fluid appears to be up   Would give lasix   Cut back on IV fluids    Dorris Carnes MD

## 2019-06-24 NOTE — Progress Notes (Signed)
Patient desat to 88% and maintained. RN assessed patient and patient had bilateral lung crackles and wheezes. R chest tube secured in place with no air leak. RN attempted to in-line suction with no SpO2 increase. RRT checked ventilator and saw no problems. Dr. Donne Hazel paged. Per MD, patient given 20 lasix, 7 PEEP, 60% O2 conc, and STAT CXR. Patient currently 100% SpO2 with current vent settings. Will continue to monitor patient.

## 2019-06-25 ENCOUNTER — Inpatient Hospital Stay (HOSPITAL_COMMUNITY): Payer: Medicare HMO

## 2019-06-25 DIAGNOSIS — Z515 Encounter for palliative care: Secondary | ICD-10-CM

## 2019-06-25 DIAGNOSIS — J9690 Respiratory failure, unspecified, unspecified whether with hypoxia or hypercapnia: Secondary | ICD-10-CM

## 2019-06-25 DIAGNOSIS — I5041 Acute combined systolic (congestive) and diastolic (congestive) heart failure: Secondary | ICD-10-CM

## 2019-06-25 LAB — BASIC METABOLIC PANEL
Anion gap: 9 (ref 5–15)
BUN: 24 mg/dL — ABNORMAL HIGH (ref 8–23)
CO2: 28 mmol/L (ref 22–32)
Calcium: 8.1 mg/dL — ABNORMAL LOW (ref 8.9–10.3)
Chloride: 106 mmol/L (ref 98–111)
Creatinine, Ser: 0.95 mg/dL (ref 0.61–1.24)
GFR calc Af Amer: >60 mL/min (ref >60)
GFR calc non Af Amer: >60 mL/min (ref >60)
Glucose, Bld: 223 mg/dL — ABNORMAL HIGH (ref 70–99)
Potassium: 3.5 mmol/L (ref 3.5–5.1)
Sodium: 143 mmol/L (ref 135–145)

## 2019-06-25 LAB — CBC
HCT: 27.4 % — ABNORMAL LOW (ref 39.0–52.0)
Hemoglobin: 8.8 g/dL — ABNORMAL LOW (ref 13.0–17.0)
MCH: 31.5 pg (ref 26.0–34.0)
MCHC: 32.1 g/dL (ref 30.0–36.0)
MCV: 98.2 fL (ref 80.0–100.0)
Platelets: 149 10*3/uL — ABNORMAL LOW (ref 150–400)
RBC: 2.79 MIL/uL — ABNORMAL LOW (ref 4.22–5.81)
RDW: 13.9 % (ref 11.5–15.5)
WBC: 7.2 10*3/uL (ref 4.0–10.5)
nRBC: 0 % (ref 0.0–0.2)

## 2019-06-25 LAB — GLUCOSE, CAPILLARY
Glucose-Capillary: 142 mg/dL — ABNORMAL HIGH (ref 70–99)
Glucose-Capillary: 176 mg/dL — ABNORMAL HIGH (ref 70–99)

## 2019-06-25 LAB — TRIGLYCERIDES: Triglycerides: 80 mg/dL (ref <150)

## 2019-06-25 LAB — BRAIN NATRIURETIC PEPTIDE: B Natriuretic Peptide: 245.6 pg/mL — ABNORMAL HIGH (ref 0.0–100.0)

## 2019-06-25 MED ORDER — PROPOFOL 1000 MG/100ML IV EMUL
5.0000 ug/kg/min | INTRAVENOUS | Status: DC
  Administered 2019-06-25: 25 ug/kg/min via INTRAVENOUS
  Administered 2019-06-25: 50 ug/kg/min via INTRAVENOUS
  Administered 2019-06-26: 30 ug/kg/min via INTRAVENOUS
  Administered 2019-06-26: 25 ug/kg/min via INTRAVENOUS
  Administered 2019-06-26: 40 ug/kg/min via INTRAVENOUS
  Administered 2019-06-27 (×3): 30 ug/kg/min via INTRAVENOUS
  Administered 2019-06-28: 20 ug/kg/min via INTRAVENOUS
  Administered 2019-06-28: 30 ug/kg/min via INTRAVENOUS
  Administered 2019-06-28 – 2019-06-29 (×3): 50 ug/kg/min via INTRAVENOUS
  Filled 2019-06-25 (×15): qty 100

## 2019-06-25 MED ORDER — PANTOPRAZOLE SODIUM 40 MG PO PACK
40.0000 mg | PACK | Freq: Every day | ORAL | Status: DC
  Administered 2019-06-25 – 2019-06-28 (×4): 40 mg
  Filled 2019-06-25 (×3): qty 20

## 2019-06-25 MED ORDER — FUROSEMIDE 10 MG/ML IJ SOLN: 60.0000 mg | Freq: Once | INTRAMUSCULAR | Status: DC

## 2019-06-25 MED ORDER — POTASSIUM CHLORIDE 20 MEQ/15ML (10%) PO SOLN
40.0000 meq | Freq: Once | ORAL | Status: AC
  Administered 2019-06-25: 40 meq
  Filled 2019-06-25: qty 30

## 2019-06-25 MED ORDER — FUROSEMIDE 10 MG/ML IJ SOLN
80.0000 mg | Freq: Once | INTRAMUSCULAR | Status: AC
  Administered 2019-06-25: 80 mg via INTRAVENOUS
  Filled 2019-06-25: qty 8

## 2019-06-25 NOTE — Progress Notes (Signed)
SLP Cancellation Note  Patient Details Name: Benjamin Carlson MRN: [id number] DOB: 1945-04-27   Cancelled treatment:       Reason Eval/Treat Not Completed: Medical issues which prohibited therapy. Remains intubated. Will d/c prior swallow eval notes and await new orders when medically ready.    Siyona Coto, Katherene Ponto 06/25/2019, 7:23 AM

## 2019-06-25 NOTE — Progress Notes (Signed)
PT Cancellation Note  Patient Details Name: Benjamin Carlson MRN: [id number] DOB: November 30, 1945   Cancelled Treatment:    Reason Eval/Treat Not Completed: Medical issues which prohibited therapy; still resisting treatment when nursing trying to roll, etc and becomes more restless.  On Precedex and RN reports switching to Propofol.  Feel he remains inappropriate for PT at this time.  Will sign off and anticipate new orders when able to participate.    Reginia Naas 06/25/2019, 11:52 AM  Magda Kiel, PT Acute Rehabilitation Services TEIHD:[phone] [phone] 06/25/2019

## 2019-06-25 NOTE — Progress Notes (Addendum)
Progress Note  Patient Name: Benjamin Carlson Date of Encounter: 06/25/2019  Primary Cardiologist: Minus Breeding, MD   Subjective   Intubated and sedated  Inpatient Medications    Scheduled Meds: . budesonide (PULMICORT) nebulizer solution  0.5 mg Nebulization BID  . chlorhexidine gluconate (MEDLINE KIT)  15 mL Mouth Rinse BID  . Chlorhexidine Gluconate Cloth  6 each Topical Daily  . docusate  100 mg Per Tube BID  . enoxaparin (LOVENOX) injection  30 mg Subcutaneous Q12H  . feeding supplement (PIVOT 1.5 CAL)  1,000 mL Per Tube Q24H  . folic acid  1 mg Per Tube Daily  . mouth rinse  15 mL Mouth Rinse 10 times per day  . multivitamin with minerals  1 tablet Per Tube Daily  . sodium chloride flush  10-40 mL Intracatheter Q12H  . thiamine  100 mg Oral Daily   Or  . thiamine  100 mg Intravenous Daily   Continuous Infusions: . sodium chloride    . amiodarone 60 mg/hr (06/25/19 0700)  . azithromycin Stopped (06/24/19 1759)  . cefTRIAXone (ROCEPHIN)  IV Stopped (06/24/19 1446)  . dexmedetomidine (PRECEDEX) IV infusion 1 mcg/kg/hr (06/25/19 0700)  . fentaNYL infusion INTRAVENOUS 50 mcg/hr (06/25/19 0700)  . norepinephrine (LEVOPHED) Adult infusion Stopped (06/24/19 1335)   PRN Meds: Place/Maintain arterial line **AND** sodium chloride, acetaminophen (TYLENOL) oral liquid 160 mg/5 mL, haloperidol lactate, ipratropium-albuterol, metoprolol tartrate, ondansetron **OR** ondansetron (ZOFRAN) IV, oxyCODONE, sodium chloride flush   Vital Signs    Vitals:   06/25/19 0500 06/25/19 0600 06/25/19 0700 06/25/19 0748  BP: (!) 121/52 (!) 123/51 (!) 123/52   Pulse: 60 60 (!) 59 60  Resp: 11 12 (!) 7 16  Temp:      TempSrc:      SpO2: 99% 98% 99% 98%  Height:        Intake/Output Summary (Last 24 hours) at 06/25/2019 0818 Last data filed at 06/25/2019 0700 Gross per 24 hour  Intake 3784.41 ml  Output 2031 ml  Net 1753.41 ml   Net + 8L   BP 120s to 140s   HR 60  Sat 100   T  98  Physical Exam   General: Ill appearing , intubated and sedated, NAD Lungs: Clear to ausculation bilaterally. No wheezes>>ETT in place Cardiovascular: RRR with S1 S2. Abdomen: Soft, non-tender, non-distended. No obvious abdominal masses. Extremities: No edema. No clubbing or cyanosis. DP/PT pulses 2+ bilaterally Neuro: Intubated and sedated.  Psych: Intubated and sedated   Labs    Chemistry Recent Labs  Lab 06/18/19 0900 06/19/19 0636 07/15/2019 1144 07/12/2019 1144 07/12/2019 1543 06/21/19 0538 06/23/19 0644 06/23/19 0644 06/23/19 1128 06/24/19 0502 06/25/19 0510  NA 138   < > 137   < > 140   < > 141   < > 141 141 143  K 4.0   < > 3.5   < > 3.5   < > 3.5   < > 3.3* 3.3* 3.5  CL 100   < > 99   < > 104   < > 106  --   --  105 106  CO2 26   < > 24   < > 26   < > 24  --   --  27 28  GLUCOSE 158*   < > 148*   < > 143*   < > 120*  --   --  181* 223*  BUN 11   < > 18   < >  14   < > 14  --   --  18 24*  CREATININE 0.82   < > 1.03   < > 0.91   < > 0.81  --   --  0.84 0.95  CALCIUM 9.0   < > 9.0   < > 8.7*   < > 8.2*  --   --  7.8* 8.1*  PROT 7.7  --  6.8  --  6.5  --   --   --   --   --   --   ALBUMIN 4.1  --  3.4*  --  3.0*  --   --   --   --   --   --   AST 21  --  20  --  23  --   --   --   --   --   --   ALT 14  --  15  --  14  --   --   --   --   --   --   ALKPHOS 81  --  68  --  64  --   --   --   --   --   --   BILITOT 0.7  --  1.0  --  0.9  --   --   --   --   --   --   GFRNONAA >60   < > >60   < > >60   < > >60  --   --  >60 >60  GFRAA >60   < > >60   < > >60   < > >60  --   --  >60 >60  ANIONGAP 12   < > 14   < > 10   < > 11  --   --  9 9   < > = values in this interval not displayed.     Hematology Recent Labs  Lab 06/23/19 0644 06/23/19 0644 06/23/19 1128 06/24/19 0502 06/25/19 0510  WBC 11.6*  --   --  9.4 7.2  RBC 3.44*  --   --  2.93* 2.79*  HGB 10.9*   < > 9.5* 9.4* 8.8*  HCT 33.6*   < > 28.0* 28.9* 27.4*  MCV 97.7  --   --  98.6 98.2  MCH 31.7  --    --  32.1 31.5  MCHC 32.4  --   --  32.5 32.1  RDW 13.1  --   --  13.5 13.9  PLT 152  --   --  150 149*   < > = values in this interval not displayed.    Cardiac EnzymesNo results for input(s): TROPONINI in the last 168 hours. No results for input(s): TROPIPOC in the last 168 hours.   BNP Recent Labs  Lab 06/23/19 0930  BNP 416.1*     DDimer No results for input(s): DDIMER in the last 168 hours.   Radiology    DG Chest Port 1 View  Result Date: 06/24/2019 CLINICAL DATA:  Lung crackles. EXAM: PORTABLE CHEST 1 VIEW COMPARISON:  Most recent radiograph yesterday.  CT 06/19/2019 FINDINGS: Endotracheal tube tip 4.8 cm from the carina. Enteric tube remains in place with tip below the diaphragm. Right central line in place. Right pigtail catheter in place. Possible tiny apical pneumothorax visualized below the second rib versus apical blebs. Subcutaneous emphysema in the right again seen. Improvement in patchy bilateral airspace opacities, residual is greatest at  the bases. Unchanged heart size and mediastinal contours. No significant pleural fluid. IMPRESSION: 1. Possible tiny right apical pneumothorax. Right pigtail catheter in place. 2. Improvement in bilateral patchy airspace opacities since yesterday, greatest residual at the bases. 3. Persistent subcutaneous emphysema in the right chest wall. Electronically Signed   By: Keith Rake M.D.   On: 06/24/2019 21:14   DG Chest Port 1 View  Result Date: 06/23/2019 CLINICAL DATA:  Respiratory failure with central catheter placement EXAM: PORTABLE CHEST 1 VIEW COMPARISON:  June 23, 2019 study obtained earlier in the day FINDINGS: Endotracheal tube tip is 5.4 cm above the carina. Nasogastric tube tip and side port are below the diaphragm with side port seen in the stomach. There is now a central catheter with tip in the superior vena cava. There is a chest tube on the right. There is subcutaneous air on the right but no appreciable pneumothorax. There  is extensive airspace opacity bilaterally, similar to earlier in the day. No new opacity evident. Heart upper normal in size with pulmonary vascularity normal. No adenopathy appreciable. No evident bone lesions. IMPRESSION: Tube and catheter positions as described without pneumothorax. Widespread airspace opacity persists. Subcutaneous air again noted in the right. Stable cardiac silhouette. Electronically Signed   By: Lowella Grip III M.D.   On: 06/23/2019 16:59   DG CHEST PORT 1 VIEW  Result Date: 06/23/2019 CLINICAL DATA:  Endotracheal tube placement. EXAM: PORTABLE CHEST 1 VIEW COMPARISON:  Same day at 0540 hours and CT chest 06/19/2019. FINDINGS: Endotracheal tube terminates 6.1 cm above the carina. Nasogastric tube terminates in the stomach. Heart size stable. Severe diffuse airspace opacification persists. Pigtail catheter is seen in the lateral aspect of the apical right hemithorax. No definite pneumothorax. Subcutaneous emphysema in the neck bilaterally and right chest wall. No pleural fluid. IMPRESSION: 1. Satisfactory endotracheal tube position. 2. Severe diffuse bilateral airspace opacification may be due to edema or pneumonia. Adult respiratory distress syndrome is also considered, in the appropriate clinical setting. 3. Right chest tube in place.  No definite pneumothorax. Electronically Signed   By: Lorin Picket M.D.   On: 06/23/2019 10:48   DG Abd Portable 1V  Result Date: 06/23/2019 CLINICAL DATA:  Evaluate NG tube placement EXAM: PORTABLE ABDOMEN - 1 VIEW COMPARISON:  None FINDINGS: The nasogastric tube tip projects over the expected location of the gastric body. Side port is approximately 5 cm below the GE junction. Air-filled loops of large and small bowel noted. Bilateral airspace opacities. IMPRESSION: NG tube tip projects over the expected location of the gastric body. Electronically Signed   By: Kerby Moors M.D.   On: 06/23/2019 10:43   ECHOCARDIOGRAM LIMITED  Result  Date: 06/23/2019    ECHOCARDIOGRAM LIMITED REPORT   Patient Name:   ERMIAS TOMEO Date of Exam: 06/23/2019 Medical Rec #:  [id number]    Height:       74.0 in Accession #:    [id number]   Weight:       175.0 lb Date of Birth:  [birth year]    BSA:          2.054 m Patient Age:    74 years     BP:           92/46 mmHg Patient Gender: M            HR:           114 bpm. Exam Location:  Inpatient Procedure: Limited Echo and Intracardiac Opacification Agent Indications:  Abnormal Echocardiogram [id number]  History:        Patient has prior history of Echocardiogram examinations, most                 recent 06/21/2019. Risk Factors:Dyslipidemia.  Sonographer:    Jonelle Sidle Dance Referring Phys: 53 Hood  1. Limited definity contrast study for LVEF/wall motion.  2. Left ventricular ejection fraction, by estimation, is 45 to 50%. The left ventricle has mildly decreased function. There is moderate hypokinesis of the left ventricular, mid-apical inferior wall and inferoseptal wall. Comparison(s): No significant change from prior study. 06/21/19: LVEF 45-50%, global HK. FINDINGS  Left Ventricle: Left ventricular ejection fraction, by estimation, is 45 to 50%. The left ventricle has mildly decreased function. Moderate hypokinesis of the left ventricular, mid-apical inferior wall and inferoseptal wall. Definity contrast agent was given IV to delineate the left ventricular endocardial borders. Lyman Bishop MD Electronically signed by Lyman Bishop MD Signature Date/Time: 06/23/2019/1:04:04 PM    Final    Telemetry    06/25/19 NSR HR 60's - Personally Reviewed  ECG   No new tracing as of 06/25/2019- Personally Reviewed  Cardiac Studies   ECHO: LIMITED 06/23/2019 1. Limited definity contrast study for LVEF/wall motion.  2. Left ventricular ejection fraction, by estimation, is 45 to 50%. The  left ventricle has mildly decreased function. There is moderate  hypokinesis of the left ventricular, mid-apical  inferior wall and  inferoseptal wall.   Comparison(s): No significant change from prior study. 06/21/19: LVEF  45-50%, global HK.   FINDINGS  Left Ventricle: Left ventricular ejection fraction, by estimation, is 45  to 50%. The left ventricle has mildly decreased function. Moderate  hypokinesis of the left ventricular, mid-apical inferior wall and  inferoseptal wall. Definity contrast agent was  given IV to delineate the left ventricular endocardial borders.   Echo: 06/21/2019 1. Left ventricular ejection fraction, by estimation, is 45 to 50%. The  left ventricle has mildly decreased function. The left ventricle  demonstrates global hypokinesis. Left ventricular diastolic parameters  were normal.  2. Right ventricular systolic function is normal. The right ventricular  size is normal. There is normal pulmonary artery systolic pressure. The estimated right ventricular systolic pressure is 08.6 mmHg.  3. Left atrial size was mildly dilated.  4. The mitral valve is normal in structure. Mild to moderate mitral valve  regurgitation. No evidence of mitral stenosis.  5. The aortic valve is normal in structure. Aortic valve regurgitation is  not visualized. No aortic stenosis is present.  6. The inferior vena cava is normal in size with greater than 50%  respiratory variability, suggesting right atrial pressure of 3 mmHg.  7. Poor acoustical images limit accurate assessment of EF and wall  motion. Recommend repeat limited study with definity contrast.   Patient Profile     74 y.o. male with a hx of tobacco use, OA,HLD, alcohol use, recent 50% PTX, Afib who left AMA 06/19/19 from [facility] and was admitted to [facility] 06/04 with 100% PTX and atrial fibrillation   Assessment & Plan    1. PAF: -Back into AF 6/7 and was placed on Amiodarone gtt with conversion to NSR   -Plan 6/8 was to decrease amiodarone to 52m/hr>>> continue for now with plans to transition to p.o. amiodarone once extubated  and able to take p.o. medications Not on anticoagulation   If afib recurs would consider  2. Mild LV dysfunction   LVEF 45 to 50% on echo  Volume appears increased   He is 8 L positive Would give additional lasix 60 mg today     3. ETOH use: -Currently on Precedex/intubated and sedated  4. PTX/COPD:  -CXR from 06/24/2019 with tiny right apical pneumothorax with improved bilateral patchy airspace opacities and persistent subcutaneous emphysema in the right chest wall  -Management per trauma, primary  5. Acute respiratory failure: -Currently on ETT -Management per trauma, primary -On Precedex -Failed vent wean 06/24/19 Diurese  5. Abnormal echocardiogram: -Echocardiogram performed 06/23/19 with LVEF at 45-50% and moderate hypokinesis of the LV, mid-apical inferior wall and inferoseptal walls  -May need further ischemic work-up once more stable   Signed, Kathyrn Drown NP-C [place] Pager: [phone] 06/25/2019, 8:18 AM     For questions or updates, please contact   Please consult [email or web address] for contact info under Cardiology/STEMI.

## 2019-06-25 NOTE — Consult Note (Signed)
Consultation Note Date: 06/25/2019   Patient Name: Benjamin Carlson  DOB: [birth year]  MRN: [id number]  Age / Sex: 74 y.o., male  PCP: Patient, No Pcp Per Referring Physician: Md, Trauma, MD  Reason for Consultation: Establishing goals of care and Psychosocial/spiritual support  HPI/Patient Profile: 74 y.o. male   admitted on 07/04/2019 with a past medical history significant for HLD who was bending over to pick something up several days ago and felt a pop in his chest.   He does not remember a fall or injury. Per EMR he developed SOB and went to [facility] where he was admitted for a 50% PTX.  He had a CT placed by Dr. Constance Haw. He apparently went into a fib yesterday, but pulled his CT out and left AMA. ( family question this information)   He returns to [facility] today for worsening SOB.  He denies CP or anything other complaints.  A CXR reveals a 100% PTX.  He has no rib fractures.  He does have blebs noted, likely from undiagnosed COPD given his tobacco abuse history.    Echocardiogram from 06/23/2019 significant for decreased left ventricular EF of 45 to 50%. Chest x-ray from 06/24/2019 significant for right apical pneumothorax with improved bilateral patchy airspace opacities with persistent subcutaneous emphysema. Patient is currently intubated and remains sedated on Precedex.  Family face treatment option decisions, advanced directive decisions and anticipatory care needs.   Per Dr Bobbye Morton  PMT consulted to begin relationship building in this serious life limiting situation, ultimately family will be facing treatment option decisions, advanced directive decisions and anticipatory care needs.  Today is day 5 of hospital stay.   Hoping for extra support for family/daughter Lenna Sciara who is having a difficult time processing situation.   Clinical Assessment and Goals of Care:  This NP Wadie Lessen reviewed medical records, received  report from team, assessed the patient and then meet at the patient's bedside with his daughter Irving Shows and her husband Marcello Moores to discuss diagnosis, prognosis, GOC,     Concept of Palliative Care was introduced as specialized medical care for people and their families living with serious illness.  If focuses on providing relief from the symptoms and stress of a serious illness.  The goal is to improve quality of life for both the patient and the family.  Created space and opportunity for Melissa to explore her thoughts and feeling regarding her father's medical situation.  She is tearful and easily gets worked up and upset when "thinking about all of this, how did this happen, he was fine a week ago"  Emotional support offered, emotional de-escalation techniques offered and Melissa responds well.  Her husband is a main support for her.     Values and goals of care important to patient and family were attempted to be elicited.      Questions and concerns addressed.  Patient  encouraged to call with questions or concerns.     PMT will continue to support holistically.  Melissa tester tells me that she is her fathers healthcare power of attorney and DURABLE [place].. She has presented paperwork documenting [place] however there is no living will or H POA. She plans to secure a copy from the attorney and bring that to the hospital for scanning tomorrow morning.      SUMMARY OF RECOMMENDATIONS    Code Status/Advance Care Planning:  Full code    Additional Recommendations (Limitations, Scope, Preferences):  Family is open to all offered and availlble medical interventions to prolong life.  Psycho-social/Spiritual:   Desire for further Chaplaincy support:yes-spiritual care/chaplain has been involved with this patient and family   Prognosis:   Unable to determine  Discharge Planning: To Be Determined      Primary  Diagnoses: Present on Admission:  Pneumothorax on right   I have reviewed the medical record, interviewed the patient and family, and examined the patient. The following aspects are pertinent.  Past Medical History:  Diagnosis Date   Arthritis    Elevated cholesterol 05/17/2019   just diagnosed last month, on med for one moth   Social History   Socioeconomic History   Marital status: Divorced    Spouse name: Not on file   Number of children: Not on file   Years of education: Not on file   Highest education level: Not on file  Occupational History   Not on file  Tobacco Use   Smoking status: Current Every Day Smoker    Packs/day: 1.00    Types: Cigarettes  Substance and Sexual Activity   Alcohol use: Yes    Comment: occasionally   Drug use: No   Sexual activity: Not on file  Other Topics Concern   Not on file  Social History Narrative   Not on file   Social Determinants of Health   Financial Resource Strain:    Difficulty of Paying Living Expenses:   Food Insecurity:    Worried About Charity fundraiser in the Last Year:    Arboriculturist in the Last Year:   Transportation Needs:    Film/video editor (Medical):    Lack of Transportation (Non-Medical):   Physical Activity:    Days of Exercise per Week:    Minutes of Exercise per Session:   Stress:    Feeling of Stress :   Social Connections:    Frequency of Communication with Friends and Family:    Frequency of Social Gatherings with Friends and Family:    Attends Religious Services:    Active Member of Clubs or Organizations:    Attends Music therapist:    Marital Status:    Family History  Problem Relation Age of Onset   Breast cancer Mother    Cancer - Other Father    Lung cancer Sister    Lung cancer Brother    Cancer - Other Brother    Scheduled Meds:  budesonide (PULMICORT) nebulizer solution  0.5 mg Nebulization BID   chlorhexidine  gluconate (MEDLINE KIT)  15 mL Mouth Rinse BID   Chlorhexidine Gluconate Cloth  6 each Topical Daily   docusate  100 mg Per Tube BID   enoxaparin (LOVENOX) injection  30 mg Subcutaneous Q12H   feeding supplement (PIVOT 1.5 CAL)  1,000 mL Per Tube X38H   folic acid  1 mg Per Tube Daily   mouth rinse  15 mL Mouth Rinse 10 times per day   multivitamin with minerals  1  tablet Per Tube Daily   sodium chloride flush  10-40 mL Intracatheter Q12H   thiamine  100 mg Oral Daily   Or   thiamine  100 mg Intravenous Daily   Continuous Infusions:  sodium chloride     amiodarone 60 mg/hr (06/25/19 0700)   azithromycin Stopped (06/24/19 1759)   cefTRIAXone (ROCEPHIN)  IV Stopped (06/24/19 1446)   dexmedetomidine (PRECEDEX) IV infusion 1 mcg/kg/hr (06/25/19 0700)   fentaNYL infusion INTRAVENOUS 50 mcg/hr (06/25/19 0700)   norepinephrine (LEVOPHED) Adult infusion Stopped (06/24/19 1335)   PRN Meds:.Place/Maintain arterial line **AND** sodium chloride, acetaminophen (TYLENOL) oral liquid 160 mg/5 mL, haloperidol lactate, ipratropium-albuterol, metoprolol tartrate, ondansetron **OR** ondansetron (ZOFRAN) IV, oxyCODONE, sodium chloride flush Medications Prior to Admission:  Prior to Admission medications   Medication Sig Start Date End Date Taking? Authorizing Provider  acetaminophen (TYLENOL) 500 MG tablet Take 1,000 mg by mouth every 6 (six) hours as needed for pain.   Yes [provider]  Omega-3 Fatty Acids (FISH OIL) 500 MG CAPS Take 1 capsule by mouth daily.   Yes [provider]  rosuvastatin (CRESTOR) 10 MG tablet Take 10 mg by mouth daily. 06/16/19  Yes [provider]   No Known Allergies Review of Systems  Unable to perform ROS: Intubated    Physical Exam Constitutional:      Appearance: He is cachectic. He is ill-appearing.     Interventions: He is sedated and intubated.  Cardiovascular:     Rate and Rhythm: Normal rate.  Pulmonary:      Effort: He is intubated.  Skin:    General: Skin is warm and dry.     Vital Signs: BP (!) 123/52    Pulse 60    Temp 99.6 F (37.6 C) (Axillary)    Resp 16    Ht 6' 2"  (1.88 m)    SpO2 98%    BMI 22.47 kg/m  Pain Scale: CPOT POSS *See Group Information*: S-Acceptable,Sleep, easy to arouse Pain Score: 3    SpO2: SpO2: 98 % O2 Device:SpO2: 98 % O2 Flow Rate: .O2 Flow Rate (L/min): 10 L/min  IO: Intake/output summary:   Intake/Output Summary (Last 24 hours) at 06/25/2019 0846 Last data filed at 06/25/2019 0700 Gross per 24 hour  Intake 3784.41 ml  Output 2031 ml  Net 1753.41 ml    LBM: Last BM Date: 06/18/19 Baseline Weight:   Most recent weight:       Palliative Assessment/Data:   Discussed with Dr Bobbye Morton  Time In: 0930 Time Out: 1040 Time Total: 70 minutes Greater than 50%  of this time was spent counseling and coordinating care related to the above assessment and plan.  Plan is to meet Melissa tomorrow morning at 0800  Signed by: Wadie Lessen, NP   Please contact Palliative Medicine Team phone at [phone] for questions and concerns.  For individual provider: See Shea Evans

## 2019-06-25 NOTE — Progress Notes (Signed)
Trauma/Critical Care Follow Up Note  Subjective:    Overnight Issues:   Objective:  Vital signs for last 24 hours: Temp:  [99.1 F (37.3 C)-100.7 F (38.2 C)] 99.6 F (37.6 C) (06/09 0800) Pulse Rate:  [59-66] 63 (06/09 1400) Resp:  [0-25] 25 (06/09 1448) BP: (101-128)/(46-59) 120/52 (06/09 1400) SpO2:  [84 %-100 %] 94 % (06/09 1448) Arterial Line BP: (110-153)/(46-53) 152/53 (06/09 1400) FiO2 (%):  [40 %-60 %] 40 % (06/09 1448)  Hemodynamic parameters for last 24 hours:    Intake/Output from previous day: 06/08 0701 - 06/09 0700 In: 4290.8 [I.V.:2139; QP/RF:1638.4; IV Piggyback:350] Out: 2031 [Urine:2025; Chest Tube:6]  Intake/Output this shift: Total I/O In: 442.7 [I.V.:192.7; NG/GT:250] Out: 230 [Urine:230]  Vent settings for last 24 hours: Vent Mode: PRVC FiO2 (%):  [40 %-60 %] 40 % Set Rate:  [16 bmp] 16 bmp Vt Set:  [600 mL-660 mL] 600 mL PEEP:  [5 cmH20-7 cmH20] 5 cmH20 Pressure Support:  [10 cmH20] 10 cmH20 Plateau Pressure:  [24 cmH20-29 cmH20] 27 cmH20  Physical Exam:  Gen: comfortable, no distress Neuro: grossly non-focal, does not follow commands HEENT: intubated Neck: supple CV: RRR Pulm: unlabored breathing, mechanically ventilated, R chest tube on sxn, no AL, negligible o/p Abd: soft, nontender GU: clear, yellow urine Extr: wwp, no edema   Results for orders placed or performed during the hospital encounter of 06/22/2019 (from the past 24 hour(s))  Basic metabolic panel     Status: Abnormal   Collection Time: 06/25/19  5:10 AM  Result Value Ref Range   Sodium 143 135 - 145 mmol/L   Potassium 3.5 3.5 - 5.1 mmol/L   Chloride 106 98 - 111 mmol/L   CO2 28 22 - 32 mmol/L   Glucose, Bld 223 (H) 70 - 99 mg/dL   BUN 24 (H) 8 - 23 mg/dL   Creatinine, Ser 0.95 0.61 - 1.24 mg/dL   Calcium 8.1 (L) 8.9 - 10.3 mg/dL   GFR calc non Af Amer >60 >60 mL/min   GFR calc Af Amer >60 >60 mL/min   Anion gap 9 5 - 15  CBC     Status: Abnormal   Collection  Time: 06/25/19  5:10 AM  Result Value Ref Range   WBC 7.2 4.0 - 10.5 K/uL   RBC 2.79 (L) 4.22 - 5.81 MIL/uL   Hemoglobin 8.8 (L) 13.0 - 17.0 g/dL   HCT 27.4 (L) 39.0 - 52.0 %   MCV 98.2 80.0 - 100.0 fL   MCH 31.5 26.0 - 34.0 pg   MCHC 32.1 30.0 - 36.0 g/dL   RDW 13.9 11.5 - 15.5 %   Platelets 149 (L) 150 - 400 K/uL   nRBC 0.0 0.0 - 0.2 %    Assessment & Plan: The plan of care was discussed with the bedside nurse for the day, who is in agreement with this plan and no additional concerns were raised.   Present on Admission: . Pneumothorax on right    LOS: 5 days   Additional comments:I reviewed the patient's new clinical lab test results.   and I reviewed the patients new imaging test results.    ?Fall  R PTX-s/pCT, no PTX and no airleak, WS today A fib with RVR, new onset- Amio per Cardiology, lasix 80 x1 today HLD- resume home meds ETOH use-CIWA COPD - pulmicort Acute hypoxic ventilator dependent respiratory failure - did not wean well this AM, on 40% and PEEP 5, PSV x1h37m today, flipped back due  to hypoxia. Tobacco abuse Fever, PNA - started rocephin/azithromycin for CAP. Resp cx and bcx with NGTD Encephalopathy - likely 2/2 alcohol withdrawal. Per daughter has also been showing early signs of dementia ID -azithromycin/ceftriaxone 6/5 for CAP, 5d total tx, end date 6/9 FEN -TF, replete hypokalemia, lasix per Cardiology VTE -SCDs, lovenox Dispo - ICU, palliative c/s for social support for family and management of family dynamics  Critical Care Total Time: 40 minutes  Benjamin Oka, MD Trauma & General Surgery Please use [email or web address] to contact on call provider  06/25/2019  *Care during the described time interval was provided by me. I have reviewed this patient's available data, including medical history, events of note, physical examination and test results as part of my evaluation.

## 2019-06-26 ENCOUNTER — Inpatient Hospital Stay (HOSPITAL_COMMUNITY): Payer: Medicare HMO

## 2019-06-26 DIAGNOSIS — J9690 Respiratory failure, unspecified, unspecified whether with hypoxia or hypercapnia: Secondary | ICD-10-CM

## 2019-06-26 DIAGNOSIS — Z515 Encounter for palliative care: Secondary | ICD-10-CM

## 2019-06-26 DIAGNOSIS — R0989 Other specified symptoms and signs involving the circulatory and respiratory systems: Secondary | ICD-10-CM

## 2019-06-26 DIAGNOSIS — Z66 Do not resuscitate: Secondary | ICD-10-CM

## 2019-06-26 DIAGNOSIS — R451 Restlessness and agitation: Secondary | ICD-10-CM

## 2019-06-26 LAB — CBC
HCT: 27.5 % — ABNORMAL LOW (ref 39.0–52.0)
Hemoglobin: 8.5 g/dL — ABNORMAL LOW (ref 13.0–17.0)
MCH: 31.3 pg (ref 26.0–34.0)
MCHC: 30.9 g/dL (ref 30.0–36.0)
MCV: 101.1 fL — ABNORMAL HIGH (ref 80.0–100.0)
Platelets: 165 10*3/uL (ref 150–400)
RBC: 2.72 MIL/uL — ABNORMAL LOW (ref 4.22–5.81)
RDW: 14.5 % (ref 11.5–15.5)
WBC: 8.6 10*3/uL (ref 4.0–10.5)
nRBC: 0 % (ref 0.0–0.2)

## 2019-06-26 LAB — CULTURE, RESPIRATORY W GRAM STAIN

## 2019-06-26 LAB — GLUCOSE, CAPILLARY
Glucose-Capillary: 116 mg/dL — ABNORMAL HIGH (ref 70–99)
Glucose-Capillary: 148 mg/dL — ABNORMAL HIGH (ref 70–99)
Glucose-Capillary: 162 mg/dL — ABNORMAL HIGH (ref 70–99)
Glucose-Capillary: 171 mg/dL — ABNORMAL HIGH (ref 70–99)
Glucose-Capillary: 181 mg/dL — ABNORMAL HIGH (ref 70–99)
Glucose-Capillary: 186 mg/dL — ABNORMAL HIGH (ref 70–99)

## 2019-06-26 LAB — BASIC METABOLIC PANEL
Anion gap: 8 (ref 5–15)
BUN: 39 mg/dL — ABNORMAL HIGH (ref 8–23)
CO2: 33 mmol/L — ABNORMAL HIGH (ref 22–32)
Calcium: 8.1 mg/dL — ABNORMAL LOW (ref 8.9–10.3)
Chloride: 106 mmol/L (ref 98–111)
Creatinine, Ser: 1.27 mg/dL — ABNORMAL HIGH (ref 0.61–1.24)
GFR calc Af Amer: >60 mL/min (ref >60)
GFR calc non Af Amer: 56 mL/min — ABNORMAL LOW (ref >60)
Glucose, Bld: 156 mg/dL — ABNORMAL HIGH (ref 70–99)
Potassium: 3.4 mmol/L — ABNORMAL LOW (ref 3.5–5.1)
Sodium: 147 mmol/L — ABNORMAL HIGH (ref 135–145)

## 2019-06-26 LAB — BASIC METABOLIC PANEL WITH GFR
Anion gap: 7 (ref 5–15)
BUN: 40 mg/dL — ABNORMAL HIGH (ref 8–23)
CO2: 31 mmol/L (ref 22–32)
Calcium: 8.2 mg/dL — ABNORMAL LOW (ref 8.9–10.3)
Chloride: 107 mmol/L (ref 98–111)
Creatinine, Ser: 1.1 mg/dL (ref 0.61–1.24)
GFR calc Af Amer: >60 mL/min
GFR calc non Af Amer: >60 mL/min
Glucose, Bld: 195 mg/dL — ABNORMAL HIGH (ref 70–99)
Potassium: 3.9 mmol/L (ref 3.5–5.1)
Sodium: 145 mmol/L (ref 135–145)

## 2019-06-26 MED ORDER — AMIODARONE HCL 200 MG PO TABS
400.0000 mg | ORAL_TABLET | Freq: Two times a day (BID) | ORAL | Status: DC
  Administered 2019-06-26 – 2019-06-28 (×4): 400 mg
  Filled 2019-06-26 (×4): qty 2

## 2019-06-26 MED ORDER — POLYETHYLENE GLYCOL 3350 17 G PO PACK
17.0000 g | PACK | Freq: Every day | ORAL | Status: DC
  Administered 2019-06-26 – 2019-06-28 (×3): 17 g
  Filled 2019-06-26 (×3): qty 1

## 2019-06-26 MED ORDER — QUETIAPINE FUMARATE 25 MG PO TABS
50.0000 mg | ORAL_TABLET | Freq: Two times a day (BID) | ORAL | Status: DC
  Administered 2019-06-26 (×2): 50 mg
  Filled 2019-06-26 (×3): qty 2

## 2019-06-26 MED ORDER — NICOTINE 14 MG/24HR TD PT24
14.0000 mg | MEDICATED_PATCH | Freq: Every day | TRANSDERMAL | Status: DC
  Administered 2019-06-26 – 2019-06-28 (×3): 14 mg via TRANSDERMAL
  Filled 2019-06-26 (×4): qty 1

## 2019-06-26 MED ORDER — AMIODARONE HCL 200 MG PO TABS
400.0000 mg | ORAL_TABLET | Freq: Two times a day (BID) | ORAL | Status: DC
  Administered 2019-06-26: 400 mg via ORAL
  Filled 2019-06-26: qty 2

## 2019-06-26 MED ORDER — POTASSIUM CHLORIDE 20 MEQ PO PACK
40.0000 meq | PACK | Freq: Once | ORAL | Status: AC
  Administered 2019-06-26: 40 meq
  Filled 2019-06-26: qty 2

## 2019-06-26 MED ORDER — INSULIN ASPART 100 UNIT/ML ~~LOC~~ SOLN: 0.0000 [IU] | Freq: Three times a day (TID) | SUBCUTANEOUS | Status: DC

## 2019-06-26 MED ORDER — DEXMEDETOMIDINE HCL IN NACL 400 MCG/100ML IV SOLN
0.4000 ug/kg/h | INTRAVENOUS | Status: DC
  Administered 2019-06-26: 0.4 ug/kg/h via INTRAVENOUS
  Administered 2019-06-26: 0.5 ug/kg/h via INTRAVENOUS
  Administered 2019-06-27 (×3): 1 ug/kg/h via INTRAVENOUS
  Administered 2019-06-27: 0.4 ug/kg/h via INTRAVENOUS
  Administered 2019-06-28: 0.9 ug/kg/h via INTRAVENOUS
  Administered 2019-06-28: 1 ug/kg/h via INTRAVENOUS
  Administered 2019-06-28: 0.9 ug/kg/h via INTRAVENOUS
  Administered 2019-06-29 (×3): 1.2 ug/kg/h via INTRAVENOUS
  Filled 2019-06-26 (×13): qty 100

## 2019-06-26 MED ORDER — INSULIN ASPART 100 UNIT/ML ~~LOC~~ SOLN
0.0000 [IU] | Freq: Four times a day (QID) | SUBCUTANEOUS | Status: DC
  Administered 2019-06-26 (×2): 3 [IU] via SUBCUTANEOUS
  Administered 2019-06-27 (×2): 2 [IU] via SUBCUTANEOUS
  Administered 2019-06-27 (×2): 3 [IU] via SUBCUTANEOUS
  Administered 2019-06-28: 2 [IU] via SUBCUTANEOUS
  Administered 2019-06-28: 3 [IU] via SUBCUTANEOUS
  Administered 2019-06-28: 2 [IU] via SUBCUTANEOUS
  Administered 2019-06-28: 3 [IU] via SUBCUTANEOUS

## 2019-06-26 MED ORDER — FLUCONAZOLE 200 MG PO TABS
200.0000 mg | ORAL_TABLET | Freq: Every day | ORAL | Status: DC
  Administered 2019-06-26: 200 mg
  Filled 2019-06-26 (×2): qty 1

## 2019-06-26 NOTE — Social Work (Signed)
CSW acknowledges substance abuse consult. PT is on the vent and unable to participate. Please re- consult when pt is medically able to participate.   Benjamin Carlson, Latanya Presser, [facility] Social Worker [phone]

## 2019-06-26 NOTE — Progress Notes (Addendum)
Progress Note  Patient Name: Benjamin Carlson Date of Encounter: 06/26/2019  [facility] Cardiologist: Minus Breeding, MD   Subjective   intubated  Inpatient Medications    Scheduled Meds: . chlorhexidine gluconate (MEDLINE KIT)  15 mL Mouth Rinse BID  . Chlorhexidine Gluconate Cloth  6 each Topical Daily  . docusate  100 mg Per Tube BID  . enoxaparin (LOVENOX) injection  30 mg Subcutaneous Q12H  . feeding supplement (PIVOT 1.5 CAL)  1,000 mL Per Tube Q24H  . folic acid  1 mg Per Tube Daily  . mouth rinse  15 mL Mouth Rinse 10 times per day  . multivitamin with minerals  1 tablet Per Tube Daily  . pantoprazole sodium  40 mg Per Tube Daily  . sodium chloride flush  10-40 mL Intracatheter Q12H  . thiamine  100 mg Oral Daily   Or  . thiamine  100 mg Intravenous Daily   Continuous Infusions: . sodium chloride    . amiodarone 60 mg/hr (06/26/19 0800)  . fentaNYL infusion INTRAVENOUS 200 mcg/hr (06/26/19 0800)  . propofol (DIPRIVAN) infusion 35 mcg/kg/min (06/26/19 0800)   PRN Meds: Place/Maintain arterial line **AND** sodium chloride, acetaminophen (TYLENOL) oral liquid 160 mg/5 mL, haloperidol lactate, ipratropium-albuterol, metoprolol tartrate, ondansetron **OR** ondansetron (ZOFRAN) IV, oxyCODONE, sodium chloride flush   Vital Signs    Vitals:   06/26/19 0600 06/26/19 0700 06/26/19 0738 06/26/19 0800  BP: (!) 96/42 (!) 89/40  (!) 144/44  Pulse: (!) 58 (!) 58  89  Resp: 10 11  11   Temp:      TempSrc:      SpO2: 95% 95% 96% 95%  Weight:      Height:        Intake/Output Summary (Last 24 hours) at 06/26/2019 0818 Last data filed at 06/26/2019 0800 Gross per 24 hour  Intake 3765.25 ml  Output 2523 ml  Net 1242.25 ml   Last 3 Weights 06/26/2019 06/18/2019 06/18/2019  Weight (lbs) 173 lb 11.6 oz 175 lb 0.7 oz 170 lb  Weight (kg) 78.8 kg 79.4 kg 77.111 kg      Telemetry    SR - Personally Reviewed  ECG    No new - Personally Reviewed  Physical Exam   Exam  per Dr. Harrington Challenger  Labs    High Sensitivity Troponin:   Recent Labs  Lab 06/18/2019 1144 06/30/2019 1415  TROPONINIHS 26* 30*      Chemistry Recent Labs  Lab 06/28/2019 1144 07/09/2019 1144 06/28/2019 1543 06/21/19 0538 06/24/19 0502 06/25/19 0510 06/26/19 0440  NA 137   < > 140   < > 141 143 147*  K 3.5   < > 3.5   < > 3.3* 3.5 3.4*  CL 99   < > 104   < > 105 106 106  CO2 24   < > 26   < > 27 28 33*  GLUCOSE 148*   < > 143*   < > 181* 223* 156*  BUN 18   < > 14   < > 18 24* 39*  CREATININE 1.03   < > 0.91   < > 0.84 0.95 1.27*  CALCIUM 9.0   < > 8.7*   < > 7.8* 8.1* 8.1*  PROT 6.8  --  6.5  --   --   --   --   ALBUMIN 3.4*  --  3.0*  --   --   --   --   AST 20  --  23  --   --   --   --   ALT 15  --  14  --   --   --   --   ALKPHOS 68  --  64  --   --   --   --   BILITOT 1.0  --  0.9  --   --   --   --   GFRNONAA >60   < > >60   < > >60 >60 56*  GFRAA >60   < > >60   < > >60 >60 >60  ANIONGAP 14   < > 10   < > 9 9 8    < > = values in this interval not displayed.     Hematology Recent Labs  Lab 06/24/19 0502 06/25/19 0510 06/26/19 0440  WBC 9.4 7.2 8.6  RBC 2.93* 2.79* 2.72*  HGB 9.4* 8.8* 8.5*  HCT 28.9* 27.4* 27.5*  MCV 98.6 98.2 101.1*  MCH 32.1 31.5 31.3  MCHC 32.5 32.1 30.9  RDW 13.5 13.9 14.5  PLT 150 149* 165    BNP Recent Labs  Lab 06/23/19 0930 06/25/19 1330  BNP 416.1* 245.6*     DDimer No results for input(s): DDIMER in the last 168 hours.   Radiology    DG CHEST PORT 1 VIEW  Result Date: 06/25/2019 CLINICAL DATA:  75 year old male with history of respiratory failure. EXAM: PORTABLE CHEST 1 VIEW COMPARISON:  Chest x-ray 06/24/2019. FINDINGS: An endotracheal tube is in place with tip 5.5 cm above the carina. There is a right-sided internal jugular central venous catheter with tip terminating in the distal superior vena cava. A nasogastric tube is seen extending into the stomach, however, the tip of the nasogastric tube extends below the lower margin  of the image. Right-sided chest tube in position with pigtail reformed in the lateral right upper hemithorax. No appreciable pneumothorax identified. Diffuse interstitial prominence noted throughout all aspects of the lungs bilaterally. Patchy ill-defined airspace disease also noted scattered throughout the lungs bilaterally, without frank confluence. No pleural effusions. Pulmonary vasculature is partially obscured. Heart size is upper limits of normal. Upper mediastinal contours are within normal limits. Small amount of subcutaneous emphysema in the right lateral chest wall. IMPRESSION: 1. Support apparatus, as above. 2. Diffuse interstitial and airspace disease throughout the lungs bilaterally, similar to the prior study, presumably of infectious or inflammatory etiology. 3. No definite residual right-sided pneumothorax. Electronically Signed   By: Vinnie Langton M.D.   On: 06/25/2019 08:44   DG Chest Port 1 View  Result Date: 06/24/2019 CLINICAL DATA:  Lung crackles. EXAM: PORTABLE CHEST 1 VIEW COMPARISON:  Most recent radiograph yesterday.  CT 06/19/2019 FINDINGS: Endotracheal tube tip 4.8 cm from the carina. Enteric tube remains in place with tip below the diaphragm. Right central line in place. Right pigtail catheter in place. Possible tiny apical pneumothorax visualized below the second rib versus apical blebs. Subcutaneous emphysema in the right again seen. Improvement in patchy bilateral airspace opacities, residual is greatest at the bases. Unchanged heart size and mediastinal contours. No significant pleural fluid. IMPRESSION: 1. Possible tiny right apical pneumothorax. Right pigtail catheter in place. 2. Improvement in bilateral patchy airspace opacities since yesterday, greatest residual at the bases. 3. Persistent subcutaneous emphysema in the right chest wall. Electronically Signed   By: Keith Rake M.D.   On: 06/24/2019 21:14    Cardiac Studies   ECHO: LIMITED 06/23/2019 1. Limited  definity contrast study for LVEF/wall motion.  2. Left ventricular ejection fraction, by estimation, is 45 to 50%. The  left ventricle has mildly decreased function. There is moderate  hypokinesis of the left ventricular, mid-apical inferior wall and  inferoseptal wall.   Comparison(s): No significant change from prior study. 06/21/19: LVEF  45-50%, global HK.   FINDINGS  Left Ventricle: Left ventricular ejection fraction, by estimation, is 45  to 50%. The left ventricle has mildly decreased function. Moderate  hypokinesis of the left ventricular, mid-apical inferior wall and  inferoseptal wall. Definity contrast agent was  given IV to delineate the left ventricular endocardial borders.   Echo: 06/21/2019 1. Left ventricular ejection fraction, by estimation, is 45 to 50%. The  left ventricle has mildly decreased function. The left ventricle  demonstrates global hypokinesis. Left ventricular diastolic parameters  were normal.  2. Right ventricular systolic function is normal. The right ventricular  size is normal. There is normal pulmonary artery systolic pressure. The estimated right ventricular systolic pressure is 35.4 mmHg.  3. Left atrial size was mildly dilated.  4. The mitral valve is normal in structure. Mild to moderate mitral valve  regurgitation. No evidence of mitral stenosis.  5. The aortic valve is normal in structure. Aortic valve regurgitation is  not visualized. No aortic stenosis is present.  6. The inferior vena cava is normal in size with greater than 50%  respiratory variability, suggesting right atrial pressure of 3 mmHg.  7. Poor acoustical images limit accurate assessment of EF and wall  motion. Recommend repeat limited study with definity contrast.   Patient Profile     74 y.o. male with a hx of tobacco use, OA,HLD, alcohol use, recent 50% PTX, Afib who left AMA 06/19/19 from [facility] and was admitted to [facility] 06/04 with 100% Rt PTX and atrial fibrillation    Assessment & Plan    1. PAF: -Back into AF 6/7 and was placed on Amiodarone gtt with conversion to NSR   -Plan 6/8 was to decrease amiodarone to 18m/hr>>> continue for now with plans to transition to p.o. amiodarone once extubated and able to take p.o. medications Not on anticoagulation   If afib recurs would consider   Pt is now DNR there was team meeting with his daughter and son.  Will continue to optimize pulmonary status.  Did now wean well form vent today.    2. Mild LV dysfunction   LVEF 45 to 50% on echo   Volume appears increased   He was 8 L positive yesterday now 9319 cc.  rec'd 80 mg lasix yesterday   no weights  He put out 2523 but with intake he was positive a Liter. In that one day.    ? More lasix Dr. RHarrington Challengerto see.  3. ETOH use: -Currently on Precedex/intubated and sedated  4. PTX/COPD:  -CXR from 06/25/2019 Diffuse interstitial and airspace disease throughout the lungs bilaterally, similar to the prior study, presumably of infectious or inflammatory etiology. No definite residual right-sided pneumothorax.  -Management per trauma, primary  5. Acute respiratory failure: -Currently on ETT -Management per trauma, primary -On Precedex -Failed vent wean 06/24/19 and today.   5. Abnormal echocardiogram: -Echocardiogram performed 06/23/19 with LVEF at 45-50% and moderate hypokinesis of the LV, mid-apical inferior wall and inferoseptal walls  -May need further ischemic work-up once more stable       For questions or updates, please contact CNorth BeachPlease consult [email or web address] for contact info under        Signed, LCecilie Kicks NP  06/26/2019, 8:18 AM    Patient seen and examined   I agree with findings as noted above by L INgold  Pt remains intubated   On exam, BP a little lower today   90s to 100s /   HR 60s (SR) Lungs   Diffuse rhonchi and wheezes  Moving air Cardiac RRR  No S3  Abd sl distended Ext   Feet warm  Tr edema  REcomm:  1    Atrial fibrillation  Pt remains in SR on amiodarone  Continue amiodarone   Could switch to PO  This will cut down on IV fluids    I would continue at 400 bid for now    2  Acute systolic / diastolic CHF   Repeat BMET this PM   Pt still volume overloaded   Would like to give additional lasix      Will continue to follow   Dorris Carnes MD

## 2019-06-26 NOTE — Progress Notes (Signed)
Patient ID: Benjamin Carlson, male   DOB: 1945/04/10, 74 y.o.   MRN: [id number] Follow up - Trauma Critical Care  Patient Details:    Benjamin Carlson is an 74 y.o. male.  Lines/tubes : Airway 7.5 mm (Active)  Secured at (cm) 24 cm 06/26/19 0738  Measured From Lips 06/26/19 0738  Secured Location Right 06/26/19 0738  Secured By [employer] 06/26/19 0738  Tube Holder Repositioned Yes 06/26/19 0738  Cuff Pressure (cm H2O) 26 cm H2O 06/24/19 0823  Site Condition Cool;Dry 06/26/19 0738     CVC Triple Lumen 06/23/19 Right Internal jugular (Active)  Indication for Insertion or Continuance of Line Prolonged intravenous therapies 06/26/19 0747  Exposed Catheter (cm) 0 cm 06/24/19 2000  Site Assessment Clean;Dry;Intact 06/26/19 0800  Proximal Lumen Status Infusing 06/26/19 0800  Medial Lumen Status Infusing 06/26/19 0800  Distal Lumen Status Infusing 06/26/19 0800  Dressing Type Transparent;Occlusive 06/26/19 0800  Dressing Status Clean;Dry;Intact;Antimicrobial disc in place 06/26/19 0800  Line Care Connections checked and tightened 06/25/19 2000  Dressing Intervention Dressing reinforced 06/24/19 2000  Dressing Change Due 06/30/19 06/26/19 0800     Chest Tube 1 Lateral;Right Pleural (Active)  Status To water seal 06/26/19 0800  Chest Tube Air Leak None 06/25/19 2000  Patency Intervention Tip/tilt 06/25/19 2000  Drainage Description Serous 06/26/19 0800  Dressing Status Clean;Intact;Dry 06/25/19 2000  Dressing Intervention Dressing reinforced 06/24/19 2000  Site Assessment Clean;Dry 06/24/19 2000  Surrounding Skin Unable to view 06/26/19 0800  Output (mL) 8 mL 06/26/19 0600     NG/OG Tube O[facility] mouth Xray Measured external length of tube 60 cm (Active)  External Length of Tube (cm) - (if applicable) 60 cm 14/97/02 0800  Site Assessment Clean;Dry;Intact 06/25/19 2000  Ongoing Placement Verification No change in respiratory status 06/26/19 0800  Status Infusing tube  feed 06/26/19 0800  Intake (mL) 120 mL 06/25/19 2200     Urethral Catheter TC 16 Fr. (Active)  Indication for Insertion or Continuance of Catheter Acute urinary retention (I&O Cath for 24 hrs prior to catheter insertion- Inpatient Only) 06/26/19 0747  Site Assessment Clean;Intact 06/26/19 0746  Catheter Maintenance Bag below level of bladder;Catheter secured;Drainage bag/tubing not touching floor;Insertion date on drainage bag;No dependent loops;Bag emptied prior to transport 06/26/19 0746  Collection Container Standard drainage bag 06/26/19 0746  Securement Method Securing device (Describe) 06/26/19 0746  Urinary Catheter Interventions (if applicable) Unclamped 63/78/58 0746  Input (mL) 150 mL 06/24/19 1000  Output (mL) 100 mL 06/26/19 0600    Microbiology/Sepsis markers: Results for orders placed or performed during the hospital encounter of 07/12/2019  MRSA PCR Screening     Status: None   Collection Time: 06/21/19  2:47 AM   Specimen: Nasal Mucosa; Nasopharyngeal  Result Value Ref Range Status   MRSA by PCR NEGATIVE NEGATIVE Final    Comment:        The GeneXpert MRSA Assay (FDA approved for NASAL specimens only), is one component of a comprehensive MRSA colonization surveillance program. It is not intended to diagnose MRSA infection nor to guide or monitor treatment for MRSA infections. Performed at [facility], [place] [address], [place], [place] [address]   Culture, blood (routine x 2)     Status: None (Preliminary result)   Collection Time: 06/22/19 10:08 AM   Specimen: BLOOD  Result Value Ref Range Status   Specimen Description BLOOD LEFT ANTECUBITAL  Final   Special Requests   Final    BOTTLES DRAWN AEROBIC  AND ANAEROBIC Blood Culture adequate volume   Culture   Final    NO GROWTH 4 DAYS Performed at [place] [address] [place], [place] [address]    Report Status PENDING  Incomplete  Culture, blood (routine x 2)     Status: None (Preliminary  result)   Collection Time: 06/22/19 10:15 AM   Specimen: BLOOD LEFT HAND  Result Value Ref Range Status   Specimen Description BLOOD LEFT HAND  Final   Special Requests   Final    BOTTLES DRAWN AEROBIC ONLY Blood Culture results may not be optimal due to an inadequate volume of blood received in culture bottles   Culture   Final    NO GROWTH 4 DAYS Performed at [facility], [place] [address], [place], [place] [address]    Report Status PENDING  Incomplete  Culture, respiratory (non-expectorated)     Status: None (Preliminary result)   Collection Time: 06/23/19 11:53 AM   Specimen: Tracheal Aspirate; Respiratory  Result Value Ref Range Status   Specimen Description TRACHEAL ASPIRATE  Final   Special Requests NONE  Final   Gram Stain   Final    FEW WBC PRESENT,BOTH PMN AND MONONUCLEAR NO ORGANISMS SEEN    Culture   Final    RARE YEAST IDENTIFICATION TO FOLLOW Performed at [facility], [place] [address], [place],  [address]    Report Status PENDING  Incomplete    Anti-infectives:  Anti-infectives (From admission, onward)   Start     Dose/Rate Route Frequency Ordered Stop   06/21/19 1600  azithromycin (ZITHROMAX) 500 mg in sodium chloride 0.9 % 250 mL IVPB        500 mg 250 mL/hr over 60 Minutes Intravenous Every 24 hours 06/21/19 1412 06/25/19 1658   06/21/19 1500  cefTRIAXone (ROCEPHIN) 1 g in sodium chloride 0.9 % 100 mL IVPB  Status:  Discontinued        1 g 200 mL/hr over 30 Minutes Intravenous Every 24 hours 06/21/19 1412 06/25/19 1518      Best Practice/Protocols:  VTE Prophylaxis: Lovenox (prophylaxtic dose) Continous Sedation  Consults: Treatment Team:  Lbcardiology, Rounding, MD    Studies:    Events:  Subjective:    Overnight Issues:   Objective:  Vital signs for last 24 hours: Temp:  [99 F (37.2 C)-101.2 F (38.4 C)] 99.3 F (37.4 C) (06/10 0800) Pulse Rate:  [58-89] 86 (06/10 0829) Resp:  [8-28] 16 (06/10 0829) BP:  (89-144)/(38-71) 144/44 (06/10 0800) SpO2:  [91 %-99 %] 91 % (06/10 0829) Arterial Line BP: (123-152)/(49-56) 152/53 (06/09 1400) FiO2 (%):  [40 %-45 %] 45 % (06/10 0750) Weight:  [78.8 kg] 78.8 kg (06/10 0408)  Hemodynamic parameters for last 24 hours:    Intake/Output from previous day: 06/09 0701 - 06/10 0700 In: 3701.9 [I.V.:1541.8; NG/GT:1810; IV Piggyback:350] Out: 2523 [Urine:2515; Chest Tube:8]  Intake/Output this shift: Total I/O In: 121.7 [I.V.:61.7; NG/GT:60] Out: -   Vent settings for last 24 hours: Vent Mode: PRVC FiO2 (%):  [40 %-45 %] 45 % Set Rate:  [16 bmp] 16 bmp Vt Set:  [600 mL-660 mL] 660 mL PEEP:  [5 cmH20] 5 cmH20 Pressure Support:  [8 cmH20-10 cmH20] 8 cmH20 Plateau Pressure:  [26 cmH20-30 cmH20] 30 cmH20  Physical Exam:  General: on vent Neuro: agitated, does follow some commands HEENT/Neck: ETT Resp: rhonchi and wheeze B CVS: irreg irreg 80s GI: soft, nontender, BS WNL, no r/g Extremities: edema  1+  Results for orders placed or performed during the hospital encounter of 07/04/2019 (from the past 24 hour(s))  Brain natriuretic peptide     Status: Abnormal   Collection Time: 06/25/19  1:30 PM  Result Value Ref Range   B Natriuretic Peptide 245.6 (H) 0.0 - 100.0 pg/mL  Triglycerides     Status: None   Collection Time: 06/25/19  5:30 PM  Result Value Ref Range   Triglycerides 80 <150 mg/dL  Glucose, capillary     Status: Abnormal   Collection Time: 06/25/19  8:00 PM  Result Value Ref Range   Glucose-Capillary 142 (H) 70 - 99 mg/dL  Glucose, capillary     Status: Abnormal   Collection Time: 06/25/19 11:42 PM  Result Value Ref Range   Glucose-Capillary 176 (H) 70 - 99 mg/dL  Glucose, capillary     Status: Abnormal   Collection Time: 06/26/19  3:42 AM  Result Value Ref Range   Glucose-Capillary 171 (H) 70 - 99 mg/dL  Basic metabolic panel     Status: Abnormal   Collection Time: 06/26/19  4:40 AM  Result Value Ref Range   Sodium 147 (H) 135  - 145 mmol/L   Potassium 3.4 (L) 3.5 - 5.1 mmol/L   Chloride 106 98 - 111 mmol/L   CO2 33 (H) 22 - 32 mmol/L   Glucose, Bld 156 (H) 70 - 99 mg/dL   BUN 39 (H) 8 - 23 mg/dL   Creatinine, Ser 1.27 (H) 0.61 - 1.24 mg/dL   Calcium 8.1 (L) 8.9 - 10.3 mg/dL   GFR calc non Af Amer 56 (L) >60 mL/min   GFR calc Af Amer >60 >60 mL/min   Anion gap 8 5 - 15  CBC     Status: Abnormal   Collection Time: 06/26/19  4:40 AM  Result Value Ref Range   WBC 8.6 4.0 - 10.5 K/uL   RBC 2.72 (L) 4.22 - 5.81 MIL/uL   Hemoglobin 8.5 (L) 13.0 - 17.0 g/dL   HCT 27.5 (L) 39 - 52 %   MCV 101.1 (H) 80.0 - 100.0 fL   MCH 31.3 26.0 - 34.0 pg   MCHC 30.9 30.0 - 36.0 g/dL   RDW 14.5 11.5 - 15.5 %   Platelets 165 150 - 400 K/uL   nRBC 0.0 0.0 - 0.2 %  Glucose, capillary     Status: Abnormal   Collection Time: 06/26/19  8:19 AM  Result Value Ref Range   Glucose-Capillary 116 (H) 70 - 99 mg/dL    Assessment & Plan: Present on Admission: . Pneumothorax on right    LOS: 6 days   Additional comments:I reviewed the patient's new clinical lab test results. Marland Kitchen ?Fall  R PTX-s/pCT, no PTX and no airleak, WS 6/9, check CXR now A fib with RVR, new onset- Amio per Cardiology HLD- resume home meds ETOH use-CIWA COPD - pulmicort Acute hypoxic ventilator dependent respiratory failure - did not wean well this AM, on 40% and PEEP 5, duonebs Tobacco abuse Fever, PNA - completed rocephin/azithromycin for CAP. Resp cx growing yeast so add Diflucan and plan 7d Encephalopathy - likely 2/2 alcohol withdrawal. Per daughter has also been showing early signs of dementia FEN -TF, replete hypokalemia, D/C foley unless Cardiology plans further diuresis. Add Precedex and seroquel VTE -SCDs, lovenox Dispo - ICU, I met with the Palliative Care Team, his 62, his daughter and his son at the bedside. They had a lengthy GOC discussion with the Palliative Care Team  already today. Plan is to make him DNR and continue to try to  optimize his pulmonary status with plan for one way extubation when appropriate. He did not wean well today so far. I advised them this will likely take a few days. If he makes no progress, we will have another discussion. They do want to respect his wishes.  Critical Care Total Time*: 3 Minutes  Georganna Skeans, MD, MPH, FACS Trauma & General Surgery Use [email or web address] to contact on call provider  06/26/2019  *Care during the described time interval was provided by me. I have reviewed this patient's available data, including medical history, events of note, physical examination and test results as part of my evaluation.

## 2019-06-26 NOTE — TOC CAGE-AID Note (Signed)
Transition of Care [facility]) - CAGE-AID Screening   Patient Details  Name: Benjamin Carlson MRN: [id number] Date of Birth: 1945/02/08  Transition of Care [facility]) CM/SW Contact:    Emeterio Reeve, [place] Phone Number: 06/26/2019, 1:41 PM   Clinical Narrative:  Pt is unable to participate in assessment due to pt being on the vent. Please re-consult when medically ready.   CAGE-AID Screening: Substance Abuse Screening unable to be completed due to: : Patient unable to participate            Providence Crosby Clinical Social Worker [phone]

## 2019-06-26 NOTE — Progress Notes (Addendum)
Patient ID: Benjamin Carlson, male   DOB: [birth year], 74 y.o.   MRN: [id number]  This NP visited patient at the bedside as a follow up to  yesterday's [place] and to meet as scheduled with family for continued conversation regarding current medical situation; goals of care, treatment option decisions, advanced directive decisions, and end-of-life wishes.  Present for meeting were [facility] Tester/HPOA # [phone],  Benjamin Carlson # [phone] and daughter/  Benjamin Carlson" Benjamin Carlson # [phone].  After review of patient's documents reflecting advanced directive and desire for a natural death, HPOA and family agree to honor patient's documented paperwork. A copy of the desire for natural death and H POA is placed in her chart  Created space and opportunity for family to explore the thoughts and feelings regarding patient's current medical situation.  All are in strong agreement that they do not "want the him to suffer".  All agree that actually the patient would not want to be hospitalized at all.  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   All 3 family members believe that the patient's agitation is coming from the fact that he "does not want to be in the hospital"  and "does not want tubes in his throat."  This is very hard for the family to be faced with these difficult choices.  Emotional support offered  Plan of Care: -Family agree for one-way extubation from ventilator, when patient is medically             maximized for best rate of success  Daughter wants to be present for    extubation -DNR/DNI- documented today with consensus of three family members at bedside - main focus of care is comfort and dignity "do not let him suffer"     - maximize medications for agitation and dyspnea     - recommend Nicotine patch ( patient was heavy smoker, withdrawal  may be                    contributing to   agtitaion)     - recommend Seroquel 50 mg bid (agitation) -continue with current medical interventions treating treatable conditions, family is hopeful for improvement  -If patient is weaned and does not thrive,  family wish to focus on comfort and allow for natural death   Questions and concerns addressed   Discussed with Benjamin Carlson and bedside RN/ Benjamin Carlson  Total time spent on the unit was 60 minutes   PMT will continue to support holistically  Greater than 50% of the time was spent in counseling and coordination of care  Benjamin Lessen NP  Palliative Medicine Team Team Phone # [phone] Pager [phone]

## 2019-06-27 ENCOUNTER — Inpatient Hospital Stay (HOSPITAL_COMMUNITY): Payer: Medicare HMO

## 2019-06-27 LAB — CULTURE, BLOOD (ROUTINE X 2)
Culture: NO GROWTH
Culture: NO GROWTH
Special Requests: ADEQUATE

## 2019-06-27 LAB — BASIC METABOLIC PANEL
Anion gap: 7 (ref 5–15)
BUN: 37 mg/dL — ABNORMAL HIGH (ref 8–23)
CO2: 31 mmol/L (ref 22–32)
Calcium: 7.8 mg/dL — ABNORMAL LOW (ref 8.9–10.3)
Chloride: 107 mmol/L (ref 98–111)
Creatinine, Ser: 1 mg/dL (ref 0.61–1.24)
GFR calc Af Amer: >60 mL/min (ref >60)
GFR calc non Af Amer: >60 mL/min (ref >60)
Glucose, Bld: 190 mg/dL — ABNORMAL HIGH (ref 70–99)
Potassium: 4 mmol/L (ref 3.5–5.1)
Sodium: 145 mmol/L (ref 135–145)

## 2019-06-27 LAB — CBC
HCT: 27.6 % — ABNORMAL LOW (ref 39.0–52.0)
Hemoglobin: 8.6 g/dL — ABNORMAL LOW (ref 13.0–17.0)
MCH: 31.6 pg (ref 26.0–34.0)
MCHC: 31.2 g/dL (ref 30.0–36.0)
MCV: 101.5 fL — ABNORMAL HIGH (ref 80.0–100.0)
Platelets: 187 10*3/uL (ref 150–400)
RBC: 2.72 MIL/uL — ABNORMAL LOW (ref 4.22–5.81)
RDW: 14.7 % (ref 11.5–15.5)
WBC: 7.8 10*3/uL (ref 4.0–10.5)
nRBC: 0 % (ref 0.0–0.2)

## 2019-06-27 LAB — GLUCOSE, CAPILLARY
Glucose-Capillary: 192 mg/dL — ABNORMAL HIGH (ref 70–99)
Glucose-Capillary: 153 mg/dL — ABNORMAL HIGH (ref 70–99)
Glucose-Capillary: 143 mg/dL — ABNORMAL HIGH (ref 70–99)
Glucose-Capillary: 131 mg/dL — ABNORMAL HIGH (ref 70–99)
Glucose-Capillary: 191 mg/dL — ABNORMAL HIGH (ref 70–99)
Glucose-Capillary: 156 mg/dL — ABNORMAL HIGH (ref 70–99)

## 2019-06-27 MED ORDER — IBUPROFEN 200 MG PO TABS: 800.0000 mg | ORAL_TABLET | Freq: Three times a day (TID) | ORAL | Status: DC | PRN

## 2019-06-27 MED ORDER — ACETAMINOPHEN 160 MG/5ML PO SOLN
1000.0000 mg | Freq: Four times a day (QID) | ORAL | Status: DC
  Administered 2019-06-27 – 2019-06-29 (×7): 1000 mg
  Filled 2019-06-27 (×7): qty 40.6

## 2019-06-27 MED ORDER — CLONAZEPAM 0.5 MG PO TABS
0.5000 mg | ORAL_TABLET | Freq: Two times a day (BID) | ORAL | Status: DC
  Administered 2019-06-27 – 2019-06-28 (×4): 0.5 mg
  Filled 2019-06-27 (×4): qty 1

## 2019-06-27 MED ORDER — FLUCONAZOLE 200 MG PO TABS
400.0000 mg | ORAL_TABLET | Freq: Every day | ORAL | Status: DC
  Administered 2019-06-27 – 2019-06-28 (×2): 400 mg
  Filled 2019-06-27 (×3): qty 2

## 2019-06-27 MED ORDER — QUETIAPINE FUMARATE 100 MG PO TABS
100.0000 mg | ORAL_TABLET | Freq: Two times a day (BID) | ORAL | Status: DC
  Administered 2019-06-27 – 2019-06-28 (×4): 100 mg
  Filled 2019-06-27 (×4): qty 1

## 2019-06-27 MED ORDER — CHLORHEXIDINE GLUCONATE CLOTH 2 % EX PADS
6.0000 | MEDICATED_PAD | Freq: Every day | CUTANEOUS | Status: DC
  Administered 2019-06-28 – 2019-06-29 (×2): 6 via TOPICAL

## 2019-06-27 MED ORDER — IBUPROFEN 200 MG PO TABS
800.0000 mg | ORAL_TABLET | Freq: Three times a day (TID) | ORAL | Status: DC | PRN
  Administered 2019-06-27: 800 mg
  Filled 2019-06-27: qty 4

## 2019-06-27 MED ORDER — FUROSEMIDE 10 MG/ML IJ SOLN
80.0000 mg | Freq: Once | INTRAMUSCULAR | Status: AC
  Administered 2019-06-27: 80 mg via INTRAVENOUS
  Filled 2019-06-27: qty 8

## 2019-06-27 NOTE — Progress Notes (Signed)
Trauma/Critical Care Follow Up Note  Subjective:    Overnight Issues:   Objective:  Vital signs for last 24 hours: Temp:  [98.7 F (37.1 C)-100.7 F (38.2 C)] 100.7 F (38.2 C) (06/11 1200) Pulse Rate:  [58-80] 65 (06/11 1200) Resp:  [0-24] 5 (06/11 1200) BP: (103-124)/(36-59) 121/48 (06/11 1200) SpO2:  [9 %-99 %] 92 % (06/11 1200) FiO2 (%):  [40 %-50 %] 40 % (06/11 1113) Weight:  [78.9 kg] 78.9 kg (06/11 0425)  Hemodynamic parameters for last 24 hours:    Intake/Output from previous day: 06/10 0701 - 06/11 0700 In: 2493 [I.V.:1053; NG/GT:1440] Out: 0354 [Urine:1550; Chest Tube:4]  Intake/Output this shift: Total I/O In: 510.1 [I.V.:210.1; NG/GT:300] Out: 375 [Urine:375]  Vent settings for last 24 hours: Vent Mode: PSV;CPAP FiO2 (%):  [40 %-50 %] 40 % Set Rate:  [16 bmp] 16 bmp Vt Set:  [656 mL] 660 mL PEEP:  [5 cmH20] 5 cmH20 Pressure Support:  [10 cmH20] 10 cmH20 Plateau Pressure:  [25 cmH20-29 cmH20] 29 cmH20  Physical Exam:  Gen: comfortable, no distress Neuro: grossly non-focal, does not follow commands HEENT: intubated Neck: supple CV: RRR Pulm: unlabored breathing, mechanically ventilated Abd: soft, nontender GU: clear, yellow urine Extr: wwp, no edema   Results for orders placed or performed during the hospital encounter of 07/07/2019 (from the past 24 hour(s))  Basic metabolic panel     Status: Abnormal   Collection Time: 06/26/19  3:27 PM  Result Value Ref Range   Sodium 145 135 - 145 mmol/L   Potassium 3.9 3.5 - 5.1 mmol/L   Chloride 107 98 - 111 mmol/L   CO2 31 22 - 32 mmol/L   Glucose, Bld 195 (H) 70 - 99 mg/dL   BUN 40 (H) 8 - 23 mg/dL   Creatinine, Ser 1.10 0.61 - 1.24 mg/dL   Calcium 8.2 (L) 8.9 - 10.3 mg/dL   GFR calc non Af Amer >60 >60 mL/min   GFR calc Af Amer >60 >60 mL/min   Anion gap 7 5 - 15  Glucose, capillary     Status: Abnormal   Collection Time: 06/26/19  3:53 PM  Result Value Ref Range   Glucose-Capillary 186 (H) 70  - 99 mg/dL  Glucose, capillary     Status: Abnormal   Collection Time: 06/26/19  7:32 PM  Result Value Ref Range   Glucose-Capillary 162 (H) 70 - 99 mg/dL  Glucose, capillary     Status: Abnormal   Collection Time: 06/26/19 11:39 PM  Result Value Ref Range   Glucose-Capillary 148 (H) 70 - 99 mg/dL  Glucose, capillary     Status: Abnormal   Collection Time: 06/27/19  3:18 AM  Result Value Ref Range   Glucose-Capillary 192 (H) 70 - 99 mg/dL  CBC     Status: Abnormal   Collection Time: 06/27/19  4:30 AM  Result Value Ref Range   WBC 7.8 4.0 - 10.5 K/uL   RBC 2.72 (L) 4.22 - 5.81 MIL/uL   Hemoglobin 8.6 (L) 13.0 - 17.0 g/dL   HCT 27.6 (L) 39 - 52 %   MCV 101.5 (H) 80.0 - 100.0 fL   MCH 31.6 26.0 - 34.0 pg   MCHC 31.2 30.0 - 36.0 g/dL   RDW 14.7 11.5 - 15.5 %   Platelets 187 150 - 400 K/uL   nRBC 0.0 0.0 - 0.2 %  Basic metabolic panel     Status: Abnormal   Collection Time: 06/27/19  4:30 AM  Result Value Ref Range   Sodium 145 135 - 145 mmol/L   Potassium 4.0 3.5 - 5.1 mmol/L   Chloride 107 98 - 111 mmol/L   CO2 31 22 - 32 mmol/L   Glucose, Bld 190 (H) 70 - 99 mg/dL   BUN 37 (H) 8 - 23 mg/dL   Creatinine, Ser 1.00 0.61 - 1.24 mg/dL   Calcium 7.8 (L) 8.9 - 10.3 mg/dL   GFR calc non Af Amer >60 >60 mL/min   GFR calc Af Amer >60 >60 mL/min   Anion gap 7 5 - 15  Glucose, capillary     Status: Abnormal   Collection Time: 06/27/19  7:46 AM  Result Value Ref Range   Glucose-Capillary 153 (H) 70 - 99 mg/dL  Glucose, capillary     Status: Abnormal   Collection Time: 06/27/19 11:29 AM  Result Value Ref Range   Glucose-Capillary 143 (H) 70 - 99 mg/dL    Assessment & Plan: The plan of care was discussed with the bedside nurse for the day, who is in agreement with this plan and no additional concerns were raised.   Present on Admission: . Pneumothorax on right    LOS: 7 days   Additional comments:I reviewed the patient's new clinical lab test results.   and I reviewed the  patients new imaging test results.    ?Fall  R PTX-s/pCT,no PTX andnoairleak, WS 6/9, remove today A fib with RVR, new onset-Amio per Cardiology HLD- resume home meds ETOH use-CIWA COPD -pulmicort Acute hypoxic ventilator dependent respiratory failure- PSV at the time of my exam, continue as tolerated, duonebs Tobacco abuse Fever, PNA - completed rocephin/azithromycin for CAP.Resp cx growing yeast so add Diflucan and plan 7d Encephalopathy -likely2/2 alcohol withdrawal. Per daughter has also been showing early signs of dementia FEN -TF, replete hypokalemia, D/C foley unless Cardiology plans further diuresis. Precedex, seroquel, which was increased, clonazepam added VTE-SCDs, lovenox Dispo- ICU  Clinical update provided to son at bedside. Questions from daughter relayed and answered.   Critical Care Total Time: 40 minutes  Jesusita Oka, MD Trauma & General Surgery Please use [email or web address] to contact on call provider  06/27/2019  *Care during the described time interval was provided by me. I have reviewed this patient's available data, including medical history, events of note, physical examination and test results as part of my evaluation.

## 2019-06-27 NOTE — Progress Notes (Addendum)
Progress Note  Patient Name: Benjamin Carlson Date of Encounter: 06/27/2019  [facility] Cardiologist: Minus Breeding, MD   Subjective   Awake but does not respond by shaking head to questions. Intubated  Inpatient Medications    Scheduled Meds: . amiodarone  400 mg Per Tube BID  . chlorhexidine gluconate (MEDLINE KIT)  15 mL Mouth Rinse BID  . Chlorhexidine Gluconate Cloth  6 each Topical Daily  . docusate  100 mg Per Tube BID  . enoxaparin (LOVENOX) injection  30 mg Subcutaneous Q12H  . feeding supplement (PIVOT 1.5 CAL)  1,000 mL Per Tube Q24H  . fluconazole  400 mg Per Tube Daily  . folic acid  1 mg Per Tube Daily  . insulin aspart  0-15 Units Subcutaneous QID  . mouth rinse  15 mL Mouth Rinse 10 times per day  . multivitamin with minerals  1 tablet Per Tube Daily  . nicotine  14 mg Transdermal Daily  . pantoprazole sodium  40 mg Per Tube Daily  . polyethylene glycol  17 g Per Tube Daily  . QUEtiapine  50 mg Per Tube BID  . sodium chloride flush  10-40 mL Intracatheter Q12H  . thiamine  100 mg Oral Daily   Or  . thiamine  100 mg Intravenous Daily   Continuous Infusions: . sodium chloride    . dexmedetomidine (PRECEDEX) IV infusion 0.6 mcg/kg/hr (06/27/19 0800)  . fentaNYL infusion INTRAVENOUS 125 mcg/hr (06/27/19 0800)  . propofol (DIPRIVAN) infusion 30 mcg/kg/min (06/27/19 0800)   PRN Meds: Place/Maintain arterial line **AND** sodium chloride, acetaminophen (TYLENOL) oral liquid 160 mg/5 mL, haloperidol lactate, ipratropium-albuterol, metoprolol tartrate, ondansetron **OR** ondansetron (ZOFRAN) IV, oxyCODONE, sodium chloride flush   Vital Signs    Vitals:   06/27/19 0727 06/27/19 0730 06/27/19 0800 06/27/19 0815  BP: (!) 115/41  (!) 114/49   Pulse: 62  63 80  Resp: 17  12 (!) 24  Temp:   (!) 100.6 F (38.1 C)   TempSrc:   Axillary   SpO2: 93% 99% 91% 92%  Weight:      Height:        Intake/Output Summary (Last 24 hours) at 06/27/2019 0906 Last data  filed at 06/27/2019 0800 Gross per 24 hour  Intake 2279.86 ml  Output 1754 ml  Net 525.86 ml   Last 3 Weights 06/27/2019 06/26/2019 06/18/2019  Weight (lbs) 173 lb 15.1 oz 173 lb 11.6 oz 175 lb 0.7 oz  Weight (kg) 78.9 kg 78.8 kg 79.4 kg      Telemetry    SR - Personally Reviewed  ECG    No new - Personally Reviewed  Physical Exam   GEN: No acute distress. Intubated   Neck: No JVD Cardiac: RRR, no murmurs, rubs, or gallops.  Respiratory: rhonchi and wheezes and diminished breath sounds to auscultation bilaterally. GI: Soft, nontender, non-distended  MS: No edema; No deformity. Neuro:  Nonfocal sedated, moaves all over bed Psych: Normal affect   Labs    High Sensitivity Troponin:   Recent Labs  Lab 07/08/2019 1144 07/11/2019 1415  TROPONINIHS 26* 30*      Chemistry Recent Labs  Lab 06/25/2019 1144 06/17/2019 1144 07/06/2019 1543 06/21/19 0538 06/25/19 0510 06/26/19 0440 06/26/19 1527  NA 137   < > 140   < > 143 147* 145  K 3.5   < > 3.5   < > 3.5 3.4* 3.9  CL 99   < > 104   < > 106 106  107  CO2 24   < > 26   < > 28 33* 31  GLUCOSE 148*   < > 143*   < > 223* 156* 195*  BUN 18   < > 14   < > 24* 39* 40*  CREATININE 1.03   < > 0.91   < > 0.95 1.27* 1.10  CALCIUM 9.0   < > 8.7*   < > 8.1* 8.1* 8.2*  PROT 6.8  --  6.5  --   --   --   --   ALBUMIN 3.4*  --  3.0*  --   --   --   --   AST 20  --  23  --   --   --   --   ALT 15  --  14  --   --   --   --   ALKPHOS 68  --  64  --   --   --   --   BILITOT 1.0  --  0.9  --   --   --   --   GFRNONAA >60   < > >60   < > >60 56* >60  GFRAA >60   < > >60   < > >60 >60 >60  ANIONGAP 14   < > 10   < > 9 8 7    < > = values in this interval not displayed.     Hematology Recent Labs  Lab 06/25/19 0510 06/26/19 0440 06/27/19 0430  WBC 7.2 8.6 7.8  RBC 2.79* 2.72* 2.72*  HGB 8.8* 8.5* 8.6*  HCT 27.4* 27.5* 27.6*  MCV 98.2 101.1* 101.5*  MCH 31.5 31.3 31.6  MCHC 32.1 30.9 31.2  RDW 13.9 14.5 14.7  PLT 149* 165 187     BNP Recent Labs  Lab 06/23/19 0930 06/25/19 1330  BNP 416.1* 245.6*     DDimer No results for input(s): DDIMER in the last 168 hours.   Radiology    DG CHEST PORT 1 VIEW  Result Date: 06/27/2019 CLINICAL DATA:  Follow-up pneumothorax on the right EXAM: PORTABLE CHEST 1 VIEW COMPARISON:  Yesterday FINDINGS: Cardiomegaly and mild vascular pedicle widening. The enteric, endotracheal, and chest tubes remain in good position. Right IJ line with tip at the SVC. Unchanged diffuse pulmonary infiltrates. No visible pneumothorax on the second better position view. IMPRESSION: Stable hardware positioning and bilateral infiltrates. No convincing pneumothorax. Electronically Signed   By: Monte Fantasia M.D.   On: 06/27/2019 08:48   DG CHEST PORT 1 VIEW  Result Date: 06/26/2019 CLINICAL DATA:  Follow-up pneumothorax EXAM: PORTABLE CHEST 1 VIEW COMPARISON:  06/25/2019 FINDINGS: Cardiac shadow is stable. Endotracheal tube and gastric catheter are again seen and stable. Right jugular central line is also seen and stable. Persistent right pigtail catheter is noted. No definitive pneumothorax is seen. Patchy airspace opacities are noted stable from the prior exam. IMPRESSION: Stable bilateral airspace opacities. No recurrent pneumothorax is noted. Tubes and lines as described. Electronically Signed   By: Inez Catalina M.D.   On: 06/26/2019 13:51    Cardiac Studies   ECHO: LIMITED 06/23/2019 1. Limited definity contrast study for LVEF/wall motion.  2. Left ventricular ejection fraction, by estimation, is 45 to 50%. The  left ventricle has mildly decreased function. There is moderate  hypokinesis of the left ventricular, mid-apical inferior wall and  inferoseptal wall.   Comparison(s): No significant change from prior study. 06/21/19: LVEF  45-50%, global HK.  FINDINGS  Left Ventricle: Left ventricular ejection fraction, by estimation, is 45  to 50%. The left ventricle has mildly decreased  function. Moderate  hypokinesis of the left ventricular, mid-apical inferior wall and  inferoseptal wall. Definity contrast agent was  given IV to delineate the left ventricular endocardial borders.   Echo: 06/21/2019 1. Left ventricular ejection fraction, by estimation, is 45 to 50%. The  left ventricle has mildly decreased function. The left ventricle  demonstrates global hypokinesis. Left ventricular diastolic parameters  were normal.  2. Right ventricular systolic function is normal. The right ventricular  size is normal. There is normal pulmonary artery systolic pressure. The estimated right ventricular systolic pressure is 44.8 mmHg.  3. Left atrial size was mildly dilated.  4. The mitral valve is normal in structure. Mild to moderate mitral valve  regurgitation. No evidence of mitral stenosis.  5. The aortic valve is normal in structure. Aortic valve regurgitation is  not visualized. No aortic stenosis is present.  6. The inferior vena cava is normal in size with greater than 50%  respiratory variability, suggesting right atrial pressure of 3 mmHg.  7. Poor acoustical images limit accurate assessment of EF and wall  motion. Recommend repeat limited study with definity contrast.    Patient Profile     74 y.o. male witha hx oftobacco use, OA,HLD, alcohol use, recent 50% PTX, Afib who [facility] Rt [facility] and atrial fibrillation  Assessment & Plan    1. PAF: -Last  AF 6/7 and was placed on Amiodarone gtt with conversion to NSR   -NOW on 400 mg BID per tube. Not on anticoagulation If afib recurs would consider   Pt is now DNR there was team meeting with his daughter and son.  Will continue to optimize pulmonary status.  Did not wean well from vent today.    2.Acute systolic and diastolic HF with Mild LV dysfunction LVEF 45 to 50% on echo Volume appears increased He was 8 L positive on 06/25/19, then on  6/10 9319 cc. But today improved to 8974 CC + rec'd 80 mg lasix on 06/25/19 none yesterday  --no weights     --Na 145, Cr down to 1.10 on yesterday pm labs May need another dose of lasix today   3. ETOH use: -Currently on Precedex/intubated and sedated  4. PTX/COPD:  -CXR 06/27/19  Cardiomegaly and mild vascular pedicle widening. The enteric, endotracheal, and chest tubes remain in good position. Right IJ line with tip at the SVC. Unchanged diffuse pulmonary infiltrates. No visible pneumothorax on the second better position view. -low grade fever   -Management per trauma, primary  5. Acute respiratory failure: -Currently onVent -Management pertrauma, primary -On Precedex -Failed vent wean 06/24/19 and 06/26/19   5. Abnormal echocardiogram: -Echocardiogram performed 06/23/19 with LVEF at 45-50% and moderate hypokinesis of the LV, mid-apical inferior wall and inferoseptal walls    For questions or updates, please contact [place] Please consult [email or web address] for contact info under        Signed, Cecilie Kicks, NP  06/27/2019, 9:06 AM    Patient seen and examined  I agree with findings as noted by L Ingold above  Pt remains intubated  BP 120s/   P 70 (SR) NEck full Lungs Rhonchi, wheezes worse L  Card  RRR  No S3  Ext with tr edema   Remains in SR   I would give 400 bid today  Probably switch  to 200 bid tomorrow  Volume is a little better   I would give additional lasix today   Optimize before extubation.  Dorris Carnes MD

## 2019-06-28 DIAGNOSIS — Z515 Encounter for palliative care: Secondary | ICD-10-CM

## 2019-06-28 DIAGNOSIS — J9600 Acute respiratory failure, unspecified whether with hypoxia or hypercapnia: Secondary | ICD-10-CM

## 2019-06-28 LAB — CBC
HCT: 30.6 % — ABNORMAL LOW (ref 39.0–52.0)
Hemoglobin: 9.6 g/dL — ABNORMAL LOW (ref 13.0–17.0)
MCH: 31.7 pg (ref 26.0–34.0)
MCHC: 31.4 g/dL (ref 30.0–36.0)
MCV: 101 fL — ABNORMAL HIGH (ref 80.0–100.0)
Platelets: 231 10*3/uL (ref 150–400)
RBC: 3.03 MIL/uL — ABNORMAL LOW (ref 4.22–5.81)
RDW: 14.9 % (ref 11.5–15.5)
WBC: 9.1 10*3/uL (ref 4.0–10.5)
nRBC: 0 % (ref 0.0–0.2)

## 2019-06-28 LAB — BASIC METABOLIC PANEL
Anion gap: 9 (ref 5–15)
BUN: 38 mg/dL — ABNORMAL HIGH (ref 8–23)
CO2: 34 mmol/L — ABNORMAL HIGH (ref 22–32)
Calcium: 8.3 mg/dL — ABNORMAL LOW (ref 8.9–10.3)
Chloride: 105 mmol/L (ref 98–111)
Creatinine, Ser: 1.02 mg/dL (ref 0.61–1.24)
GFR calc Af Amer: >60 mL/min (ref >60)
GFR calc non Af Amer: >60 mL/min (ref >60)
Glucose, Bld: 223 mg/dL — ABNORMAL HIGH (ref 70–99)
Potassium: 4 mmol/L (ref 3.5–5.1)
Sodium: 148 mmol/L — ABNORMAL HIGH (ref 135–145)

## 2019-06-28 LAB — GLUCOSE, CAPILLARY
Glucose-Capillary: 168 mg/dL — ABNORMAL HIGH (ref 70–99)
Glucose-Capillary: 192 mg/dL — ABNORMAL HIGH (ref 70–99)
Glucose-Capillary: 185 mg/dL — ABNORMAL HIGH (ref 70–99)
Glucose-Capillary: 128 mg/dL — ABNORMAL HIGH (ref 70–99)
Glucose-Capillary: 124 mg/dL — ABNORMAL HIGH (ref 70–99)
Glucose-Capillary: 102 mg/dL — ABNORMAL HIGH (ref 70–99)

## 2019-06-28 LAB — TRIGLYCERIDES: Triglycerides: 130 mg/dL (ref <150)

## 2019-06-28 MED ORDER — POTASSIUM CHLORIDE CRYS ER 20 MEQ PO TBCR: 20.0000 meq | EXTENDED_RELEASE_TABLET | Freq: Every day | ORAL | Status: DC

## 2019-06-28 MED ORDER — PIVOT 1.5 CAL PO LIQD: 1000.0000 mL | ORAL | Status: DC

## 2019-06-28 MED ORDER — THIAMINE HCL 100 MG/ML IJ SOLN: 100.0000 mg | Freq: Every day | INTRAMUSCULAR | Status: DC

## 2019-06-28 MED ORDER — THIAMINE HCL 100 MG PO TABS: 100.0000 mg | ORAL_TABLET | Freq: Every day | ORAL | Status: DC

## 2019-06-28 MED ORDER — POTASSIUM CHLORIDE 20 MEQ PO PACK
20.0000 meq | PACK | Freq: Every day | ORAL | Status: DC
  Administered 2019-06-28: 20 meq
  Filled 2019-06-28: qty 1

## 2019-06-28 MED ORDER — FUROSEMIDE 10 MG/ML IJ SOLN
80.0000 mg | Freq: Two times a day (BID) | INTRAMUSCULAR | Status: DC
  Administered 2019-06-28 – 2019-06-29 (×2): 80 mg via INTRAVENOUS
  Filled 2019-06-28 (×2): qty 8

## 2019-06-28 MED ORDER — AMIODARONE HCL 200 MG PO TABS
200.0000 mg | ORAL_TABLET | Freq: Two times a day (BID) | ORAL | Status: DC
  Administered 2019-06-28: 200 mg
  Filled 2019-06-28: qty 1

## 2019-06-28 NOTE — Progress Notes (Signed)
In report this morning, was told that patient vomited overnight. Dr. Donne Hazel gave verbal order to reduce TF to 71mL/hr. Patient just vomited again. Turned off TF and set up LWIS. Notified MD.

## 2019-06-28 NOTE — Progress Notes (Signed)
Progress Note  Patient Name: Benjamin Carlson Date of Encounter: 06/28/2019  [facility] Cardiologist: Minus Breeding, MD   Subjective   Pt remains intubated  Sedated    Inpatient Medications    Scheduled Meds: . acetaminophen (TYLENOL) oral liquid 160 mg/5 mL  1,000 mg Per Tube Q6H  . amiodarone  400 mg Per Tube BID  . chlorhexidine gluconate (MEDLINE KIT)  15 mL Mouth Rinse BID  . Chlorhexidine Gluconate Cloth  6 each Topical Daily  . clonazePAM  0.5 mg Per Tube BID  . docusate  100 mg Per Tube BID  . enoxaparin (LOVENOX) injection  30 mg Subcutaneous Q12H  . feeding supplement (PIVOT 1.5 CAL)  1,000 mL Per Tube Q24H  . fluconazole  400 mg Per Tube Daily  . folic acid  1 mg Per Tube Daily  . insulin aspart  0-15 Units Subcutaneous QID  . mouth rinse  15 mL Mouth Rinse 10 times per day  . multivitamin with minerals  1 tablet Per Tube Daily  . nicotine  14 mg Transdermal Daily  . pantoprazole sodium  40 mg Per Tube Daily  . polyethylene glycol  17 g Per Tube Daily  . QUEtiapine  100 mg Per Tube BID  . sodium chloride flush  10-40 mL Intracatheter Q12H  . thiamine  100 mg Oral Daily   Or  . thiamine  100 mg Intravenous Daily   Continuous Infusions: . sodium chloride    . dexmedetomidine (PRECEDEX) IV infusion 0.8 mcg/kg/hr (06/28/19 0600)  . fentaNYL infusion INTRAVENOUS 125 mcg/hr (06/28/19 0600)  . propofol (DIPRIVAN) infusion 20 mcg/kg/min (06/28/19 0600)   PRN Meds: Place/Maintain arterial line **AND** sodium chloride, haloperidol lactate, ibuprofen, ipratropium-albuterol, metoprolol tartrate, ondansetron **OR** ondansetron (ZOFRAN) IV, oxyCODONE, sodium chloride flush   Vital Signs    Vitals:   06/28/19 0300 06/28/19 0400 06/28/19 0500 06/28/19 0600  BP: (!) 115/52 (!) 116/50 (!) 124/54 (!) 115/46  Pulse: 66 63 60 (!) 59  Resp: 12 (!) 0 (!) 0   Temp:  99.3 F (37.4 C)    TempSrc:  Oral    SpO2: 91% 91% 93% 94%  Weight:      Height:         Intake/Output Summary (Last 24 hours) at 06/28/2019 0636 Last data filed at 06/28/2019 0600 Gross per 24 hour  Intake 2524.41 ml  Output 2625 ml  Net -100.59 ml   Last 3 Weights 06/27/2019 06/26/2019 06/18/2019  Weight (lbs) 173 lb 15.1 oz 173 lb 11.6 oz 175 lb 0.7 oz  Weight (kg) 78.9 kg 78.8 kg 79.4 kg      Telemetry  SR   - Personally Reviewed  ECG    No new - Personally Reviewed  Physical Exam   GEN: No acute distress. Intubated   Neck: No JVD Cardiac: RRR, no murmurs, rubs, or gallops.  Respiratory  Diffuse rhonchi   GI: Soft, nontender, non-distended  MS: No edema; No deformity. Neuro:  Nonfocal sedated, moaves all over bed Psych: Normal affect   Labs    High Sensitivity Troponin:   Recent Labs  Lab 06/24/2019 1144 07/16/2019 1415  TROPONINIHS 26* 30*      Chemistry Recent Labs  Lab 06/26/19 0440 06/26/19 1527 06/27/19 0430  NA 147* 145 145  K 3.4* 3.9 4.0  CL 106 107 107  CO2 33* 31 31  GLUCOSE 156* 195* 190*  BUN 39* 40* 37*  CREATININE 1.27* 1.10 1.00  CALCIUM 8.1* 8.2* 7.8*  GFRNONAA 56* >60 >60  GFRAA >60 >60 >60  ANIONGAP 8 7 7      Hematology Recent Labs  Lab 06/25/19 0510 06/26/19 0440 06/27/19 0430  WBC 7.2 8.6 7.8  RBC 2.79* 2.72* 2.72*  HGB 8.8* 8.5* 8.6*  HCT 27.4* 27.5* 27.6*  MCV 98.2 101.1* 101.5*  MCH 31.5 31.3 31.6  MCHC 32.1 30.9 31.2  RDW 13.9 14.5 14.7  PLT 149* 165 187    BNP Recent Labs  Lab 06/23/19 0930 06/25/19 1330  BNP 416.1* 245.6*     DDimer No results for input(s): DDIMER in the last 168 hours.   Radiology    DG CHEST PORT 1 VIEW  Result Date: 06/27/2019 CLINICAL DATA:  Follow-up pneumothorax on the right EXAM: PORTABLE CHEST 1 VIEW COMPARISON:  Yesterday FINDINGS: Cardiomegaly and mild vascular pedicle widening. The enteric, endotracheal, and chest tubes remain in good position. Right IJ line with tip at the SVC. Unchanged diffuse pulmonary infiltrates. No visible pneumothorax on the second  better position view. IMPRESSION: Stable hardware positioning and bilateral infiltrates. No convincing pneumothorax. Electronically Signed   By: Monte Fantasia M.D.   On: 06/27/2019 08:48   DG CHEST PORT 1 VIEW  Result Date: 06/26/2019 CLINICAL DATA:  Follow-up pneumothorax EXAM: PORTABLE CHEST 1 VIEW COMPARISON:  06/25/2019 FINDINGS: Cardiac shadow is stable. Endotracheal tube and gastric catheter are again seen and stable. Right jugular central line is also seen and stable. Persistent right pigtail catheter is noted. No definitive pneumothorax is seen. Patchy airspace opacities are noted stable from the prior exam. IMPRESSION: Stable bilateral airspace opacities. No recurrent pneumothorax is noted. Tubes and lines as described. Electronically Signed   By: Inez Catalina M.D.   On: 06/26/2019 13:51    Cardiac Studies   ECHO: LIMITED 06/23/2019 1. Limited definity contrast study for LVEF/wall motion.  2. Left ventricular ejection fraction, by estimation, is 45 to 50%. The  left ventricle has mildly decreased function. There is moderate  hypokinesis of the left ventricular, mid-apical inferior wall and  inferoseptal wall.   Comparison(s): No significant change from prior study. 06/21/19: LVEF  45-50%, global HK.   FINDINGS  Left Ventricle: Left ventricular ejection fraction, by estimation, is 45  to 50%. The left ventricle has mildly decreased function. Moderate  hypokinesis of the left ventricular, mid-apical inferior wall and  inferoseptal wall. Definity contrast agent was  given IV to delineate the left ventricular endocardial borders.   Echo: 06/21/2019 1. Left ventricular ejection fraction, by estimation, is 45 to 50%. The  left ventricle has mildly decreased function. The left ventricle  demonstrates global hypokinesis. Left ventricular diastolic parameters  were normal.  2. Right ventricular systolic function is normal. The right ventricular  size is normal. There is normal  pulmonary artery systolic pressure. The estimated right ventricular systolic pressure is 80.2 mmHg.  3. Left atrial size was mildly dilated.  4. The mitral valve is normal in structure. Mild to moderate mitral valve  regurgitation. No evidence of mitral stenosis.  5. The aortic valve is normal in structure. Aortic valve regurgitation is  not visualized. No aortic stenosis is present.  6. The inferior vena cava is normal in size with greater than 50%  respiratory variability, suggesting right atrial pressure of 3 mmHg.  7. Poor acoustical images limit accurate assessment of EF and wall  motion. Recommend repeat limited study with definity contrast.    Patient Profile     75 y.o. male witha hx oftobacco use, OA,HLD, alcohol  use, recent 50% PTX, Afib who [facility] Rt [facility]X and atrial fibrillation  Assessment & Plan    1. PAF: Remains in SR   Will switch to 200 bid amiodarone  Not on anticoagulation  If afib recurs would consier    2.Acute systolic and diastolic HF with Mild LV dysfunction LVEF 45 to 50% on echo   Would give IV lasix today bid  Along with KCL     4.Pulmonary PTX/COPD:  Remains intubated   CXR relatively unchanged        For questions or updates, please contact [place] Please consult [email or web address] for contact info under        Signed, Dorris Carnes, MD  06/28/2019, 6:36 AM

## 2019-06-28 NOTE — Progress Notes (Signed)
Came into room and tube was 22 at lip.  Checked chart and every time it was at 24 as well as when he was intubated.  Moved tube back to 24 at lip.  Patient started to desat, gave O2 breath and patient began to come back up.  Checked breath sounds, same on each side.  Will continue to monitor.

## 2019-06-28 NOTE — Progress Notes (Signed)
Daily Progress Note   Patient Name: Benjamin Carlson       Date: 06/28/2019 DOB: [birth year]  Age: 74 y.o. MRN#: [id number] Attending Physician: Particia Jasper, MD Primary Care Physician: Patient, No Pcp Per Admit Date: 07/05/2019  Reason for Consultation/Follow-up: emotional support and to discuss complex medical decision making related to patient's goals of care  Subjective: Spoke with daughter Lenna Sciara.  She described her father's improvement followed by the two episodes of vomiting and the need for full vent support once again.    She has done a great job of gathering family including that patient's brother who has not seen him for over 30 years.    Melissa understands her father is likely nearing end of life. "we have to make the decision, my father would want it".  I attempted to provide time and space for her to express her grief.   Assessment: Plan per attending team is for 1 way extubation on 6/13.   Patient Profile/HPI:  74 y.o. male   admitted on 07/05/2019 with a past medical history significant for HLD who was bending over to pick something up several days ago and felt a pop in his chest.  He does not remember a fall or injury.Per EMR he developed SOB and went to [facility] where he was admitted for a 50% PTX. He had a CT placed by Dr. Constance Haw. He apparently went into a fib yesterday, but pulled his CT out and left AMA. ( family question this information)  He returns to [facility] today for worsening SOB. He denies CP or anything other complaints. A CXR reveals a 100% PTX. He has no rib fractures.He does have blebs noted, likely from undiagnosed COPD given his tobacco abuse history. Echocardiogram from 06/23/2019 significant for decreased left ventricular EF of 45 to 50%. Chest x-ray from 06/24/2019  significant for right apical pneumothorax with improved bilateral patchy airspace opacities with persistent subcutaneous emphysema. Patient is currently intubated and remains sedated on Precedex.  Family faces treatment option decisions, advanced directive decisions and anticipatory care needs.     Length of Stay: 8   Vital Signs: BP (!) 116/52    Pulse 61    Temp 99.4 F (37.4 C) (Axillary)    Resp 18    Ht 6\' 2"  (1.88 m)    Wt 78.9  kg    SpO2 95%    BMI 22.33 kg/m  SpO2: SpO2: 95 % O2 Device: O2 Device: Ventilator O2 Flow Rate: O2 Flow Rate (L/min): 10 L/min       Palliative Assessment/Data: 10%     Palliative Care Plan    Recommendations/Plan: PMT will continue to support family and medical team  Code Status:  DNR  Prognosis:  Hours - Days   Discharge Planning: To Be Determined  Hospital death vs Hospice House.  Thank you for allowing the Palliative Medicine Team to assist in the care of this patient.  Total time spent:  15 min.     Greater than 50%  of this time was spent counseling and coordinating care related to the above assessment and plan.  Florentina Jenny, PA-C Palliative Medicine  Please contact Palliative MedicineTeam phone at [phone] for questions and concerns between 7 am - 7 pm.   Please see AMION for individual provider pager numbers.

## 2019-06-28 NOTE — Progress Notes (Signed)
Subjective/Chief Complaint: Unchanged, unresponsive   Objective: Vital signs in last 24 hours: Temp:  [99.3 F (37.4 C)-102.1 F (38.9 C)] 99.3 F (37.4 C) (06/12 0400) Pulse Rate:  [59-77] 61 (06/12 0800) Resp:  [0-28] 0 (06/12 0500) BP: (114-137)/(43-59) 131/54 (06/12 0800) SpO2:  [90 %-96 %] 96 % (06/12 0800) FiO2 (%):  [40 %] 40 % (06/12 0400) Last BM Date: 06/18/19  Intake/Output from previous day: 06/11 0701 - 06/12 0700 In: 2404 [I.V.:1084; NG/GT:1320] Out: 2625 [Urine:2625] Intake/Output this shift: No intake/output data recorded.  Gen: comfortable, no distress HEENT: intubated CV: RRR Pulm: coarse bilateral breath sounds, mechanically ventilated Abd: soft, nontender Extr:, no edema Neuro: grossly non-focal, does not follow commands  Lab Results:  Recent Labs    06/26/19 0440 06/27/19 0430  WBC 8.6 7.8  HGB 8.5* 8.6*  HCT 27.5* 27.6*  PLT 165 187   BMET Recent Labs    06/26/19 1527 06/27/19 0430  NA 145 145  K 3.9 4.0  CL 107 107  CO2 31 31  GLUCOSE 195* 190*  BUN 40* 37*  CREATININE 1.10 1.00  CALCIUM 8.2* 7.8*   PT/INR No results for input(s): LABPROT, INR in the last 72 hours. ABG No results for input(s): PHART, HCO3 in the last 72 hours.  Invalid input(s): PCO2, PO2  Studies/Results: DG CHEST PORT 1 VIEW  Result Date: 06/27/2019 CLINICAL DATA:  Follow-up pneumothorax on the right EXAM: PORTABLE CHEST 1 VIEW COMPARISON:  Yesterday FINDINGS: Cardiomegaly and mild vascular pedicle widening. The enteric, endotracheal, and chest tubes remain in good position. Right IJ line with tip at the SVC. Unchanged diffuse pulmonary infiltrates. No visible pneumothorax on the second better position view. IMPRESSION: Stable hardware positioning and bilateral infiltrates. No convincing pneumothorax. Electronically Signed   By: Monte Fantasia M.D.   On: 06/27/2019 08:48   DG CHEST PORT 1 VIEW  Result Date: 06/26/2019 CLINICAL DATA:  Follow-up  pneumothorax EXAM: PORTABLE CHEST 1 VIEW COMPARISON:  06/25/2019 FINDINGS: Cardiac shadow is stable. Endotracheal tube and gastric catheter are again seen and stable. Right jugular central line is also seen and stable. Persistent right pigtail catheter is noted. No definitive pneumothorax is seen. Patchy airspace opacities are noted stable from the prior exam. IMPRESSION: Stable bilateral airspace opacities. No recurrent pneumothorax is noted. Tubes and lines as described. Electronically Signed   By: Inez Catalina M.D.   On: 06/26/2019 13:51    Anti-infectives: Anti-infectives (From admission, onward)   Start     Dose/Rate Route Frequency Ordered Stop   06/27/19 1000  fluconazole (DIFLUCAN) tablet 400 mg     Discontinue     400 mg Per Tube Daily 06/27/19 0752 07/03/19 0959   06/26/19 1000  fluconazole (DIFLUCAN) tablet 200 mg  Status:  Discontinued        200 mg Per Tube Daily 06/26/19 0941 06/27/19 0752   06/21/19 1600  azithromycin (ZITHROMAX) 500 mg in sodium chloride 0.9 % 250 mL IVPB        500 mg 250 mL/hr over 60 Minutes Intravenous Every 24 hours 06/21/19 1412 06/25/19 1658   06/21/19 1500  cefTRIAXone (ROCEPHIN) 1 g in sodium chloride 0.9 % 100 mL IVPB  Status:  Discontinued        1 g 200 mL/hr over 30 Minutes Intravenous Every 24 hours 06/21/19 1412 06/25/19 1518      Assessment/Plan:  ?Fall  R PTX-s/pCT,no PTX andnoairleak, WS6/9, removed A fib with RVR, new onset-Amio per Cardiology HLD- home meds  ETOH use-CIWA COPD -pulmicort Acute hypoxic ventilator dependent respiratory failure-full support, weaning, plan is for one way extubation tomorrow per family, there is brother trying to see him Tobacco abuse Fever, PNA -completedrocephin/azithromycin for CAP.Resp cxgrowing yeast so add Diflucan and plan 7d Encephalopathy -likely2/2 alcohol withdrawal.  FEN -TF decreased due to emesis, will stop at mn due to likely extubation tomorrow. Precedex,  seroquel,clonazepam VTE-SCDs, lovenox Dispo- ICU  Cc time 22 minutes Rolm Bookbinder 06/28/2019

## 2019-06-29 DIAGNOSIS — Z515 Encounter for palliative care: Secondary | ICD-10-CM

## 2019-06-29 LAB — GLUCOSE, CAPILLARY
Glucose-Capillary: 127 mg/dL — ABNORMAL HIGH (ref 70–99)
Glucose-Capillary: 122 mg/dL — ABNORMAL HIGH (ref 70–99)

## 2019-06-29 MED ORDER — SODIUM CHLORIDE 0.9% FLUSH
3.0000 mL | Freq: Two times a day (BID) | INTRAVENOUS | Status: DC
  Administered 2019-06-29: 3 mL via INTRAVENOUS

## 2019-06-29 MED ORDER — GLYCOPYRROLATE 0.2 MG/ML IJ SOLN: 0.2000 mg | INTRAMUSCULAR | Status: DC | PRN

## 2019-06-29 MED ORDER — ONDANSETRON HCL 4 MG/2ML IJ SOLN: 4.0000 mg | Freq: Four times a day (QID) | INTRAMUSCULAR | Status: DC | PRN

## 2019-06-29 MED ORDER — HALOPERIDOL LACTATE 2 MG/ML PO CONC
0.5000 mg | ORAL | Status: DC | PRN
  Filled 2019-06-29: qty 0.3

## 2019-06-29 MED ORDER — GLYCOPYRROLATE 0.2 MG/ML IJ SOLN
0.4000 mg | Freq: Three times a day (TID) | INTRAMUSCULAR | Status: DC
  Administered 2019-06-29: 0.4 mg via INTRAVENOUS
  Filled 2019-06-29: qty 2

## 2019-06-29 MED ORDER — LORAZEPAM 2 MG/ML IJ SOLN: 1.0000 mg | INTRAMUSCULAR | Status: DC | PRN

## 2019-06-29 MED ORDER — DIPHENHYDRAMINE HCL 50 MG/ML IJ SOLN: 25.0000 mg | INTRAMUSCULAR | Status: DC | PRN

## 2019-06-29 MED ORDER — LORAZEPAM 2 MG/ML IJ SOLN
2.0000 mg | INTRAMUSCULAR | Status: DC | PRN
  Administered 2019-06-29: 2 mg via INTRAVENOUS

## 2019-06-29 MED ORDER — POLYVINYL ALCOHOL 1.4 % OP SOLN: 1.0000 [drp] | Freq: Four times a day (QID) | OPHTHALMIC | Status: DC | PRN

## 2019-06-29 MED ORDER — HYDROMORPHONE BOLUS VIA INFUSION
2.0000 mg | INTRAVENOUS | Status: DC | PRN
  Administered 2019-06-29 (×3): 2 mg via INTRAVENOUS
  Filled 2019-06-29: qty 2

## 2019-06-29 MED ORDER — HALOPERIDOL 0.5 MG PO TABS
0.5000 mg | ORAL_TABLET | ORAL | Status: DC | PRN
  Filled 2019-06-29: qty 1

## 2019-06-29 MED ORDER — POLYVINYL ALCOHOL 1.4 % OP SOLN
1.0000 [drp] | Freq: Four times a day (QID) | OPHTHALMIC | Status: DC | PRN
  Filled 2019-06-29: qty 15

## 2019-06-29 MED ORDER — ACETAMINOPHEN 325 MG PO TABS: 650.0000 mg | ORAL_TABLET | Freq: Four times a day (QID) | ORAL | Status: DC | PRN

## 2019-06-29 MED ORDER — HALOPERIDOL LACTATE 5 MG/ML IJ SOLN: 0.5000 mg | INTRAMUSCULAR | Status: DC | PRN

## 2019-06-29 MED ORDER — SODIUM CHLORIDE 0.9 % IV SOLN: 250.0000 mL | INTRAVENOUS | Status: DC | PRN

## 2019-06-29 MED ORDER — LORAZEPAM 2 MG/ML PO CONC: 1.0000 mg | ORAL | Status: DC | PRN

## 2019-06-29 MED ORDER — ONDANSETRON 4 MG PO TBDP: 4.0000 mg | ORAL_TABLET | Freq: Four times a day (QID) | ORAL | Status: DC | PRN

## 2019-06-29 MED ORDER — SODIUM CHLORIDE 0.9 % IV SOLN
2.0000 mg/h | INTRAVENOUS | Status: DC
  Administered 2019-06-29: 2 mg/h via INTRAVENOUS
  Filled 2019-06-29: qty 2.5

## 2019-06-29 MED ORDER — LORAZEPAM 1 MG PO TABS: 1.0000 mg | ORAL_TABLET | ORAL | Status: DC | PRN

## 2019-06-29 MED ORDER — HYDROMORPHONE BOLUS VIA INFUSION
1.0000 mg | INTRAVENOUS | Status: DC | PRN
  Filled 2019-06-29: qty 2

## 2019-06-29 MED ORDER — GLYCOPYRROLATE 1 MG PO TABS
1.0000 mg | ORAL_TABLET | ORAL | Status: DC | PRN
  Filled 2019-06-29: qty 1

## 2019-06-29 MED ORDER — PROPOFOL 1000 MG/100ML IV EMUL
5.0000 ug/kg/min | INTRAVENOUS | Status: DC
  Administered 2019-06-29: 50 ug/kg/min via INTRAVENOUS

## 2019-06-29 MED ORDER — LORAZEPAM 2 MG/ML IJ SOLN
2.0000 mg | Freq: Once | INTRAMUSCULAR | Status: AC
  Administered 2019-06-29: 2 mg via INTRAVENOUS
  Filled 2019-06-29: qty 1

## 2019-06-29 MED ORDER — ACETAMINOPHEN 650 MG RE SUPP: 650.0000 mg | Freq: Four times a day (QID) | RECTAL | Status: DC | PRN

## 2019-06-29 MED ORDER — BIOTENE DRY MOUTH MT LIQD: 15.0000 mL | OROMUCOSAL | Status: DC | PRN

## 2019-06-29 MED ORDER — SODIUM CHLORIDE 0.9% FLUSH: 3.0000 mL | INTRAVENOUS | Status: DC | PRN

## 2019-06-30 LAB — CULTURE, RESPIRATORY W GRAM STAIN: Culture: NORMAL

## 2019-07-02 LAB — CULTURE, BLOOD (ROUTINE X 2)
Culture: NO GROWTH
Culture: NO GROWTH
Special Requests: ADEQUATE

## 2019-07-17 NOTE — Progress Notes (Signed)
Daily Progress Note   Patient Name: Benjamin Carlson       Date: 2019/07/17 DOB: Nov 02, 1945  Age: 74 y.o. MRN#: [id number] Attending Physician: Particia Jasper, MD Primary Care Physician: Patient, No Pcp Per Admit Date: 06/27/2019  Reason for Consultation/Follow-up: To discuss complex medical decision making related to patient's goals of care  Discussed with Drs Bobbye Morton and Donne Hazel.  Discussed with ICU RN Candace Cruise.  Subjective: Daughter Benjamin Carlson is at bedside.  Greeted and reflected for a moment about her Dad.  She indicated that he would not want to be like this.  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  Always up and on the go.  Always doing for others.  I attempted to provide reassurance.  Met with HCPOA Benjamin Carlson out in the waiting area.  Had the good fortune to meet the rest of the children and their spouses.  Benjamin Carlson shared a loving picture of her father holding her grand daughter.  He appears to be such a loving person.  Benjamin Carlson reflected about her father.  Looking at pictures of her family helps her cope.  We briefly discussed timing and what the family's needs were with regards to being present at time of extubation.  Benjamin Carlson and Benjamin Carlson ask that we call [place] after the extubation is complete.  Family will want to be with him afterward.  I explained that every human is different after extubation - some people go quickly and others linger with [place] for days.  We do not know what to expect with Mr. Hellberg.  Benjamin Carlson expressed understanding.  Benjamin Carlson does not want him to be alone overnight.  I responded that she will be able to stay with him if he is still with [place].   Assessment: Strong independent man with previously undiagnosed advanced COPD and a degree of heart failure.   Suffered pneumothorax which lead to acute respiratory failure and intubation.  Was improving but unfortunately vomited tube feeds yesterday and vomited - necessitating full ventilator support once again.   Independent man - would not want tracheostomy or long term vent support.  Would not want to "live on machines".  Per Agricultural consultant and ICU RN patient has had significant agitation requiring propofol. Previously he was able to wean from ventilator while on propofol.  However  when attempts were made to wean propofol patient became very uncomfortable and agitated.  This is an extubation to comfort.  Will leave propofol in place until extubation.  Patient Profile/HPI:  74 y.o. male   admitted on 06/23/2019 with a past medical history significant for HLD who was bending over to pick something up several days ago and felt a pop in his chest.  He does not remember a fall or injury.Per EMR he developed SOB and went to [facility] where he was admitted for a 50% PTX. He had a CT placed by Dr. Constance Haw. He apparently went into a fib yesterday, but pulled his CT out and left AMA. ( family question this information)  He returns to [facility] today for worsening SOB. He denies CP or anything other complaints. A CXR reveals a 100% PTX. He has no rib fractures. He does have blebs noted, likely from undiagnosed COPD given his tobacco abuse history.   Echocardiogram from 06/23/2019 significant for decreased left ventricular EF of 45 to 50%. Chest x-ray from 06/24/2019 significant for right apical pneumothorax with improved bilateral patchy airspace opacities with persistent subcutaneous emphysema.    Length of Stay: 9   Vital Signs: BP (!) 128/52   Pulse 61   Temp (!) 101 F (38.3 C) (Axillary)   Resp 16   Ht 6' 2"  (1.88 m)   Wt 78.9 kg   SpO2 95%   BMI 22.33 kg/m  SpO2: SpO2: 95 % O2 Device: O2 Device: Ventilator O2 Flow Rate: O2 Flow Rate (L/min): 10 L/min       Palliative Assessment/Data: 10%    Palliative  Care Plan    Recommendations/Plan: Orders adjusted.  Discontinued any orders not focused on comfort. Initiated orders for comfort and symptom management.  Family asked to be called after extubation is completed.  Phone number given to [facility].  Code Status:  DNR  Prognosis:  minutes to days.  Discharge Planning: Anticipated Hospital Death  Care plan was discussed with MDs, RN, Charge RN, family.  Thank you for allowing the Palliative Medicine Team to assist in the care of this patient.  Total time spent:  60 min.     Greater than 50%  of this time was spent counseling and coordinating care related to the above assessment and plan.  Florentina Jenny, PA-C Palliative Medicine  Please contact Palliative MedicineTeam phone at [phone] for questions and concerns between 7 am - 7 pm.   Please see AMION for individual provider pager numbers.

## 2019-07-17 NOTE — Progress Notes (Signed)
Patient's secretions were so thick they would not come through ballard tube.  Was also meeting resistance when suctioning.  Lavaged patient, was able to get copious amount of secretions out as well as suctioning is now a lot smoother.  Will continue to monitor.

## 2019-07-17 NOTE — Progress Notes (Signed)
RT note- Patient was extubated room air with comfort measures.

## 2019-07-17 NOTE — Accreditation Note (Signed)
o Restraints not reported to CMS Pursuant to regulation 482.13 (G) (3) use of soft wrist restraints was logged 

## 2019-07-17 NOTE — Death Summary Note (Signed)
DEATH SUMMARY   Patient Details  Name: Benjamin Carlson MRN: [id number] DOB: 1945/03/09  Admission/Discharge Information   Admit Date:  07/12/19  Date of Death: Date of Death: 2019/07/21  Time of Death: Time of Death: May 01, 1030  Length of Stay: 2022/04/16  Referring Physician: Patient, No Pcp Per   Reason(s) for Hospitalization  Pneumothorax  Diagnoses  Preliminary cause of death:  Secondary Diagnoses (including complications and co-morbidities):  Active Problems:   Pneumothorax on right   Malnutrition of moderate degree   Respiratory failure ([place])   Palliative care by specialist   DNR (do not resuscitate)   Agitation   Palliative care encounter   Comfort measures only status   Brief Hospital Course (including significant findings, care, treatment, and services provided and events leading to death)  Benjamin Carlson is a 74 y.o. year old male who had a pneumothorax s/p chest tube placement who had a respiratory decline resulting in intubation. His family elected for compassionate extubation based on his aforementioned wishes.     Pertinent Labs and Studies  Significant Diagnostic Studies DG Chest 1 View  Result Date: 06/19/2019 CLINICAL DATA:  Pneumothorax. EXAM: CHEST  1 VIEW 4:21 p.m. COMPARISON:  06/19/2019 at 7:49 a.m. and chest CT dated 06/19/2019 at 12:17 p.m. FINDINGS: The right-sided chest tube is been removed. Extensive subcutaneous emphysema is unchanged. Heart size and vascularity are normal. Mediastinal emphysema is also unchanged. There is a tiny peripheral right pneumothorax which is unchanged since the prior CT scan. No infiltrates or effusions.  No acute bone abnormality. IMPRESSION: 1. No change in the tiny peripheral right pneumothorax after chest tube removal. 2. No change in the extensive subcutaneous and mediastinal emphysema. Electronically Signed   By: Lorriane Shire M.D.   On: 06/19/2019 16:52   DG Chest 2 View  Result Date: 06/19/2019 CLINICAL DATA:  Pneumothorax follow-up  EXAM: CHEST - 2 VIEW COMPARISON:  Yesterday FINDINGS: Right-sided chest tube in similar position. New pneumomediastinum and increased right pneumothorax, now seen at the base. Extensive chest wall emphysema. A left apical lucency may be extrapleural gas given constellation of findings, or trace pneumothorax. Normal heart size. IMPRESSION: 1. Increased right pneumothorax, now also seen at the base. 2. New pneumomediastinum and extensive chest wall emphysema. 3. Lucency at the left apex could be extrapleural or pneumothorax, consider short follow-up. Electronically Signed   By: Monte Fantasia M.D.   On: 06/19/2019 10:29   DG Ribs Unilateral Right  Result Date: 06/19/2019 CLINICAL DATA:  Chest tube EXAM: RIGHT RIBS - 2 VIEW COMPARISON:  Chest x-ray from the same time in yesterday FINDINGS: Known right pneumothorax and pneumomediastinum. A right chest tube is in place. No visible rib fracture or erosion, but limited by the degree of soft tissue emphysema. IMPRESSION: Negative right rib series.  Referenced contemporaneous chest x-ray. Electronically Signed   By: Monte Fantasia M.D.   On: 06/19/2019 10:36   CT HEAD WO CONTRAST  Result Date: 06/21/2019 CLINICAL DATA:  Encephalopathy.  Additional provided: Pneumothorax EXAM: CT HEAD WITHOUT CONTRAST TECHNIQUE: Contiguous axial images were obtained from the base of the skull through the vertex without intravenous contrast. COMPARISON:  No pertinent prior studies available for comparison. FINDINGS: Brain: Ill-defined hypoattenuation within the cerebral white is nonspecific, but consistent with chronic small vessel ischemic disease. There is no acute intracranial hemorrhage. No demarcated cortical infarct. No extra-axial fluid collection. No evidence of intracranial mass. No midline shift. Vascular: No hyperdense vessel.  Atherosclerotic calcifications Skull: Normal. Negative for  fracture or focal lesion. Sinuses/Orbits: Visualized orbits show no acute finding.  Paranasal sinus mucosal thickening greatest within the left frontal sinus and bilateral ethmoid air cells. No significant mastoid effusion. Other: There is prominent multifocal subcutaneous gas within the visualized upper neck and maxillofacial soft tissues. IMPRESSION: No evidence of acute intracranial abnormality. Chronic small vessel ischemic changes within the cerebral white matter. Prominent multifocal subcutaneous gas within the visualized upper neck and maxillofacial soft tissues. Paranasal sinus mucosal thickening. Electronically Signed   By: Kellie Simmering DO   On: 06/21/2019 17:33   CT CHEST WO CONTRAST  Result Date: 06/19/2019 CLINICAL DATA:  Pneumothorax, chest tube placement, subsequent chest wall emphysema and pneumomediastinum EXAM: CT CHEST WITHOUT CONTRAST TECHNIQUE: Multidetector CT imaging of the chest was performed following the standard protocol without IV contrast. Sagittal and coronal MPR images reconstructed from axial data set. 06/19/2019 COMPARISON:  Chest radiograph FINDINGS: Cardiovascular: Atherosclerotic calcifications aorta and coronary arteries. Aorta normal caliber. Heart unremarkable. Mediastinum/Nodes: Extensive mediastinal pneumatosis. Extensive chest wall pneumatosis RIGHT greater than LEFT. Extension of chest wall and mediastinal pneumatosis into the inferior cervical regions bilaterally. Base of cervical region otherwise normal appearance. Esophagus unremarkable. Lungs/Pleura: Pigtail RIGHT thoracostomy tube at the anterior mid chest. Persistent small RIGHT pneumothorax. Scattered subpleural blebs greater on RIGHT. Minimal central peribronchial thickening. Subpleural pneumatosis bilaterally. Subpleural pneumatosis LEFT apex, without definite LEFT apex pneumothorax. No endobronchial lesions. Small focus of ground-glass infiltrate in anterior LEFT upper lobe/lingula. Mild compressive atelectasis RIGHT lower lobe. No significant pleural effusion or pulmonary mass. Upper Abdomen:  Visualized upper abdomen unremarkable Musculoskeletal: No acute osseous findings. IMPRESSION: Persistent small RIGHT pneumothorax despite pigtail thoracostomy tube. Extensive chest wall, mediastinal, and cervical pneumatosis, greater on RIGHT. Scattered subpleural blebs especially at apices with small focus of ground-glass infiltrate in anterior LEFT upper lobe/lingula. Atherosclerotic calcifications including coronary arteries. Aortic Atherosclerosis (ICD10-I70.0). Electronically Signed   By: Lavonia Dana M.D.   On: 06/19/2019 12:43   DG CHEST PORT 1 VIEW  Result Date: 06/27/2019 CLINICAL DATA:  Follow-up pneumothorax on the right EXAM: PORTABLE CHEST 1 VIEW COMPARISON:  Yesterday FINDINGS: Cardiomegaly and mild vascular pedicle widening. The enteric, endotracheal, and chest tubes remain in good position. Right IJ line with tip at the SVC. Unchanged diffuse pulmonary infiltrates. No visible pneumothorax on the second better position view. IMPRESSION: Stable hardware positioning and bilateral infiltrates. No convincing pneumothorax. Electronically Signed   By: Monte Fantasia M.D.   On: 06/27/2019 08:48   DG CHEST PORT 1 VIEW  Result Date: 06/26/2019 CLINICAL DATA:  Follow-up pneumothorax EXAM: PORTABLE CHEST 1 VIEW COMPARISON:  06/25/2019 FINDINGS: Cardiac shadow is stable. Endotracheal tube and gastric catheter are again seen and stable. Right jugular central line is also seen and stable. Persistent right pigtail catheter is noted. No definitive pneumothorax is seen. Patchy airspace opacities are noted stable from the prior exam. IMPRESSION: Stable bilateral airspace opacities. No recurrent pneumothorax is noted. Tubes and lines as described. Electronically Signed   By: Inez Catalina M.D.   On: 06/26/2019 13:51   DG CHEST PORT 1 VIEW  Result Date: 06/25/2019 CLINICAL DATA:  74 year old male with history of respiratory failure. EXAM: PORTABLE CHEST 1 VIEW COMPARISON:  Chest x-ray 06/24/2019. FINDINGS: An  endotracheal tube is in place with tip 5.5 cm above the carina. There is a right-sided internal jugular central venous catheter with tip terminating in the distal superior vena cava. A nasogastric tube is seen extending into the stomach, however, the tip of the nasogastric tube  extends below the lower margin of the image. Right-sided chest tube in position with pigtail reformed in the lateral right upper hemithorax. No appreciable pneumothorax identified. Diffuse interstitial prominence noted throughout all aspects of the lungs bilaterally. Patchy ill-defined airspace disease also noted scattered throughout the lungs bilaterally, without frank confluence. No pleural effusions. Pulmonary vasculature is partially obscured. Heart size is upper limits of normal. Upper mediastinal contours are within normal limits. Small amount of subcutaneous emphysema in the right lateral chest wall. IMPRESSION: 1. Support apparatus, as above. 2. Diffuse interstitial and airspace disease throughout the lungs bilaterally, similar to the prior study, presumably of infectious or inflammatory etiology. 3. No definite residual right-sided pneumothorax. Electronically Signed   By: Vinnie Langton M.D.   On: 06/25/2019 08:44   DG Chest Port 1 View  Result Date: 06/24/2019 CLINICAL DATA:  Lung crackles. EXAM: PORTABLE CHEST 1 VIEW COMPARISON:  Most recent radiograph yesterday.  CT 06/19/2019 FINDINGS: Endotracheal tube tip 4.8 cm from the carina. Enteric tube remains in place with tip below the diaphragm. Right central line in place. Right pigtail catheter in place. Possible tiny apical pneumothorax visualized below the second rib versus apical blebs. Subcutaneous emphysema in the right again seen. Improvement in patchy bilateral airspace opacities, residual is greatest at the bases. Unchanged heart size and mediastinal contours. No significant pleural fluid. IMPRESSION: 1. Possible tiny right apical pneumothorax. Right pigtail catheter in  place. 2. Improvement in bilateral patchy airspace opacities since yesterday, greatest residual at the bases. 3. Persistent subcutaneous emphysema in the right chest wall. Electronically Signed   By: Keith Rake M.D.   On: 06/24/2019 21:14   DG Chest Port 1 View  Result Date: 06/23/2019 CLINICAL DATA:  Respiratory failure with central catheter placement EXAM: PORTABLE CHEST 1 VIEW COMPARISON:  June 23, 2019 study obtained earlier in the day FINDINGS: Endotracheal tube tip is 5.4 cm above the carina. Nasogastric tube tip and side port are below the diaphragm with side port seen in the stomach. There is now a central catheter with tip in the superior vena cava. There is a chest tube on the right. There is subcutaneous air on the right but no appreciable pneumothorax. There is extensive airspace opacity bilaterally, similar to earlier in the day. No new opacity evident. Heart upper normal in size with pulmonary vascularity normal. No adenopathy appreciable. No evident bone lesions. IMPRESSION: Tube and catheter positions as described without pneumothorax. Widespread airspace opacity persists. Subcutaneous air again noted in the right. Stable cardiac silhouette. Electronically Signed   By: Lowella Grip III M.D.   On: 06/23/2019 16:59   DG CHEST PORT 1 VIEW  Result Date: 06/23/2019 CLINICAL DATA:  Endotracheal tube placement. EXAM: PORTABLE CHEST 1 VIEW COMPARISON:  Same day at 0540 hours and CT chest 06/19/2019. FINDINGS: Endotracheal tube terminates 6.1 cm above the carina. Nasogastric tube terminates in the stomach. Heart size stable. Severe diffuse airspace opacification persists. Pigtail catheter is seen in the lateral aspect of the apical right hemithorax. No definite pneumothorax. Subcutaneous emphysema in the neck bilaterally and right chest wall. No pleural fluid. IMPRESSION: 1. Satisfactory endotracheal tube position. 2. Severe diffuse bilateral airspace opacification may be due to edema or  pneumonia. Adult respiratory distress syndrome is also considered, in the appropriate clinical setting. 3. Right chest tube in place.  No definite pneumothorax. Electronically Signed   By: Lorin Picket M.D.   On: 06/23/2019 10:48   DG Chest Port 1 View  Result Date: 06/23/2019 CLINICAL DATA:  Cough, chest tube EXAM: PORTABLE CHEST 1 VIEW COMPARISON:  Radiograph 07/03/2019, CT 06/19/2019 FINDINGS: Right apically directed pigtail pleural drain is in similar position to comparison accounting for differences in technique. Small amount of residual pleural gas is seen towards the right lung apex. Increasing heterogeneous airspace opacity throughout both lungs which is markedly increased from the CT exam but grossly similar to more recent comparison radiography. Minimal residual pneumomediastinum, certainly less conspicuous than comparison exams. Remaining cardiomediastinal contours are unchanged. Improving subcutaneous emphysema. Degenerative changes are present in the imaged spine and shoulders. Telemetry leads overlie the chest. IMPRESSION: 1. Stable position of right apically directed pleural drain. Small amount of residual pleural gas towards the right lung apex. 2. Persistent heterogeneous airspace opacity throughout both lungs which is markedly increased from the CT exam but grossly similar to more recent comparison radiography. 3. Extensive subcutaneous emphysema and pneumomediastinum, improving from comparison. Electronically Signed   By: Lovena Le M.D.   On: 06/23/2019 06:00   DG CHEST PORT 1 VIEW  Result Date: 06/22/2019 CLINICAL DATA:  Congested/ shob Patient had had a fall and developed some chest discomfort, went to [facility] where it was discovered that he had a 50% pneumothorax, chest tube was placed. EXAM: PORTABLE CHEST - 1 VIEW COMPARISON:  the previous day's study FINDINGS: Right pigtail chest tube remains directed laterally towards the apex. There is a trace lateral pneumothorax.  Moderate right lateral subcutaneous emphysema slightly improved. Extensive patchy airspace infiltrates throughout both lungs with relative sparing at the periphery of the bases. Heart size and mediastinal contours are within normal limits. No effusion. Visualized bones unremarkable. IMPRESSION: 1. Stable right chest tube with trace pneumothorax. 2. Persistent bilateral airspace disease Electronically Signed   By: Lucrezia Europe M.D.   On: 06/22/2019 10:44   DG Chest Port 1 View  Result Date: 06/21/2019 CLINICAL DATA:  Follow-up pneumothorax and extensive subcutaneous emphysema. Right chest tube. EXAM: PORTABLE CHEST 1 VIEW COMPARISON:  06/17/2019 and older studies. FINDINGS: Right lateral upper hemithorax chest tube is stable. No visualized pneumothorax. No pleural effusion. Bilateral airspace lung opacities appear increased from the prior study. These may reflect infection or atelectasis. Pulmonary edema is a less likely a possibility. Extensive subcutaneous emphysema is similar to the previous chest radiograph. Pneumomediastinum is less apparent. IMPRESSION: 1. Worsened lung aeration with new or increased airspace lung opacities bilaterally, which may reflect infection or atelectasis, or less likely pulmonary edema. 2. No visualized pneumothorax.  Stable right-sided chest tube. 3. Stable extensive subcutaneous emphysema. Electronically Signed   By: Lajean Manes M.D.   On: 06/21/2019 09:39   DG Chest Port 1 View  Result Date: 06/25/2019 CLINICAL DATA:  Chest tube placement EXAM: PORTABLE CHEST 1 VIEW COMPARISON:  07/14/2019 at 1207 hours FINDINGS: Interval placement of right pigtail pleural drainage catheter. Re-expansion of the right lung with no discernible residual right-sided pneumothorax. Similar prominent interstitial markings bilaterally without new airspace consolidation. Stable cardiomediastinal contours. Extensive bilateral chest wall subcutaneous emphysema, right greater than left. IMPRESSION: 1.  Interval placement of right chest tube with re-expansion of the right lung. No discernible residual right-sided pneumothorax. 2. Extensive bilateral chest wall subcutaneous emphysema, right greater than left. Electronically Signed   By: Davina Poke D.O.   On: 07/11/2019 16:08   DG Chest Portable 1 View  Result Date: 06/18/2019 CLINICAL DATA:  Recent right pneumothorax. Patient left AMA after pulling tube. New onset shortness of breath. EXAM: PORTABLE CHEST 1 VIEW COMPARISON:  06/19/2019.  CT  06/19/2019. FINDINGS: Near complete right pneumothorax is noted. Near complete atelectasis of the right lung. Diffuse interstitial prominence left lung. Heart size stable. Diffuse bilateral chest wall subcutaneous emphysema is again noted. IMPRESSION: Near complete right pneumothorax with atelectasis of the right lung. Critical Value/emergent results were called by telephone at the time of interpretation on 06/19/2019 at 12:39 pm to provider [facility] , who verbally acknowledged these results. Electronically Signed   By: Marcello Moores  Register   On: 07/08/2019 12:42   DG Chest Port 1 View  Result Date: 06/18/2019 CLINICAL DATA:  Right-sided chest tube placement EXAM: PORTABLE CHEST 1 VIEW COMPARISON:  06/18/2019, 8:09 a.m. FINDINGS: Interval placement of right chest pigtail chest tube, with substantial resolution of previously seen moderate right pneumothorax, persistent pneumothorax approximately 10%. The left lung is normally aerated. The heart and mediastinum are unremarkable. IMPRESSION: Interval placement of right chest pigtail chest tube, with substantial resolution of previously seen moderate right pneumothorax, persistent pneumothorax approximately 10%. Electronically Signed   By: Eddie Candle M.D.   On: 06/18/2019 10:57   DG Chest Port 1 View  Result Date: 06/18/2019 CLINICAL DATA:  Shortness of breath status post fall EXAM: PORTABLE CHEST 1 VIEW COMPARISON:  02/17/2014 FINDINGS: Large right pneumothorax  measuring at least 50%. No focal consolidation and pleural effusion. No left pneumothorax. Stable cardiomediastinal silhouette. No aggressive osseous lesion. IMPRESSION: Large right pneumothorax measuring at least 50%. Critical Value/emergent results were called by telephone at the time of interpretation on 06/18/2019 at 8:42 am to provider [facility] , who verbally acknowledged these results. Electronically Signed   By: Kathreen Devoid   On: 06/18/2019 08:43   DG Abd Portable 1V  Result Date: 06/23/2019 CLINICAL DATA:  Evaluate NG tube placement EXAM: PORTABLE ABDOMEN - 1 VIEW COMPARISON:  None FINDINGS: The nasogastric tube tip projects over the expected location of the gastric body. Side port is approximately 5 cm below the GE junction. Air-filled loops of large and small bowel noted. Bilateral airspace opacities. IMPRESSION: NG tube tip projects over the expected location of the gastric body. Electronically Signed   By: Kerby Moors M.D.   On: 06/23/2019 10:43   ECHOCARDIOGRAM COMPLETE  Result Date: 06/21/2019    ECHOCARDIOGRAM REPORT   Patient Name:   KRISTOFER SCHAFFERT Date of Exam: 06/21/2019 Medical Rec #:  [id number]    Height:       69.0 in Accession #:    [id number]   Weight:       175.0 lb Date of Birth:  11-20-1945    BSA:          1.952 m Patient Age:    57 years     BP:           126/47 mmHg Patient Gender: M            HR:           92 bpm. Exam Location:  Inpatient Procedure: 2D Echo Indications:    atrial fibrillation 427.31  History:        Patient has no prior history of Echocardiogram examinations.                 Risk Factors:Former Smoker.  Sonographer:    Johny Chess Referring Phys: [id number] [place]  1. Left ventricular ejection fraction, by estimation, is 45 to 50%. The left ventricle has mildly decreased function. The left ventricle demonstrates global hypokinesis. Left ventricular diastolic parameters were normal.  2. Right ventricular  systolic function is normal. The  right ventricular size is normal. There is normal pulmonary artery systolic pressure. The estimated right ventricular systolic pressure is 31.5 mmHg.  3. Left atrial size was mildly dilated.  4. The mitral valve is normal in structure. Mild to moderate mitral valve regurgitation. No evidence of mitral stenosis.  5. The aortic valve is normal in structure. Aortic valve regurgitation is not visualized. No aortic stenosis is present.  6. The inferior vena cava is normal in size with greater than 50% respiratory variability, suggesting right atrial pressure of 3 mmHg.  7. Poor acoustical images limit accurate assessment of EF and wall motion. Recommend repeat limited study with definity contrast. FINDINGS  Left Ventricle: Left ventricular ejection fraction, by estimation, is 45 to 50%. The left ventricle has mildly decreased function. The left ventricle demonstrates global hypokinesis. The left ventricular internal cavity size was normal in size. There is  no left ventricular hypertrophy. Left ventricular diastolic parameters were normal. Normal left ventricular filling pressure. Right Ventricle: The right ventricular size is normal. No increase in right ventricular wall thickness. Right ventricular systolic function is normal. There is normal pulmonary artery systolic pressure. The tricuspid regurgitant velocity is 2.51 m/s, and  with an assumed right atrial pressure of 3 mmHg, the estimated right ventricular systolic pressure is 40.0 mmHg. Left Atrium: Left atrial size was mildly dilated. Right Atrium: Right atrial size was normal in size. Pericardium: There is no evidence of pericardial effusion. Mitral Valve: The mitral valve is normal in structure. Normal mobility of the mitral valve leaflets. Mild to moderate mitral valve regurgitation. No evidence of mitral valve stenosis. Tricuspid Valve: The tricuspid valve is normal in structure. Tricuspid valve regurgitation is mild . No evidence of tricuspid stenosis. Aortic  Valve: The aortic valve is normal in structure. Aortic valve regurgitation is not visualized. No aortic stenosis is present. Pulmonic Valve: The pulmonic valve was normal in structure. Pulmonic valve regurgitation is not visualized. No evidence of pulmonic stenosis. Aorta: The aortic root is normal in size and structure. Venous: The inferior vena cava is normal in size with greater than 50% respiratory variability, suggesting right atrial pressure of 3 mmHg. IAS/Shunts: The interatrial septum appears to be lipomatous. No atrial level shunt detected by color flow Doppler.  LEFT VENTRICLE PLAX 2D LVIDd:         4.90 cm  Diastology LVIDs:         3.80 cm  LV e' lateral:   12.80 cm/s LV PW:         1.00 cm  LV E/e' lateral: 7.6 LV IVS:        0.80 cm  LV e' medial:    8.49 cm/s LVOT diam:     1.70 cm  LV E/e' medial:  11.4 LV SV:         51 LV SV Index:   26 LVOT Area:     2.27 cm  RIGHT VENTRICLE             IVC RV S prime:     14.70 cm/s  IVC diam: 1.80 cm TAPSE (M-mode): 2.0 cm LEFT ATRIUM             Index       RIGHT ATRIUM           Index LA diam:        3.30 cm 1.69 cm/m  RA Area:     11.40 cm LA Vol (A2C):   81.4  ml 41.70 ml/m RA Volume:   26.40 ml  13.52 ml/m LA Vol (A4C):   50.3 ml 25.77 ml/m LA Biplane Vol: 67.2 ml 34.42 ml/m  AORTIC VALVE LVOT Vmax:   115.00 cm/s LVOT Vmean:  78.600 cm/s LVOT VTI:    0.223 m MITRAL VALVE               TRICUSPID VALVE MV Area (PHT): 4.36 cm    TR Peak grad:   25.2 mmHg MV Decel Time: 174 msec    TR Vmax:        251.00 cm/s MV E velocity: 96.70 cm/s MV A velocity: 92.00 cm/s  SHUNTS MV E/A ratio:  1.05        Systemic VTI:  0.22 m                            Systemic Diam: 1.70 cm Fransico Him MD Electronically signed by Fransico Him MD Signature Date/Time: 06/21/2019/4:35:07 PM    Final    ECHOCARDIOGRAM LIMITED  Result Date: 06/23/2019    ECHOCARDIOGRAM LIMITED REPORT   Patient Name:   PAMELA MADDY Date of Exam: 06/23/2019 Medical Rec #:  [id number]    Height:        74.0 in Accession #:    [id number]   Weight:       175.0 lb Date of Birth:  Jul 10, 1945    BSA:          2.054 m Patient Age:    49 years     BP:           92/46 mmHg Patient Gender: M            HR:           114 bpm. Exam Location:  Inpatient Procedure: Limited Echo and Intracardiac Opacification Agent Indications:    Abnormal Echocardiogram [id number]  History:        Patient has prior history of Echocardiogram examinations, most                 recent 06/21/2019. Risk Factors:Dyslipidemia.  Sonographer:    Jonelle Sidle Dance Referring Phys: [address] [place]  1. Limited definity contrast study for LVEF/wall motion.  2. Left ventricular ejection fraction, by estimation, is 45 to 50%. The left ventricle has mildly decreased function. There is moderate hypokinesis of the left ventricular, mid-apical inferior wall and inferoseptal wall. Comparison(s): No significant change from prior study. 06/21/19: LVEF 45-50%, global HK. FINDINGS  Left Ventricle: Left ventricular ejection fraction, by estimation, is 45 to 50%. The left ventricle has mildly decreased function. Moderate hypokinesis of the left ventricular, mid-apical inferior wall and inferoseptal wall. Definity contrast agent was given IV to delineate the left ventricular endocardial borders. Lyman Bishop MD Electronically signed by Lyman Bishop MD Signature Date/Time: 06/23/2019/1:04:04 PM    Final     Microbiology No results found for this or any previous visit (from the past 240 hour(s)).  Lab Basic Metabolic Panel: No results for input(s): NA, K, CL, CO2, GLUCOSE, BUN, CREATININE, CALCIUM, MG, PHOS in the last 168 hours. Liver Function Tests: No results for input(s): AST, ALT, ALKPHOS, BILITOT, PROT, ALBUMIN in the last 168 hours. No results for input(s): LIPASE, AMYLASE in the last 168 hours. No results for input(s): AMMONIA in the last 168 hours. CBC: No results for input(s): WBC, NEUTROABS, HGB, HCT, MCV, PLT in the last 168 hours. Cardiac  Enzymes: No  results for input(s): CKTOTAL, CKMB, CKMBINDEX, TROPONINI in the last 168 hours. Sepsis Labs: No results for input(s): PROCALCITON, WBC, LATICACIDVEN in the last 168 hours.  Procedures/Operations  R chest tube placement, intubation   Anyesha N Kervin Bones 07/07/2019, 9:04 PM

## 2019-07-17 NOTE — Progress Notes (Signed)
Wasted approximate 82ml of dilaudid gtt with Brooke Bonito in stericycle.

## 2019-07-17 NOTE — Progress Notes (Signed)
Trauma/Critical Care Follow Up Note  Subjective:    Overnight Issues:   Objective:  Vital signs for last 24 hours: Temp:  [99.4 F (37.4 C)-101.7 F (38.7 C)] 101 F (38.3 C) (06/13 0800) Pulse Rate:  [60-66] 61 (06/13 0800) Resp:  [15-21] 16 (06/13 0800) BP: (98-132)/(50-79) 128/52 (06/13 0800) SpO2:  [93 %-100 %] 95 % (06/13 0816) FiO2 (%):  [40 %] 40 % (06/13 0816)  Hemodynamic parameters for last 24 hours:    Intake/Output from previous day: 06/12 0701 - 06/13 0700 In: 1355.9 [I.V.:1319.4; NG/GT:36.5] Out: 3100 [Urine:2900; Emesis/NG output:200]  Intake/Output this shift: Total I/O In: 67.4 [I.V.:67.4] Out: -   Vent settings for last 24 hours: Vent Mode: PRVC FiO2 (%):  [40 %] 40 % Set Rate:  [16 bmp] 16 bmp Vt Set:  [761 mL] 660 mL PEEP:  [5 cmH20] 5 cmH20 Plateau Pressure:  [27 cmH20-33 cmH20] 29 cmH20  Physical Exam:  Gen: comfortable, no distress Neuro: grossly non-focal, does not follow commands HEENT: intubated Neck: supple CV: RRR Pulm: unlabored breathing, mechanically ventilated Abd: soft, nontender GU: clear, yellow urine Extr: wwp, no edema   Results for orders placed or performed during the hospital encounter of 07/01/2019 (from the past 24 hour(s))  Glucose, capillary     Status: Abnormal   Collection Time: 06/28/19 11:59 AM  Result Value Ref Range   Glucose-Capillary 185 (H) 70 - 99 mg/dL  Glucose, capillary     Status: Abnormal   Collection Time: 06/28/19  3:38 PM  Result Value Ref Range   Glucose-Capillary 128 (H) 70 - 99 mg/dL  Glucose, capillary     Status: Abnormal   Collection Time: 06/28/19  7:26 PM  Result Value Ref Range   Glucose-Capillary 124 (H) 70 - 99 mg/dL  Glucose, capillary     Status: Abnormal   Collection Time: 06/28/19 11:19 PM  Result Value Ref Range   Glucose-Capillary 102 (H) 70 - 99 mg/dL  Glucose, capillary     Status: Abnormal   Collection Time: 07/12/19  3:51 AM  Result Value Ref Range    Glucose-Capillary 127 (H) 70 - 99 mg/dL  Glucose, capillary     Status: Abnormal   Collection Time: 12-Jul-2019  8:07 AM  Result Value Ref Range   Glucose-Capillary 122 (H) 70 - 99 mg/dL    Assessment & Plan: The plan of care was discussed with the bedside nurse for the day, who is in agreement with this plan and no additional concerns were raised.   Present on Admission:  Pneumothorax on right    LOS: 9 days   Additional comments:I reviewed the patient's new clinical lab test results.   and I reviewed the patients new imaging test results.    ?Fall  R PTX-s/pCT,no PTX andnoairleak, WS6/9,removed A fib with RVR, new onset-Amio per Cardiology HLD- home meds ETOH use-CIWA COPD -pulmicort Acute hypoxic ventilator dependent respiratory failure-full support, weaning, compassionate extubation today Tobacco abuse Fever, PNA -completedrocephin/azithromycin for CAP.Resp cxgrowing yeast so add Diflucan and plan 7d Encephalopathy -likely2/2 alcohol withdrawal.  FEN -NPO due to emesis, propofol, fent, precedex,seroquel,clonazepam VTE-SCDs, lovenox Dispo- ICU, compassionate extubation this AM per family's wishes  Critical Care Total Time: 35 minutes  Jesusita Oka, MD Trauma & General Surgery Please use [email or web address] to contact on call provider  Jul 12, 2019  *Care during the described time interval was provided by me. I have reviewed this patient's available data, including medical history, events of note, physical examination and test results  as part of my evaluation.

## 2019-07-17 NOTE — Progress Notes (Signed)
Post extubation, daughter Lexine Baton returned to patient bedside.  Patient not in any distress. No respirations noted. Charge RN Beacher May entered and this RN Anderson Malta RN pronounced patient expiration @ 1032.  Nikki and son Randall Hiss at bedside at this time.  Other family in waiting room notified by chaplain. Lovick MD notified as well as [facility].

## 2019-07-17 DEATH — deceased

## 2019-07-30 DIAGNOSIS — R0989 Other specified symptoms and signs involving the circulatory and respiratory systems: Secondary | ICD-10-CM

## 2021-10-23 IMAGING — DX DG CHEST 1V PORT
1 series · 1 of 1 positions shown · non-contrast
Comparison: Same day at 0720 hours and CT chest 06/19/2019.

CLINICAL DATA: Endotracheal tube placement.

EXAM:
PORTABLE CHEST 1 VIEW

[chest]
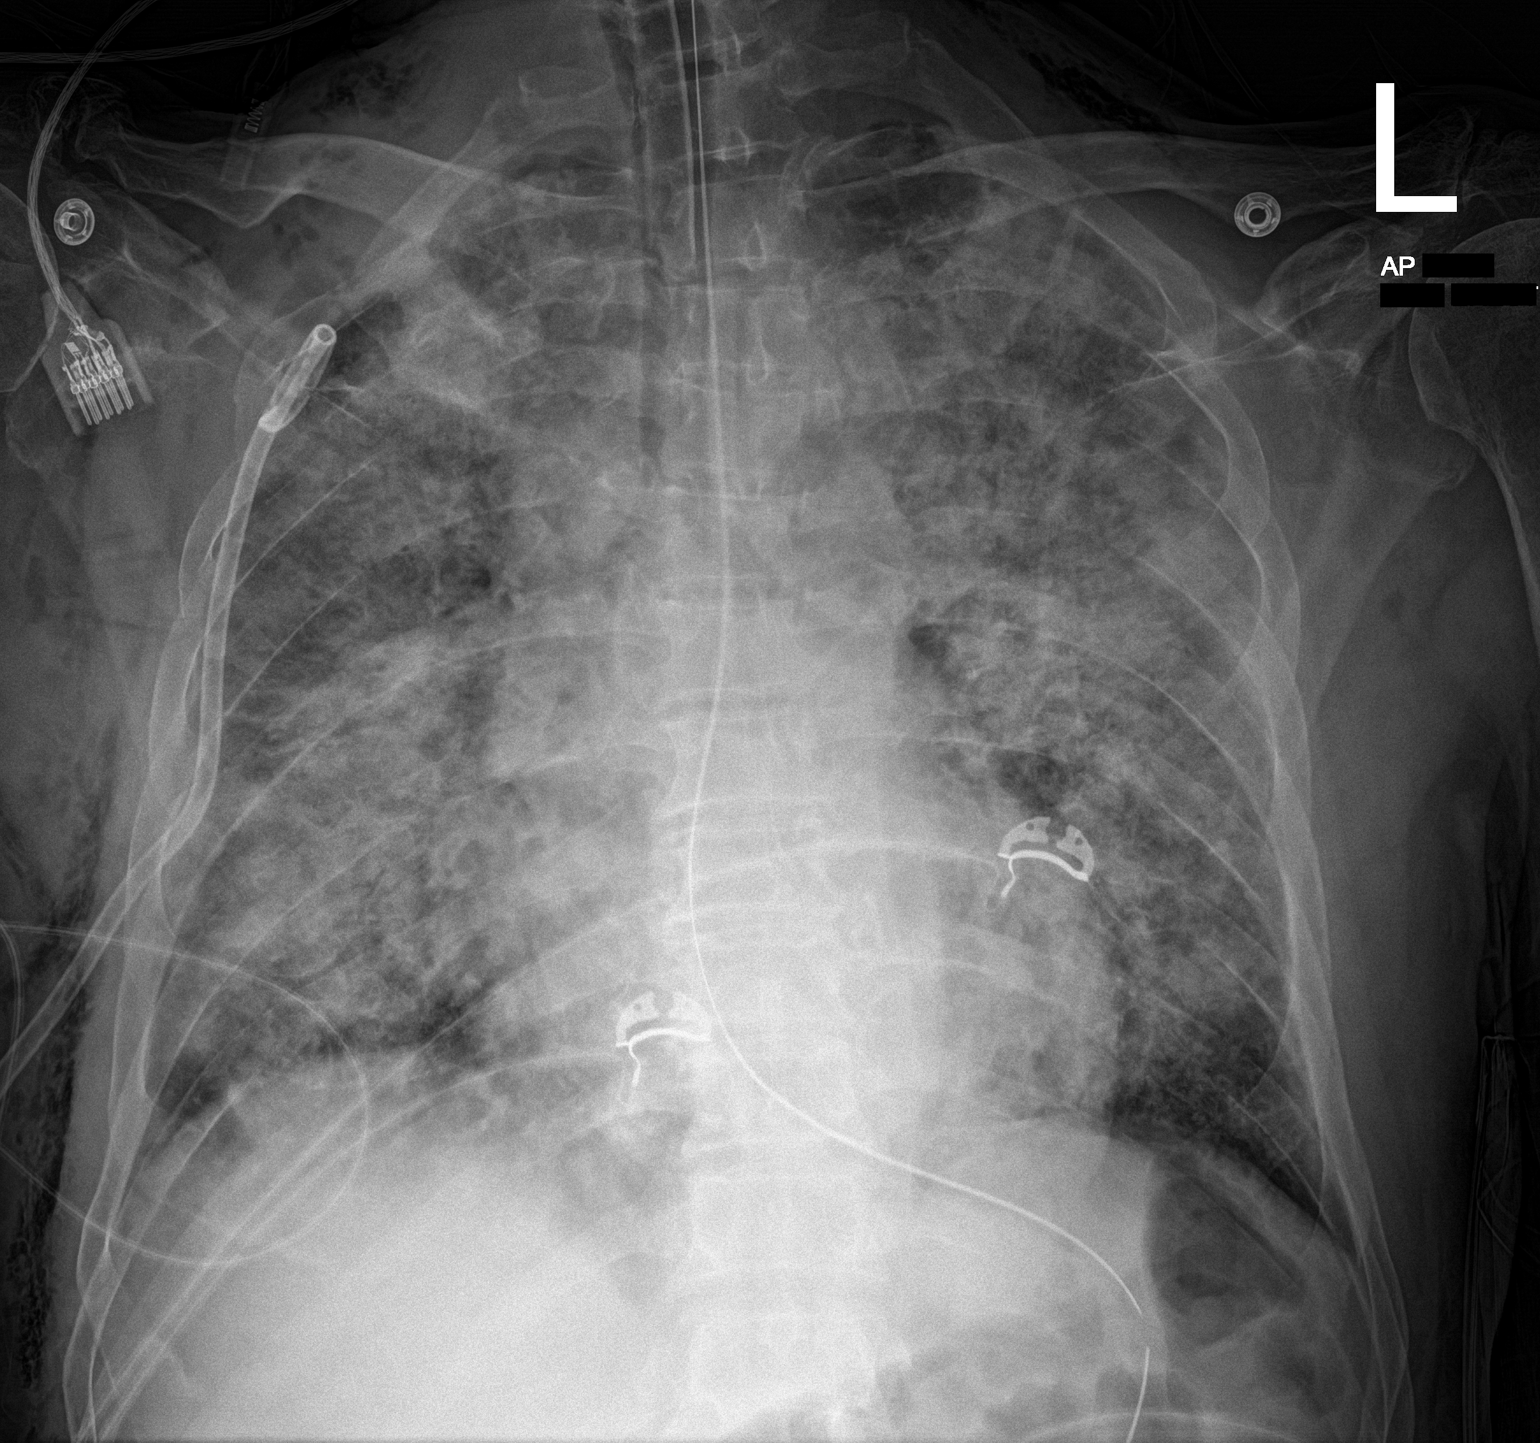

[1 of 1 positions shown; findings below may reference images not displayed]

FINDINGS: Endotracheal tube terminates 6.1 cm above the carina. Nasogastric
tube terminates in the stomach. Heart size stable. Severe diffuse
airspace opacification persists. Pigtail catheter is seen in the
lateral aspect of the apical right hemithorax. No definite
pneumothorax. Subcutaneous emphysema in the neck bilaterally and
right chest wall. No pleural fluid.
IMPRESSION: 1. Satisfactory endotracheal tube position.
2. Severe diffuse bilateral airspace opacification may be due to
edema or pneumonia. Adult respiratory distress syndrome is also
considered, in the appropriate clinical setting.
3. Right chest tube in place.  No definite pneumothorax.

## 2021-10-25 IMAGING — DX DG CHEST 1V PORT
1 series · 1 of 1 positions shown · non-contrast
Comparison: Chest x-ray 06/24/2019.

CLINICAL DATA: 74-year-old male with history of respiratory
failure.

EXAM:
PORTABLE CHEST 1 VIEW

[chest ap]
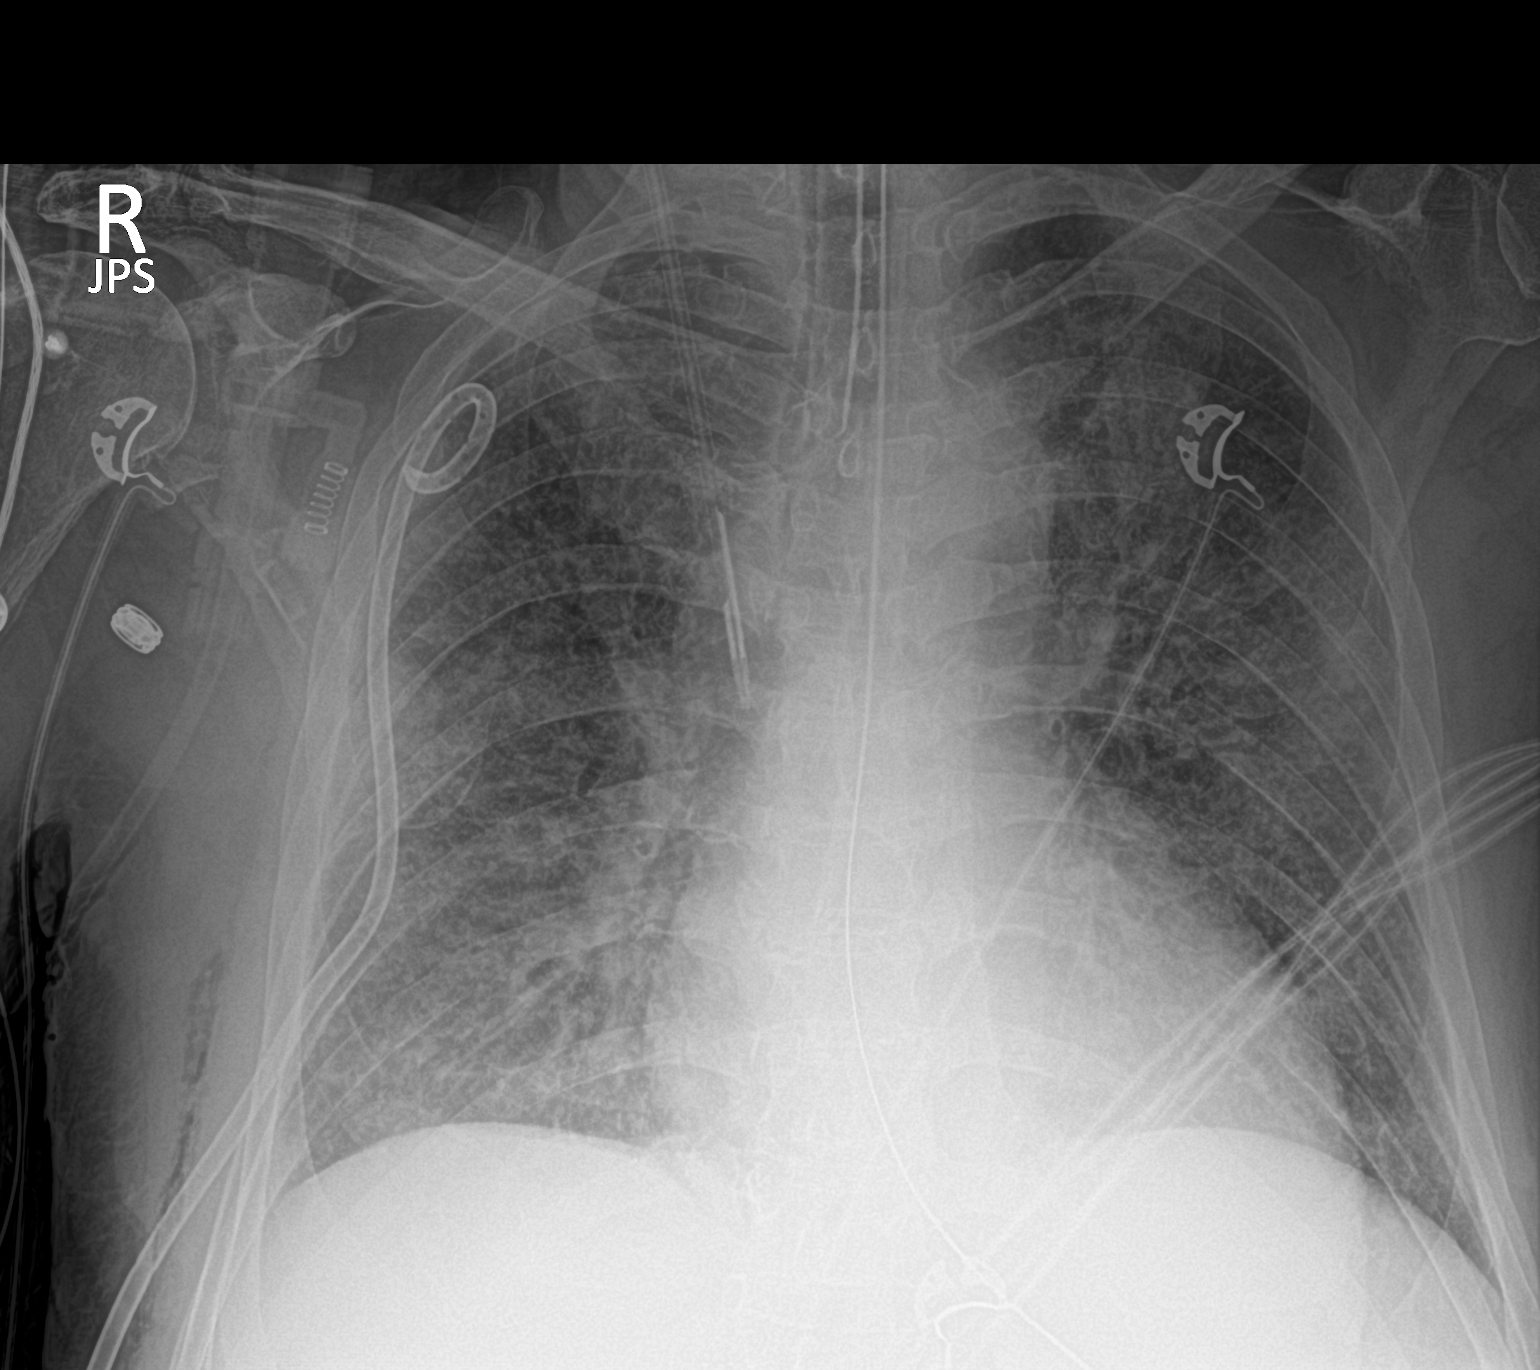

[1 of 1 positions shown; findings below may reference images not displayed]

FINDINGS: An endotracheal tube is in place with tip 5.5 cm above the carina.
There is a right-sided internal jugular central venous catheter with
tip terminating in the distal superior vena cava. A nasogastric tube
is seen extending into the stomach, however, the tip of the
nasogastric tube extends below the lower margin of the image.
Right-sided chest tube in position with pigtail reformed in the
lateral right upper hemithorax. No appreciable pneumothorax
identified. Diffuse interstitial prominence noted throughout all
aspects of the lungs bilaterally. Patchy ill-defined airspace
disease also noted scattered throughout the lungs bilaterally,
without frank confluence. No pleural effusions. Pulmonary
vasculature is partially obscured. Heart size is upper limits of
normal. Upper mediastinal contours are within normal limits. Small
amount of subcutaneous emphysema in the right lateral chest wall.
IMPRESSION: 1. Support apparatus, as above.
2. Diffuse interstitial and airspace disease throughout the lungs
bilaterally, similar to the prior study, presumably of infectious or
inflammatory etiology.
3. No definite residual right-sided pneumothorax.

## 2021-10-27 IMAGING — DX DG CHEST 1V PORT
1 series · 2 of 2 positions shown · non-contrast
Comparison: Yesterday

CLINICAL DATA: Follow-up pneumothorax on the right

EXAM:
PORTABLE CHEST 1 VIEW

[Series 1: chest · 0.14mm/px · 2 of 2 slices shown]
[im 1/2]
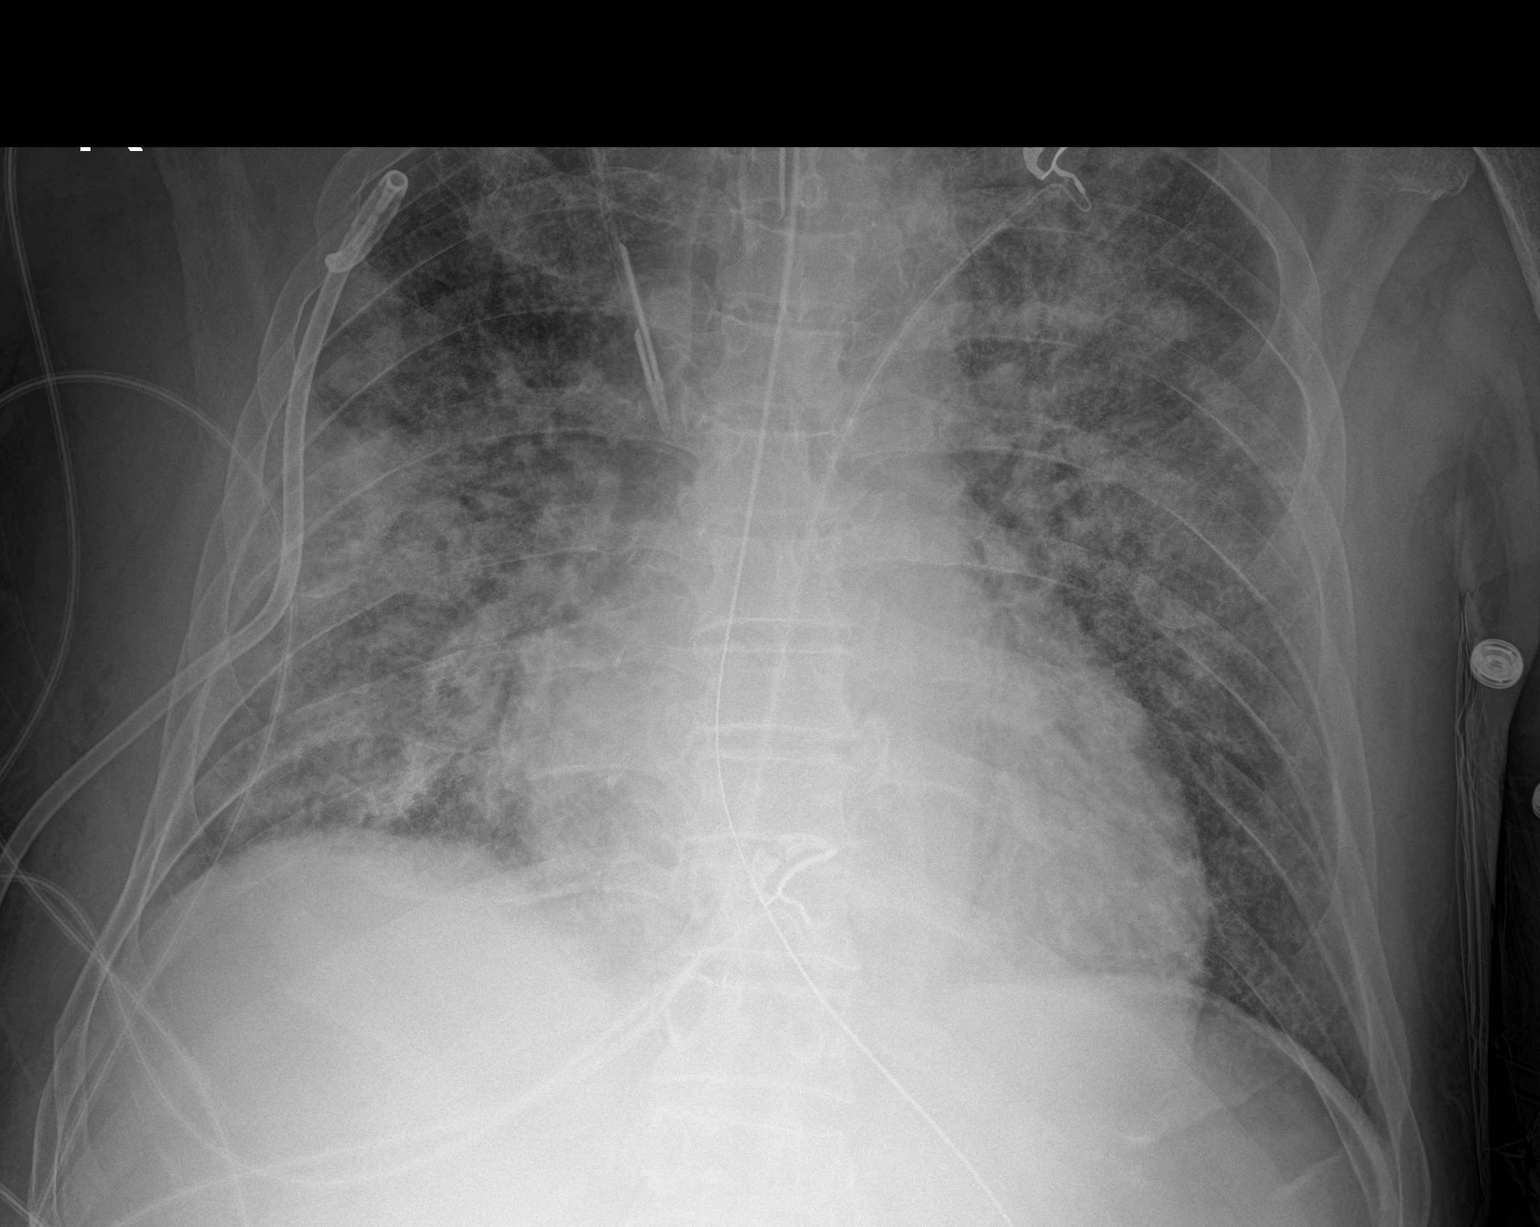
[im 2/2]
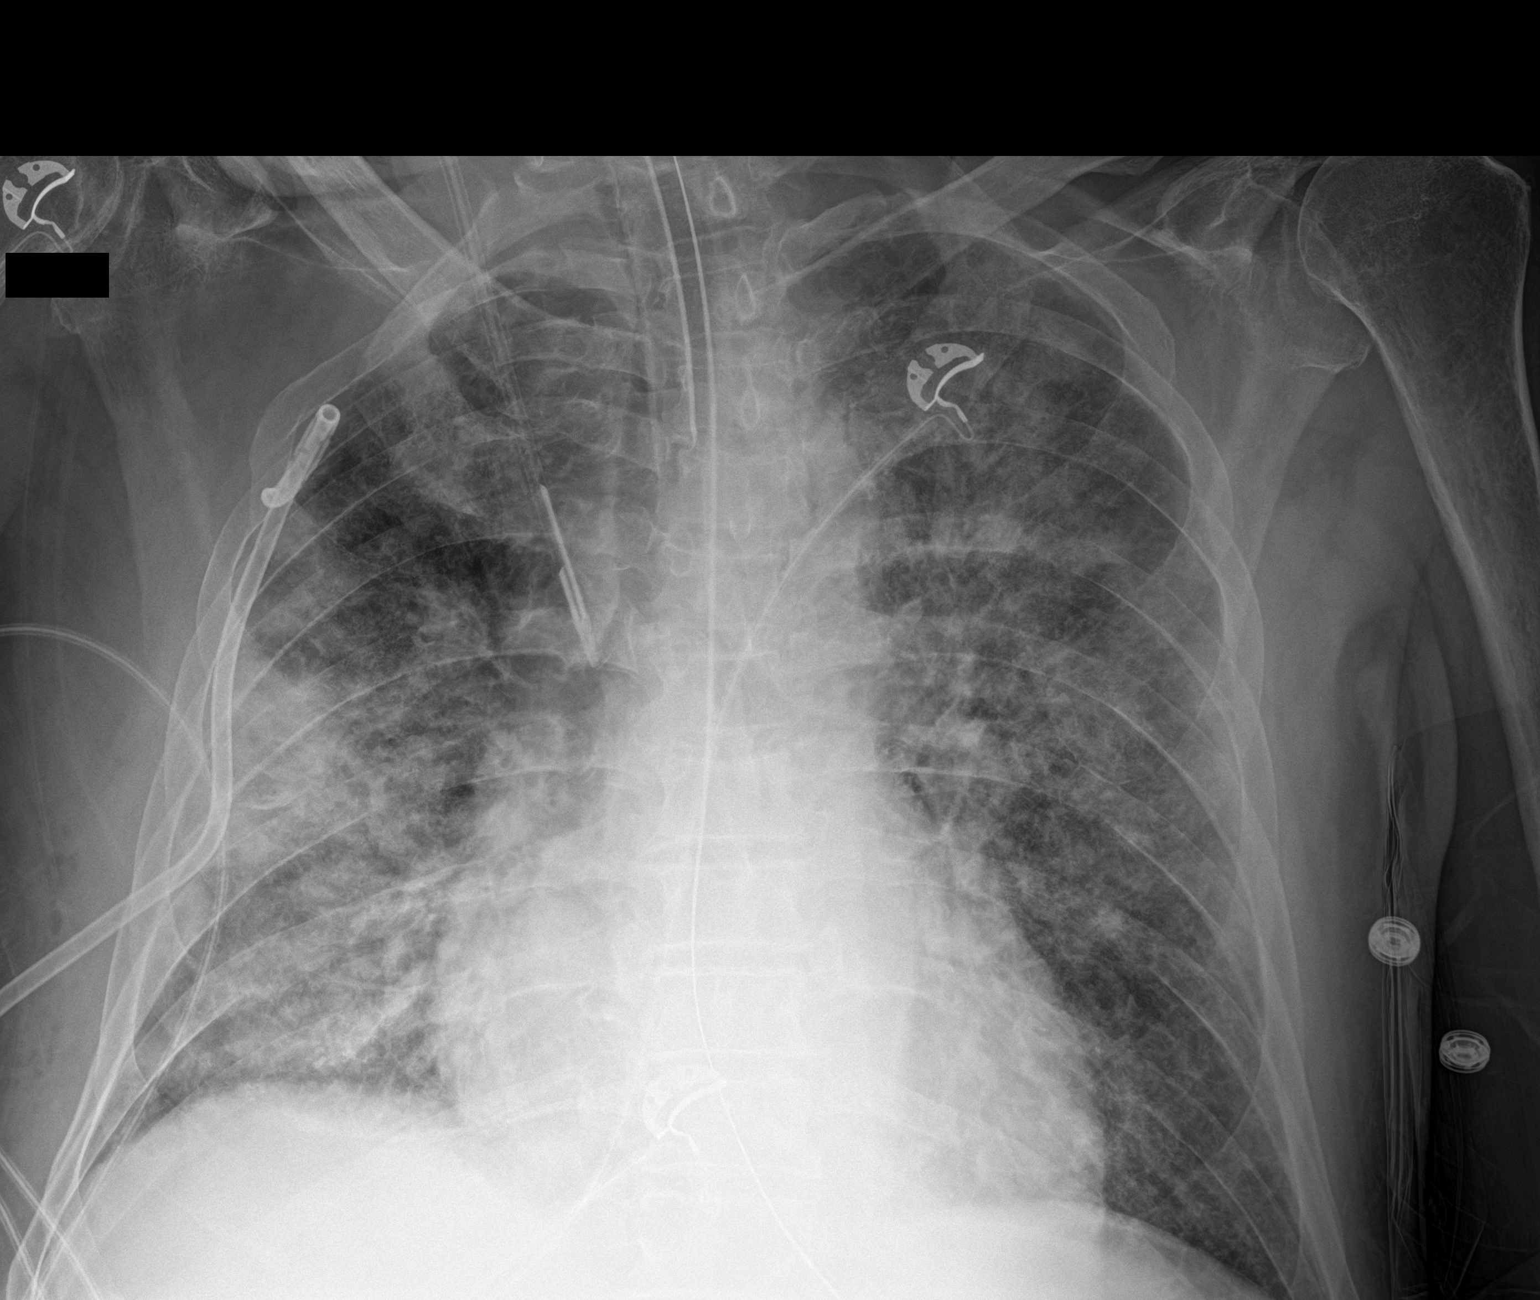

[2 of 2 positions shown; findings below may reference images not displayed]

FINDINGS: Cardiomegaly and mild vascular pedicle widening. The enteric,
endotracheal, and chest tubes remain in good position. Right IJ line
with tip at the SVC. Unchanged diffuse pulmonary infiltrates. No
visible pneumothorax on the second better position view.
IMPRESSION: Stable hardware positioning and bilateral infiltrates. No convincing
pneumothorax.
